# Patient Record
Sex: Male | Born: 1955
Health system: Southern US, Community
[De-identification: ages and names within clinical notes are randomized; demographics above are authoritative.]

## PROBLEM LIST (undated history)

## (undated) DIAGNOSIS — Z77090 Contact with and (suspected) exposure to asbestos: Secondary | ICD-10-CM

## (undated) DIAGNOSIS — C801 Malignant (primary) neoplasm, unspecified: Secondary | ICD-10-CM

## (undated) DIAGNOSIS — F32A Depression, unspecified: Secondary | ICD-10-CM

## (undated) DIAGNOSIS — I2510 Atherosclerotic heart disease of native coronary artery without angina pectoris: Secondary | ICD-10-CM

## (undated) DIAGNOSIS — M1990 Unspecified osteoarthritis, unspecified site: Secondary | ICD-10-CM

## (undated) DIAGNOSIS — Z87442 Personal history of urinary calculi: Secondary | ICD-10-CM

## (undated) DIAGNOSIS — K219 Gastro-esophageal reflux disease without esophagitis: Secondary | ICD-10-CM

## (undated) DIAGNOSIS — R0600 Dyspnea, unspecified: Secondary | ICD-10-CM

---

## 1988-08-16 HISTORY — PX: NECK SURGERY: SHX720

## 2000-08-16 HISTORY — PX: FRACTURE SURGERY: SHX138

## 2008-12-06 ENCOUNTER — Ambulatory Visit (HOSPITAL_COMMUNITY): Admission: RE | Admit: 2008-12-06 | Discharge: 2008-12-06 | Payer: Self-pay | Admitting: Urology

## 2010-06-28 IMAGING — RF DG RETROGRADE PYELOGRAM
1 series · 1 of 1 positions shown · non-contrast
Comparison: None available.

CLINICAL DATA: Left ureteral calculus.  Double-J stent placement.

RETROGRADE PYELOGRAM

[Series 1: run · 1 of 1 slices shown]
[im 1/1]
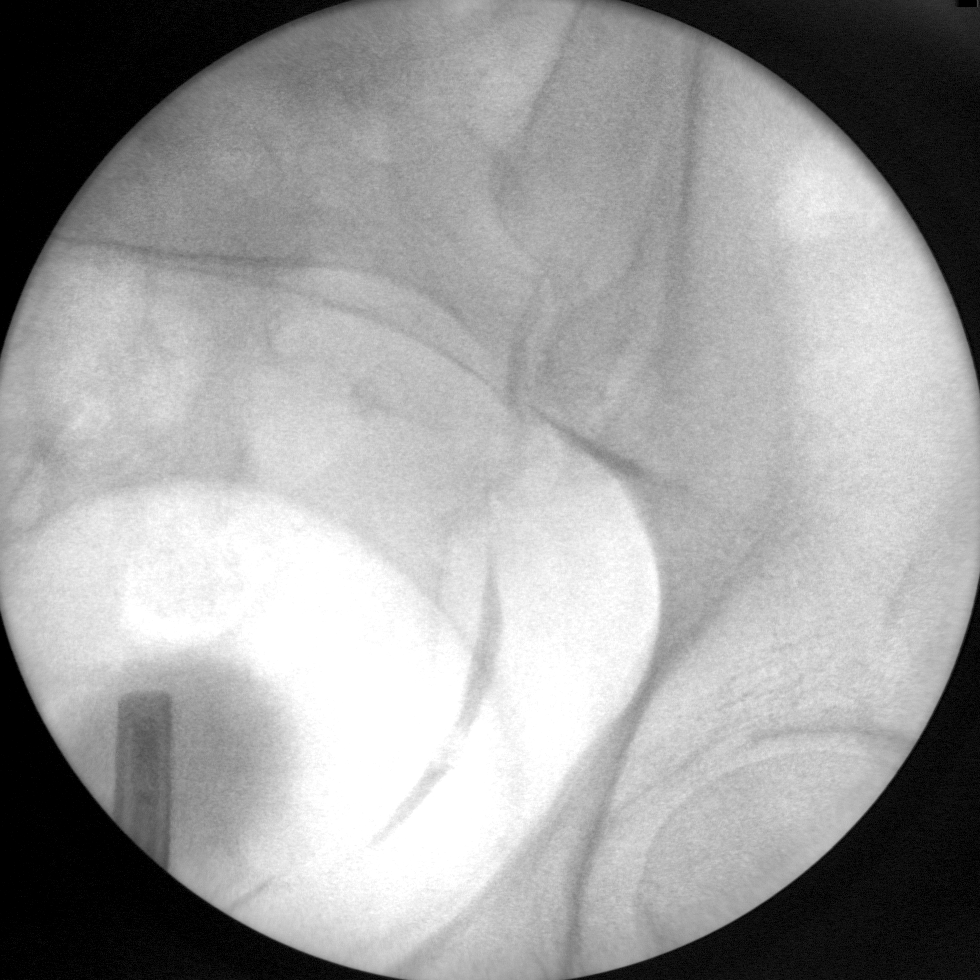

[1 of 1 positions shown; findings below may reference images not displayed]

FINDINGS: Fluoroscopic spot view from retrograde pyelogram
demonstrates a filling defect in the distal left ureter consistent
with a small stone.  Final image shows a double J left ureteral
stent in good position.  No stone is identified.
IMPRESSION: Left ureteral stone removal and stent placement as above.

## 2010-11-25 LAB — CBC
Platelets: 204 10*3/uL (ref 150–400)
RBC: 4.57 MIL/uL (ref 4.22–5.81)
WBC: 8.5 10*3/uL (ref 4.0–10.5)

## 2010-11-25 LAB — BASIC METABOLIC PANEL
BUN: 12 mg/dL (ref 6–23)
Calcium: 9.1 mg/dL (ref 8.4–10.5)
Chloride: 100 mEq/L (ref 96–112)
Creatinine, Ser: 0.75 mg/dL (ref 0.4–1.5)
GFR calc Af Amer: >60 mL/min (ref >60)
GFR calc non Af Amer: >60 mL/min (ref >60)

## 2010-12-29 NOTE — Op Note (Signed)
NAME:  Christian Davis, Christian Davis                ACCOUNT NO.:  [id number]   MEDICAL RECORD NO.:  [id number]          PATIENT TYPE:  AMB   LOCATION:  DAY                           FACILITY:  [facility]   PHYSICIAN:  Ky Barban, M.D.DATE OF BIRTH:  1956-01-30   DATE OF PROCEDURE:  12/06/2008  DATE OF DISCHARGE:                               OPERATIVE REPORT   PREOPERATIVE DIAGNOSES:  Left ureteral calculus, questionable bladder  calculus.   POSTOPERATIVE DIAGNOSES:  Left ureteral calculus, questionable bladder  calculus.   PROCEDURES:  Cystoscopy, left retrograde pyelogram, ureteroscopic stone  basket extraction, holmium laser lithotripsy, insertion of double-J  stent with string attached, and also removal of bladder calculus.   PROCEDURE IN DETAIL:  The patient under spinal anesthesia in lithotomy  position.  After usual prep and drape, #25 cystoscope was introduced  into the bladder.  It was inspected.  There was a small bladder calculus  floating around in the bladder.  Left ureteral orifice was catheterized  with a wedge catheter.  Hypaque was injected under fluoroscopic control.  Dye goes up into the upper ureter.  There was a filling defect in the  ureterovesical junction.  Now, guidewire was passed up into the renal  pelvis and over the guidewire #15 balloon dilator was inserted.  Intramural ureter was dilated to #15.  Cystoscope was removed and  ureteroscope was introduced alongside the guidewire, went to the level  of the stone.  Stone was then visualized.  I passed a basket above the  stone and now using holmium laser fiber, the stone was broken under  direct vision and then the pieces were engaged in the basket and  removed.  No complications and at the end, I removed the bladder stone  with a flexible forceps.  Cystoscope was removed and stent size 5-French  24 cm was introduced.  I left the string attached.  It was positioned  between the renal pelvis and the bladder.  Guidewire  was removed and  nice loop in the renal pelvis and bladder was obtained.  All the  instruments were removed.  The patient left the operating room in  satisfactory condition.      Ky Barban, M.D.  Electronically Signed     MIJ/MEDQ  D:  12/06/2008  T:  12/06/2008  Job:  [id number]

## 2010-12-29 NOTE — H&P (Signed)
NAME:  Christian Davis, Christian Davis                ACCOUNT NO.:  [id number]   MEDICAL RECORD NO.:  [id number]          PATIENT TYPE:  AMB   LOCATION:  DAY                           FACILITY:  [facility]   PHYSICIAN:  Ky Barban, M.D.DATE OF BIRTH:  Dec 27, 1955   DATE OF ADMISSION:  DATE OF DISCHARGE:  LH                              HISTORY & PHYSICAL   CHIEF COMPLAIN:  Recurrent left renal colic.   HISTORY:  This 55 year old gentleman came to see me because he is having  recurrent episodes of left renal colic.  He had first episode of left  renal colic 10 days ago.  He is still having pain.  He had to go to the  emergency room.  On that day, a CT scan was done, showed a 6-mm stone in  the distal left ureter and the calcification is 6-mm in size, it is  about 1.2 cm from the left ureterovesical junction.  Also, a  questionable 2.5-mm posterior bladder calcification.  The patient is  still having pain, although not very significant, but he is planning to  go out of town in a couple of weeks, he wants something done about it so  that is the only reason I stated that although the stone is on the large  side it will be difficult to pass but I have seen that size stone,  sometimes they come out if the patient waits long enough, but he does  not want to wait because he has to go out of town.  So, I have scheduled  him to undergo cystoscopy, left retrograde pyelogram, ureteroscopic  stone basket extraction.  I discussed the procedure, its limitations,  complications, especially ureteral perforation leading to open surgery  which will require longer hospitalization, longer time to recuperate but  it is not a very common problem, but it is a possibility.  He may end up  also having a double-J stent which needs to stay there for a couple of  days at least and he understands, wants me to go ahead and proceed, so  he is coming as outpatient in the morning.  Will do a stone basket under  anesthesia with holmium  laser lithotripsy.   PAST MEDICAL HISTORY:  1. No history of diabetes or hypertension.  2. Cervical spine fusion in 1999 and broken left leg 5-6 years ago.      Still has 1 broken cervical vertebrae and is having physiotherapy.      He complains of numbness in his left arm because of that.   PERSONAL HISTORY:  He does not smoke or drink.   REVIEW OF SYSTEMS:  Unremarkable.   EXAMINATION:  Blood pressure 100/67, temperature 98.7.  ABDOMEN:  Soft, flat.  Liver, spleen, kidneys not palpable.  No CVA  tenderness.  EXTERNAL GENITALIA:  Circumcised, meatus adequate.  Testicles are  normal.  RECTAL:  Normal sphincter tone.  No rectal mass.  Prostate 1+, smooth  and firm.   IMPRESSION:  Left ureteral calculus 6-mm in size, possible small 2.5-mm  stone in the bladder.   PLAN:  1. Cystoscopy.  2. Left retrograde pyelogram.  3. Ureteroscopic stone basket extraction.  If the stone is there in      the bladder I will have to remove that also.  I have discussed the      procedure, limitations, complications with the patient, he      understands.      Ky Barban, M.D.  Electronically Signed     MIJ/MEDQ  D:  12/05/2008  T:  12/05/2008  Job:  [id number]

## 2015-12-24 DIAGNOSIS — Z0000 Encounter for general adult medical examination without abnormal findings: Secondary | ICD-10-CM | POA: Diagnosis not present

## 2016-01-05 DIAGNOSIS — J300 Vasomotor rhinitis: Secondary | ICD-10-CM | POA: Diagnosis not present

## 2016-02-06 DIAGNOSIS — R0981 Nasal congestion: Secondary | ICD-10-CM | POA: Diagnosis not present

## 2016-02-06 DIAGNOSIS — F410 Panic disorder [episodic paroxysmal anxiety] without agoraphobia: Secondary | ICD-10-CM | POA: Diagnosis not present

## 2016-02-06 DIAGNOSIS — F419 Anxiety disorder, unspecified: Secondary | ICD-10-CM | POA: Diagnosis not present

## 2016-02-06 DIAGNOSIS — M791 Myalgia: Secondary | ICD-10-CM | POA: Diagnosis not present

## 2016-02-11 ENCOUNTER — Ambulatory Visit (INDEPENDENT_AMBULATORY_CARE_PROVIDER_SITE_OTHER): Payer: BLUE CROSS/BLUE SHIELD | Admitting: Family Medicine

## 2016-02-11 VITALS — BP 126/84 | Wt 247.4 lb

## 2016-02-11 DIAGNOSIS — G250 Essential tremor: Secondary | ICD-10-CM | POA: Diagnosis not present

## 2016-02-11 DIAGNOSIS — F329 Major depressive disorder, single episode, unspecified: Secondary | ICD-10-CM | POA: Diagnosis not present

## 2016-02-11 DIAGNOSIS — R0981 Nasal congestion: Secondary | ICD-10-CM | POA: Diagnosis not present

## 2016-02-11 DIAGNOSIS — F32A Depression, unspecified: Secondary | ICD-10-CM

## 2016-02-11 MED ORDER — AZITHROMYCIN 250 MG PO TABS: ORAL_TABLET | ORAL | Status: DC

## 2016-02-11 NOTE — Progress Notes (Signed)
   Subjective:    Patient ID: Christian Davis, male    DOB: 07-21-1956, 60 y.o.   MRN: [id number]  HPI Patient arrives for a ER follow up for panic attack. Patient also having problems with allergies and drainage.-using OTC nasal spray  Wakes up meddle of night, bp up, disoriented, has trouble with orient ation  Worked [employer] for thirty yrs and then injured back and had to retire   On celexa long term for this then tried to wean   Pt awoke in middle of night, couldn't sit couldn't stand, drove around   Confused, trouble with orientation, trouble with complete sentences  No tests done, offerred full work up , given test to [employer] hx of dementia in the family, fairly strong, some get it, some do not  troubl ith memeory for the lsast six to eight months  Told other do c about it  Long hx of allergies and ent issues, sees specialist for it, chronic nasal vong and xtuffiness. Has dx of asbestosis, gets pft s and studies to ck this, hx of allergy shots, took numerous allergy shots  In the past. rec considering dr Jefferson Fuel an ent   Now on otc nasocort and allegra prn, on occasion  Patient has been on generic Celexa for some time. Has more difficulty with anxiety and nerves during the day. Also notes challenges with increased memory issues. Was advised by his last family doctor not to worry about it is getting older  Also has history of tremor. Felt to be essential tremor by his prior neurosurgeon. Next  History of neck surgery in the past.    Review of Systems No headache no chest pain no back pain no abdominal pain no change bowel habits    Objective:   Physical Exam  Alert vital stable HET moderate nasal congestion TMs good pharynx normal neck supple. Lungs clear heart rare rhythm slight tremor noted.      Assessment & Plan:  Impression 1 chronic nasal congestion history of ENT surgical intervention was steroid injection of the turbinates. #2 sleep disturbance with  unusual somnambulism type episodes sporadically once per year or so. #3 chronic anxiety, depression #4 worsening short-term memory #5 probable essential tremor plan ENT referral per patient request. Follow-up in a couple weeks. We'll do an MMSE along wit sets pulmonary issue not be happy to go on that yet R forth interactions and I h neurological exam at that point and then try to figure some this out WSL  C

## 2016-02-20 ENCOUNTER — Ambulatory Visit (INDEPENDENT_AMBULATORY_CARE_PROVIDER_SITE_OTHER): Payer: BLUE CROSS/BLUE SHIELD | Admitting: Family Medicine

## 2016-02-20 ENCOUNTER — Encounter: Payer: Self-pay | Admitting: Family Medicine

## 2016-02-20 VITALS — BP 114/74 | Ht 72.0 in | Wt 246.0 lb

## 2016-02-20 DIAGNOSIS — R0981 Nasal congestion: Secondary | ICD-10-CM | POA: Diagnosis not present

## 2016-02-20 DIAGNOSIS — R413 Other amnesia: Secondary | ICD-10-CM | POA: Insufficient documentation

## 2016-02-20 DIAGNOSIS — G250 Essential tremor: Secondary | ICD-10-CM | POA: Diagnosis not present

## 2016-02-20 DIAGNOSIS — F514 Sleep terrors [night terrors]: Secondary | ICD-10-CM | POA: Diagnosis not present

## 2016-02-20 MED ORDER — CITALOPRAM HYDROBROMIDE 20 MG PO TABS: 20.0000 mg | ORAL_TABLET | Freq: Every day | ORAL | Status: DC

## 2016-02-20 NOTE — Progress Notes (Signed)
   Subjective:    Patient ID: Christian Davis, male    DOB: 20-Apr-1956, 60 y.o.   MRN: [id number]  Depression        This is a new problem.  The current episode started more than 1 month ago.    Patient in today for a follow up for depression, and chronic anxiety.   Mos sode has all had [place] including mother  All siblings  In eighties with siblings.. Next  Patient also concerned about tremor. Progressive in nature last 20 years. Others in the family get it. In fact father was thought to possibly have Parkinson's for many years. In the and did not have Parkinson's.  Patient notes intermittent spells. Once per year so. Night tear-like in nature. Does not fully awaken. Hard to control. Told in the past through emergency room that he had "panic attacks"  States no other concerns this visit.  MMSE completed. Scored 29 out of 30  Review of Systems  Psychiatric/Behavioral: Positive for depression.  No headache no chest pain no abdominal pain     Objective:   Physical Exam  Alert vital stable HET normal lungs clear heart regular in rhythm neuro exam consistent with essential tremor more no cerebellar findings. Slight quivering of the jaw. Distinct evidence and hands. Oriented 3 MMSE 29 out of 30 as noted      Assessment & Plan:  Impression 1 short-term memory loss was 0 evidence of early dementia and discussed at length #2 essential tremor with potential for medicine the future discussed at length #3 depression clinically stable #4 sleep disturbance discussed plan maintain same meds diet exercise discussed recheck in 6 months WSL

## 2016-04-27 DIAGNOSIS — D225 Melanocytic nevi of trunk: Secondary | ICD-10-CM | POA: Diagnosis not present

## 2016-04-27 DIAGNOSIS — L820 Inflamed seborrheic keratosis: Secondary | ICD-10-CM | POA: Diagnosis not present

## 2016-04-27 DIAGNOSIS — X32XXXD Exposure to sunlight, subsequent encounter: Secondary | ICD-10-CM | POA: Diagnosis not present

## 2016-04-27 DIAGNOSIS — Z1283 Encounter for screening for malignant neoplasm of skin: Secondary | ICD-10-CM | POA: Diagnosis not present

## 2016-04-27 DIAGNOSIS — L570 Actinic keratosis: Secondary | ICD-10-CM | POA: Diagnosis not present

## 2016-05-25 DIAGNOSIS — Z23 Encounter for immunization: Secondary | ICD-10-CM | POA: Diagnosis not present

## 2016-08-05 ENCOUNTER — Other Ambulatory Visit: Payer: Self-pay | Admitting: Family Medicine

## 2016-08-23 ENCOUNTER — Ambulatory Visit (INDEPENDENT_AMBULATORY_CARE_PROVIDER_SITE_OTHER): Payer: BLUE CROSS/BLUE SHIELD | Admitting: Family Medicine

## 2016-08-23 ENCOUNTER — Encounter: Payer: Self-pay | Admitting: Family Medicine

## 2016-08-23 VITALS — BP 130/78 | Ht 72.0 in | Wt 254.4 lb

## 2016-08-23 DIAGNOSIS — Z125 Encounter for screening for malignant neoplasm of prostate: Secondary | ICD-10-CM

## 2016-08-23 DIAGNOSIS — F321 Major depressive disorder, single episode, moderate: Secondary | ICD-10-CM | POA: Diagnosis not present

## 2016-08-23 DIAGNOSIS — Z79899 Other long term (current) drug therapy: Secondary | ICD-10-CM | POA: Diagnosis not present

## 2016-08-23 DIAGNOSIS — Z13220 Encounter for screening for lipoid disorders: Secondary | ICD-10-CM

## 2016-08-23 DIAGNOSIS — R413 Other amnesia: Secondary | ICD-10-CM

## 2016-08-23 MED ORDER — CITALOPRAM HYDROBROMIDE 20 MG PO TABS: 20.0000 mg | ORAL_TABLET | Freq: Every day | ORAL | 5 refills | Status: DC

## 2016-08-23 NOTE — Progress Notes (Signed)
   Subjective:    Patient ID: Christian Davis, male    DOB: [birth year], 61 y.o.   MRN: [id number]  HPI  Patient arrives for follow up on depression.def helping as far as tamping doewn anxiety and  Worked in up to four yrs ago , sustained back injury and unable to work at that time  Was sent to ortho, who wanted to operate   Had three fusions in the neck, via dr elsner Patient reports no problems or concerns.   Review of Systems No headache, no major weight loss or weight gain, no chest pain no back pain abdominal pain no change in bowel habits complete ROS otherwise negative     Objective:   Physical Exam  Alert vitals stable, NAD. Blood pressure good on repeat. HEENT normal. Lungs clear. Heart regular rate and rhythm.       Assessment & Plan:  Impression depression/anxiety clinically stable at this time. Discussed plan maintain same meds. Exercise strongly encourage. Recheck in several months for blood work plus wellness exam. Old records request sent to Dr. Scotty Court

## 2016-09-29 ENCOUNTER — Other Ambulatory Visit: Payer: Self-pay | Admitting: Family Medicine

## 2016-11-01 DIAGNOSIS — C44219 Basal cell carcinoma of skin of left ear and external auricular canal: Secondary | ICD-10-CM | POA: Diagnosis not present

## 2016-11-01 DIAGNOSIS — L570 Actinic keratosis: Secondary | ICD-10-CM | POA: Diagnosis not present

## 2016-11-01 DIAGNOSIS — Z1283 Encounter for screening for malignant neoplasm of skin: Secondary | ICD-10-CM | POA: Diagnosis not present

## 2016-11-01 DIAGNOSIS — X32XXXD Exposure to sunlight, subsequent encounter: Secondary | ICD-10-CM | POA: Diagnosis not present

## 2016-11-01 DIAGNOSIS — D225 Melanocytic nevi of trunk: Secondary | ICD-10-CM | POA: Diagnosis not present

## 2016-11-09 DIAGNOSIS — Z79899 Other long term (current) drug therapy: Secondary | ICD-10-CM | POA: Diagnosis not present

## 2016-11-09 DIAGNOSIS — Z13220 Encounter for screening for lipoid disorders: Secondary | ICD-10-CM | POA: Diagnosis not present

## 2016-11-09 DIAGNOSIS — Z125 Encounter for screening for malignant neoplasm of prostate: Secondary | ICD-10-CM | POA: Diagnosis not present

## 2016-11-10 LAB — BASIC METABOLIC PANEL
BUN / CREAT RATIO: 11 (ref 9–20)
BUN: 9 mg/dL (ref 6–24)
CALCIUM: 9.4 mg/dL (ref 8.7–10.2)
CO2: 27 mmol/L (ref 18–29)
CREATININE: 0.79 mg/dL (ref 0.76–1.27)
Chloride: 97 mmol/L (ref 96–106)
GFR calc non Af Amer: 98 mL/min/{1.73_m2} (ref >59)
GFR, EST AFRICAN AMERICAN: 114 mL/min/{1.73_m2} (ref >59)
Glucose: 87 mg/dL (ref 65–99)
Potassium: 5.4 mmol/L — ABNORMAL HIGH (ref 3.5–5.2)
Sodium: 136 mmol/L (ref 134–144)

## 2016-11-10 LAB — HEPATIC FUNCTION PANEL
ALBUMIN: 4.3 g/dL (ref 3.5–5.5)
ALT: 16 IU/L (ref 0–44)
AST: 19 IU/L (ref 0–40)
Alkaline Phosphatase: 70 IU/L (ref 39–117)
BILIRUBIN TOTAL: 0.7 mg/dL (ref 0.0–1.2)
BILIRUBIN, DIRECT: 0.17 mg/dL (ref 0.00–0.40)
TOTAL PROTEIN: 6.7 g/dL (ref 6.0–8.5)

## 2016-11-10 LAB — LIPID PANEL
CHOL/HDL RATIO: 3.9 ratio (ref 0.0–5.0)
Cholesterol, Total: 225 mg/dL — ABNORMAL HIGH (ref 100–199)
HDL: 58 mg/dL (ref >39)
LDL CALC: 148 mg/dL — AB (ref 0–99)
Triglycerides: 97 mg/dL (ref 0–149)
VLDL Cholesterol Cal: 19 mg/dL (ref 5–40)

## 2016-11-10 LAB — PSA: Prostate Specific Ag, Serum: 0.4 ng/mL (ref 0.0–4.0)

## 2016-11-22 ENCOUNTER — Encounter: Payer: Self-pay | Admitting: Family Medicine

## 2016-11-22 ENCOUNTER — Ambulatory Visit (INDEPENDENT_AMBULATORY_CARE_PROVIDER_SITE_OTHER): Payer: BLUE CROSS/BLUE SHIELD | Admitting: Family Medicine

## 2016-11-22 VITALS — BP 116/74 | Ht 71.0 in | Wt 249.0 lb

## 2016-11-22 DIAGNOSIS — Z0000 Encounter for general adult medical examination without abnormal findings: Secondary | ICD-10-CM | POA: Diagnosis not present

## 2016-11-22 MED ORDER — CITALOPRAM HYDROBROMIDE 20 MG PO TABS: 20.0000 mg | ORAL_TABLET | Freq: Every day | ORAL | 5 refills | Status: DC

## 2016-11-22 NOTE — Progress Notes (Signed)
   Subjective:    Patient ID: Christian Davis, male    DOB: 1955/10/15, 61 y.o.   MRN: [id number]  HPI The patient comes in today for a wellness visit.    A review of their health history was completed.  A review of medications was also completed.  Any needed refills; none  Eating habits: not health conscious  Falls/  MVA accidents in past few months: none  Regular exercise: none  Specialist pt sees on regular basis:   Preventative health issues were discussed.   Additional concerns: none    Uses nasocort uses nascort  Every day,24 hr antiheistamine   Pt went to see an allergy doc in [place], took shots for awhile, and last time ck'ed not allergic to anything  Last colonoscopy done around age 49., needs colon.  Flu shots yrly done  Due this yr for shingles   Pt states eating a far amnt of fast food etc.  Done at [place]  Constitutional: Negative for activity change, appetite change and fever.  HENT: Negative for congestion and rhinorrhea.   Eyes: Negative for discharge.  Respiratory: Negative for cough and wheezing.   Cardiovascular: Negative for chest pain.  Gastrointestinal: Negative for abdominal pain, blood in stool and vomiting.  Genitourinary: Negative for difficulty urinating and frequency.  Musculoskeletal: Negative for neck pain.  Skin: Negative for rash.  Allergic/Immunologic: Negative for environmental allergies and food allergies.  Neurological: Negative for weakness and headaches.  Psychiatric/Behavioral: Negative for agitation.  All other systems reviewed and are negative.      Objective:   Physical Exam  Constitutional: He appears well-developed and well-nourished.  HENT:  Head: Normocephalic and atraumatic.  Right Ear: External ear normal.  Left Ear: External ear normal.  Nose: Nose normal.  Mouth/Throat: Oropharynx is clear and moist.  Eyes: EOM are normal. Pupils are equal, round, and reactive to light.  Neck: Normal  range of motion. Neck supple. No thyromegaly present.  Cardiovascular: Normal rate, regular rhythm and normal heart sounds.   No murmur heard. Findings essential tremor noted  Pulmonary/Chest: Effort normal and breath sounds normal. No respiratory distress. He has no wheezes.  Abdominal: Soft. Bowel sounds are normal. He exhibits no distension and no mass. There is no tenderness.  Genitourinary: Penis normal.  Musculoskeletal: Normal range of motion. He exhibits no edema.  Lymphadenopathy:    He has no cervical adenopathy.  Neurological: He is alert. He exhibits normal muscle tone.  Skin: Skin is warm and dry. No erythema.  Psychiatric: He has a normal mood and affect. His behavior is normal. Judgment normal.  Vitals reviewed.         Assessment & Plan:  Impression wellness exam. Diet discussed. Exercise is minimal discussed in encourage. Up to date on colonoscopy. Up to date on vaccinations. Shingles vaccine prescribed. #2 short-term memory loss patient notes still a challenge for him plan blood work discussed. Diet exercise discussed. Request for old records once again from Dr. Rayna Sexton office. Recheck in 6 months for chronic problems

## 2016-11-29 DIAGNOSIS — C44219 Basal cell carcinoma of skin of left ear and external auricular canal: Secondary | ICD-10-CM | POA: Diagnosis not present

## 2017-04-25 ENCOUNTER — Encounter: Payer: Self-pay | Admitting: Family Medicine

## 2017-04-25 ENCOUNTER — Ambulatory Visit (INDEPENDENT_AMBULATORY_CARE_PROVIDER_SITE_OTHER): Payer: BLUE CROSS/BLUE SHIELD | Admitting: Family Medicine

## 2017-04-25 VITALS — BP 136/88 | Ht 71.0 in | Wt 256.0 lb

## 2017-04-25 DIAGNOSIS — F411 Generalized anxiety disorder: Secondary | ICD-10-CM

## 2017-04-25 DIAGNOSIS — G479 Sleep disorder, unspecified: Secondary | ICD-10-CM

## 2017-04-25 DIAGNOSIS — G4730 Sleep apnea, unspecified: Secondary | ICD-10-CM

## 2017-04-25 DIAGNOSIS — R5383 Other fatigue: Secondary | ICD-10-CM | POA: Diagnosis not present

## 2017-04-25 MED ORDER — CITALOPRAM HYDROBROMIDE 20 MG PO TABS: 20.0000 mg | ORAL_TABLET | Freq: Every day | ORAL | 5 refills | Status: DC

## 2017-04-25 NOTE — Progress Notes (Signed)
   Subjective:    Patient ID: Christian Davis, male    DOB: 01/26/1956, 61 y.o.   MRN: [id number]  Depression         This is a recurrent problem.  Patient is currently on Celexa 20 mg one daily.Patient states he is not depressed. He states he has night terrors. He was give PhQ 9 to fill out.  Patient also notes a tendency towards chronic anxiety. Some family history of this to with irritability and his father. Claims no thoughts of depression. No thoughts of self-harm.  His no longer had any further night tear like spells while on Celexa.  Reports ongoing challenges with snoring. Seems to be getting worse. Accompanied by substantial daytime fatigue. Can go to sleep and a drop of the head anywhere. Almost had a sleep study in the past but declined it when he felt the ENT Dr. Redmond Pulling was not treating him appropriately      Review of Systems  Psychiatric/Behavioral: Positive for depression.       Objective:   Physical Exam  Alert and oriented, vitals reviewed and stable, NAD ENT-TM's and ext canals WNL bilat via otoscopic exam Soft palate, tonsils and post pharynx WNL via oropharyngeal exam Neck-symmetric, no masses; thyroid nonpalpable and nontender Pulmonary-no tachypnea or accessory muscle use; Clear without wheezes via auscultation Card--no abnrml murmurs, rhythm reg and rate WNL Carotid pulses symmetric, without bruits  Berlin score sleep questionnaire positive for 2 or greater and 2 out of 3 criteria. This suggests substantial potential for sleep apnea     Assessment & Plan:  Impression #1 nocturnal sleep disorder with sleep tears along with element of generalized anxiety. Celexa helps. Discussed will maintain  probable sleep apnea with substantial daytime drowsiness and fatigue. Significant snoring at night also. Progressive in nature. Plan we'll work on sleep study  Greater than 50% of this 25 minute face to face visit was spent in counseling and discussion and  coordination of care regarding the above diagnosis/diagnosies

## 2017-04-26 ENCOUNTER — Telehealth: Payer: Self-pay | Admitting: Family Medicine

## 2017-04-26 NOTE — Telephone Encounter (Signed)
Pt's [employer] states he does not meet medical criteria for an in-lab sleep study.  [employer] recommends Home Sleep Test based on clinical information given  Case requires further clinical information - can do a call for peer to peer within 24 hours [phone]  Please advise - order Home Sleep Test or do peer-to-peer

## 2017-04-26 NOTE — Telephone Encounter (Signed)
Please sign & date order for Home Sleep Test & forward to me to be sent to sleep lab  In green folder in Yellow box

## 2017-04-26 NOTE — Telephone Encounter (Signed)
Tell pt his insur I refusing to do sleep study and inisiting on home study first, this is not ideal but this is what his insur is insisting upon, then letsdo

## 2017-04-27 ENCOUNTER — Encounter: Payer: Self-pay | Admitting: Family Medicine

## 2017-05-16 ENCOUNTER — Ambulatory Visit: Payer: BLUE CROSS/BLUE SHIELD | Attending: Family Medicine | Admitting: Neurology

## 2017-05-16 DIAGNOSIS — G4733 Obstructive sleep apnea (adult) (pediatric): Secondary | ICD-10-CM | POA: Insufficient documentation

## 2017-05-16 DIAGNOSIS — R5383 Other fatigue: Secondary | ICD-10-CM | POA: Diagnosis not present

## 2017-05-16 DIAGNOSIS — R0683 Snoring: Secondary | ICD-10-CM | POA: Diagnosis not present

## 2017-05-16 DIAGNOSIS — G4710 Hypersomnia, unspecified: Secondary | ICD-10-CM | POA: Diagnosis not present

## 2017-05-21 NOTE — Procedures (Signed)
   Homeland A. Merlene Laughter, MD     [email or web address]             HOME SLEEP TEST  LOCATION: [facility]  Patient Name: Christian Davis, Christian Davis Date: 05/16/2017 Gender: Male D.O.B: 1956/02/03 Age (years): 61 Referring Provider: Sallee Lange Height (inches): 71 Interpreting Physician: Phillips Odor MD, ABSM Weight (lbs): 256 RPSGT: Rosebud Poles BMI: 36 MRN: [id number] Neck Size: CLINICAL INFORMATION Sleep Study Type: HST  Indication for sleep study: Daytime Fatigue, Snoring  Epworth Sleepiness Score: NA  SLEEP STUDY TECHNIQUE A multi-channel overnight portable sleep study was performed. The channels recorded were: nasal airflow, thoracic respiratory movement, and oxygen saturation with a pulse oximetry. Snoring was also monitored.  MEDICATIONS Patient self administered medications include: N/A.  Current Outpatient Prescriptions:  .  citalopram (CELEXA) 20 MG tablet, Take 1 tablet (20 mg total) by mouth daily., Disp: 30 tablet, Rfl: 5   SLEEP ARCHITECTURE Patient was studied for 460.5 minutes. The sleep efficiency was 99.5 % and the patient was supine for 12.3%. The arousal index was 0.0 per hour.  RESPIRATORY PARAMETERS The overall AHI was 6.4 per hour, with a central apnea index of 0.9 per hour.  The oxygen nadir was 89% during sleep.  CARDIAC DATA Mean heart rate during sleep was 60.8 bpm.  IMPRESSIONS - Mild obstructive sleep apnea occurred but the severity does du not require positive pressure treatment.     Delano Metz, MD Diplomate, American Board of Sleep Medicine.  ELECTRONICALLY SIGNED ON:  05/21/2017, 10:55 AM [facility] PH: [phone]   FX: [phone] [place]

## 2017-06-07 ENCOUNTER — Telehealth: Payer: Self-pay | Admitting: Family Medicine

## 2017-06-07 NOTE — Telephone Encounter (Signed)
Sleep study revealed only mild sleep apnea with six events per hour of breathing slowing or stoppping momentarily. This is not enough according to the experts to initate CPAP therapy. Can repeat this in a couple yrs if symptoms gradually worsen

## 2017-06-07 NOTE — Telephone Encounter (Signed)
Pt calling to get sleep study results  Results are in Notes tab in chart review  Please advise

## 2017-06-08 NOTE — Telephone Encounter (Signed)
Left message to return call 

## 2017-06-08 NOTE — Telephone Encounter (Signed)
Results discussed with patient. Patient advised Sleep study revealed only mild sleep apnea with six events per hour of breathing slowing or stoppping momentarily. This is not enough according to the experts to initate CPAP therapy. Can repeat this in a couple yrs if symptoms gradually worsen. Patient verbalized understanding.

## 2017-08-18 ENCOUNTER — Other Ambulatory Visit: Payer: Self-pay | Admitting: *Deleted

## 2017-08-18 MED ORDER — CITALOPRAM HYDROBROMIDE 20 MG PO TABS: 20.0000 mg | ORAL_TABLET | Freq: Every day | ORAL | 0 refills | Status: DC

## 2017-08-18 NOTE — Telephone Encounter (Signed)
Ok times 90 d rx

## 2017-09-26 LAB — PULMONARY FUNCTION TEST
DLCO: 36.6 ml/mmHg sec
FEV1/FVC: 78 %
FEV1: 3.92 L
FVC: 5.05 L
TLC: 7.41

## 2017-10-10 ENCOUNTER — Ambulatory Visit: Payer: BLUE CROSS/BLUE SHIELD | Admitting: Family Medicine

## 2017-10-10 ENCOUNTER — Encounter: Payer: Self-pay | Admitting: Family Medicine

## 2017-10-10 VITALS — BP 122/82 | Temp 97.8°F | Ht 71.0 in | Wt 249.0 lb

## 2017-10-10 DIAGNOSIS — J329 Chronic sinusitis, unspecified: Secondary | ICD-10-CM

## 2017-10-10 DIAGNOSIS — J310 Chronic rhinitis: Secondary | ICD-10-CM

## 2017-10-10 DIAGNOSIS — J4521 Mild intermittent asthma with (acute) exacerbation: Secondary | ICD-10-CM | POA: Diagnosis not present

## 2017-10-10 MED ORDER — CEFPROZIL 500 MG PO TABS: 500.0000 mg | ORAL_TABLET | Freq: Two times a day (BID) | ORAL | 0 refills | Status: DC

## 2017-10-10 MED ORDER — ALBUTEROL SULFATE HFA 108 (90 BASE) MCG/ACT IN AERS: 2.0000 | INHALATION_SPRAY | Freq: Four times a day (QID) | RESPIRATORY_TRACT | 2 refills | Status: DC | PRN

## 2017-10-10 NOTE — Progress Notes (Signed)
   Subjective:    Patient ID: Christian Davis, male    DOB: 30-Aug-1955, 62 y.o.   MRN: [id number]  Cough  This is a new problem. The current episode started in the past 7 days. Associated symptoms include headaches, nasal congestion, a sore throat and wheezing.   Got to feeling bad thur or frid  Head hurt sidg ranage  Coughing  Cough off a on     Left frontal     Hit hard progresse diwth in 48 hrs  Energy now below normal     Dim appetite       Review of Systems  HENT: Positive for sore throat.   Respiratory: Positive for cough and wheezing.   Neurological: Positive for headaches.       Objective:   Physical Exam Alert, mild malaise. Hydration good Vitals stable. frontal/ maxillary tenderness evident positive nasal congestion. pharynx normal neck supple  lungs clear/no crackles expiration positive/bronchitis wheezes. heart regular in rhythm        Assessment & Plan:  Impression rhinosinusitis and reactive airway likely post flu likely post viral, discussed with patient. plan antibiotics prescribed. Questions answered. Symptomatic care discussed. warning signs discussed. WSL

## 2017-10-24 ENCOUNTER — Ambulatory Visit: Payer: BLUE CROSS/BLUE SHIELD | Admitting: Family Medicine

## 2017-10-24 ENCOUNTER — Encounter: Payer: Self-pay | Admitting: Family Medicine

## 2017-10-24 VITALS — BP 128/70 | Temp 98.3°F | Ht 71.0 in | Wt 252.0 lb

## 2017-10-24 DIAGNOSIS — R5383 Other fatigue: Secondary | ICD-10-CM

## 2017-10-24 DIAGNOSIS — E785 Hyperlipidemia, unspecified: Secondary | ICD-10-CM

## 2017-10-24 DIAGNOSIS — Z125 Encounter for screening for malignant neoplasm of prostate: Secondary | ICD-10-CM | POA: Diagnosis not present

## 2017-10-24 MED ORDER — CITALOPRAM HYDROBROMIDE 20 MG PO TABS: 20.0000 mg | ORAL_TABLET | Freq: Every day | ORAL | 1 refills | Status: DC

## 2017-10-24 NOTE — Progress Notes (Signed)
   Subjective:    Patient ID: Christian Davis, male    DOB: 1956-02-07, 62 y.o.   MRN: [id number]  Depression         This is a chronic problem.  The current episode started more than 1 year ago.   Treatments tried: celexa.  Finished cefzil and still having cough , generally non productive  Walking daily    Notes some dim   Effects   Now getting out ot [employer]  retireed form [employer]  Lives with wife , getting along ok , still  gdoing well together  Patient notes general fatigue.  Not as motivated to do things as he once did in the past.  No suicidal thoughts.  Impression 1 generalized anxiety disorder   Nose drainiang and running and fatigue.   Review of Systems  Psychiatric/Behavioral: Positive for depression.       Objective:   Physical Exam   Alert and oriented, vitals reviewed and stable, NAD ENT-TM's and ext canals WNL bilat via otoscopic exam Soft palate, tonsils and post pharynx WNL via oropharyngeal exam Neck-symmetric, no masses; thyroid nonpalpable and nontender Pulmonary-no tachypnea or accessory muscle use; Clear without wheezes via auscultation Card--no abnrml murmurs, rhythm reg and rate WNL Carotid pulses symmetric, without bruits      Assessment & Plan:  Impression #1 generalized anxiety disorder discussed.  Now with an element of depression.  Maintain soft  Chronic nasal congestion discussed.  Plan medications refilled diet exercise discussed and encouraged.  Patient encouraged to get out and participate more in the community.  Now is back to working part-time this may help some.  Appropriate blood work.  Recheck in 6 months.

## 2017-10-24 NOTE — Progress Notes (Signed)
   Subjective:    Patient ID: Christian Davis, male    DOB: July 13, 1956, 62 y.o.   MRN: [id number]  HPI    Review of Systems     Objective:   Physical Exam        Assessment & Plan:

## 2017-10-26 DIAGNOSIS — R5383 Other fatigue: Secondary | ICD-10-CM | POA: Diagnosis not present

## 2017-10-26 DIAGNOSIS — Z125 Encounter for screening for malignant neoplasm of prostate: Secondary | ICD-10-CM | POA: Diagnosis not present

## 2017-10-26 DIAGNOSIS — E785 Hyperlipidemia, unspecified: Secondary | ICD-10-CM | POA: Diagnosis not present

## 2017-10-27 LAB — CBC WITH DIFFERENTIAL/PLATELET
BASOS ABS: 0 10*3/uL (ref 0.0–0.2)
Basos: 0 %
EOS (ABSOLUTE): 0.1 10*3/uL (ref 0.0–0.4)
Eos: 1 %
Hematocrit: 44.3 % (ref 37.5–51.0)
Hemoglobin: 15.1 g/dL (ref 13.0–17.7)
IMMATURE GRANULOCYTES: 0 %
Immature Grans (Abs): 0 10*3/uL (ref 0.0–0.1)
LYMPHS ABS: 2.9 10*3/uL (ref 0.7–3.1)
Lymphs: 34 %
MCH: 30.8 pg (ref 26.6–33.0)
MCHC: 34.1 g/dL (ref 31.5–35.7)
MCV: 90 fL (ref 79–97)
MONOS ABS: 0.6 10*3/uL (ref 0.1–0.9)
Monocytes: 7 %
NEUTROS PCT: 58 %
Neutrophils Absolute: 5.1 10*3/uL (ref 1.4–7.0)
PLATELETS: 266 10*3/uL (ref 150–379)
RBC: 4.91 x10E6/uL (ref 4.14–5.80)
RDW: 13.8 % (ref 12.3–15.4)
WBC: 8.7 10*3/uL (ref 3.4–10.8)

## 2017-10-27 LAB — BASIC METABOLIC PANEL
BUN / CREAT RATIO: 17 (ref 10–24)
BUN: 13 mg/dL (ref 8–27)
CHLORIDE: 103 mmol/L (ref 96–106)
CO2: 25 mmol/L (ref 20–29)
CREATININE: 0.75 mg/dL — AB (ref 0.76–1.27)
Calcium: 8.8 mg/dL (ref 8.6–10.2)
GFR calc Af Amer: 115 mL/min/{1.73_m2} (ref >59)
GFR calc non Af Amer: 100 mL/min/{1.73_m2} (ref >59)
GLUCOSE: 98 mg/dL (ref 65–99)
Potassium: 5.1 mmol/L (ref 3.5–5.2)
SODIUM: 141 mmol/L (ref 134–144)

## 2017-10-27 LAB — LIPID PANEL
CHOL/HDL RATIO: 3.8 ratio (ref 0.0–5.0)
Cholesterol, Total: 192 mg/dL (ref 100–199)
HDL: 50 mg/dL (ref >39)
LDL Calculated: 127 mg/dL — ABNORMAL HIGH (ref 0–99)
TRIGLYCERIDES: 74 mg/dL (ref 0–149)
VLDL CHOLESTEROL CAL: 15 mg/dL (ref 5–40)

## 2017-10-27 LAB — HEPATIC FUNCTION PANEL
ALK PHOS: 52 IU/L (ref 39–117)
ALT: 21 IU/L (ref 0–44)
AST: 20 IU/L (ref 0–40)
Albumin: 4 g/dL (ref 3.6–4.8)
Bilirubin Total: 0.6 mg/dL (ref 0.0–1.2)
Bilirubin, Direct: 0.16 mg/dL (ref 0.00–0.40)
TOTAL PROTEIN: 6.4 g/dL (ref 6.0–8.5)

## 2017-10-27 LAB — PSA: PROSTATE SPECIFIC AG, SERUM: 0.4 ng/mL (ref 0.0–4.0)

## 2017-11-02 ENCOUNTER — Encounter: Payer: Self-pay | Admitting: Family Medicine

## 2017-12-19 ENCOUNTER — Encounter: Payer: Self-pay | Admitting: Family Medicine

## 2017-12-19 ENCOUNTER — Ambulatory Visit: Payer: BLUE CROSS/BLUE SHIELD | Admitting: Family Medicine

## 2017-12-19 VITALS — BP 110/80 | Temp 98.4°F | Ht 71.0 in | Wt 246.0 lb

## 2017-12-19 DIAGNOSIS — J329 Chronic sinusitis, unspecified: Secondary | ICD-10-CM | POA: Diagnosis not present

## 2017-12-19 DIAGNOSIS — J4521 Mild intermittent asthma with (acute) exacerbation: Secondary | ICD-10-CM | POA: Diagnosis not present

## 2017-12-19 MED ORDER — AMOXICILLIN-POT CLAVULANATE 875-125 MG PO TABS: ORAL_TABLET | ORAL | 0 refills | Status: DC

## 2017-12-19 MED ORDER — ALBUTEROL SULFATE HFA 108 (90 BASE) MCG/ACT IN AERS: 2.0000 | INHALATION_SPRAY | Freq: Four times a day (QID) | RESPIRATORY_TRACT | 2 refills | Status: DC | PRN

## 2017-12-19 NOTE — Progress Notes (Signed)
   Subjective:    Patient ID: Christian Davis, male    DOB: 03-19-1956, 62 y.o.   MRN: [id number]  Sinusitis  This is a new problem. Episode onset: 4 days. Associated symptoms include congestion, coughing, headaches and a sore throat. Treatments tried: otc meds.   Hit on thur with it  By Friday wa noticing a lot of dranage   Ears popping  Kept on with ususal activities  Over the weekend got better  Last night developed bad headace, pos pressure , frontal , cough not prduct   No fever   nots wheezing again    Review of Systems  HENT: Positive for congestion and sore throat.   Respiratory: Positive for cough.   Neurological: Positive for headaches.       Objective:   Physical Exam Alert, mild malaise. Hydration good Vitals stable. frontal/ maxillary tenderness evident positive nasal congestion. pharynx normal neck supple  lungs clear/no crackles or wheezes. heart regular in rhythm        Assessment & Plan:  Impression rhinosinusitis/bronchitis with reactive airway likely post viral, discussed with patient. plan antibiotics prescribed. Questions answered. Symptomatic care discussed. warning signs discussed.  This year was the first time patient had any challenges with reactive airways.  He notes he does have a history of asbestosis followed by specialist.  WSL

## 2018-02-20 DIAGNOSIS — C44319 Basal cell carcinoma of skin of other parts of face: Secondary | ICD-10-CM | POA: Diagnosis not present

## 2018-02-20 DIAGNOSIS — L570 Actinic keratosis: Secondary | ICD-10-CM | POA: Diagnosis not present

## 2018-02-20 DIAGNOSIS — X32XXXD Exposure to sunlight, subsequent encounter: Secondary | ICD-10-CM | POA: Diagnosis not present

## 2018-03-20 DIAGNOSIS — L570 Actinic keratosis: Secondary | ICD-10-CM | POA: Diagnosis not present

## 2018-03-20 DIAGNOSIS — X32XXXD Exposure to sunlight, subsequent encounter: Secondary | ICD-10-CM | POA: Diagnosis not present

## 2018-03-20 DIAGNOSIS — Z08 Encounter for follow-up examination after completed treatment for malignant neoplasm: Secondary | ICD-10-CM | POA: Diagnosis not present

## 2018-03-20 DIAGNOSIS — Z85828 Personal history of other malignant neoplasm of skin: Secondary | ICD-10-CM | POA: Diagnosis not present

## 2018-04-25 ENCOUNTER — Encounter: Payer: BLUE CROSS/BLUE SHIELD | Admitting: Family Medicine

## 2018-05-07 ENCOUNTER — Other Ambulatory Visit: Payer: Self-pay | Admitting: Family Medicine

## 2018-05-22 ENCOUNTER — Ambulatory Visit (INDEPENDENT_AMBULATORY_CARE_PROVIDER_SITE_OTHER): Payer: BLUE CROSS/BLUE SHIELD | Admitting: Family Medicine

## 2018-05-22 ENCOUNTER — Encounter: Payer: Self-pay | Admitting: Family Medicine

## 2018-05-22 VITALS — BP 130/82 | Ht 71.0 in | Wt 245.0 lb

## 2018-05-22 DIAGNOSIS — Z0000 Encounter for general adult medical examination without abnormal findings: Secondary | ICD-10-CM | POA: Diagnosis not present

## 2018-05-22 DIAGNOSIS — F411 Generalized anxiety disorder: Secondary | ICD-10-CM | POA: Diagnosis not present

## 2018-05-22 DIAGNOSIS — G250 Essential tremor: Secondary | ICD-10-CM | POA: Diagnosis not present

## 2018-05-22 DIAGNOSIS — Z23 Encounter for immunization: Secondary | ICD-10-CM | POA: Diagnosis not present

## 2018-05-22 MED ORDER — ZOSTER VAC RECOMB ADJUVANTED 50 MCG/0.5ML IM SUSR: 0.5000 mL | Freq: Once | INTRAMUSCULAR | 1 refills | Status: AC

## 2018-05-22 MED ORDER — CITALOPRAM HYDROBROMIDE 20 MG PO TABS: 20.0000 mg | ORAL_TABLET | Freq: Every day | ORAL | 1 refills | Status: DC

## 2018-05-22 NOTE — Progress Notes (Signed)
Subjective:    Patient ID: Christian Davis, male    DOB: June 09, 1956, 62 y.o.   MRN: [id number]  HPI The patient comes in today for a wellness visit.    A review of their health history was completed.  A review of medications was also completed.  Any needed refills; Yes  Eating habits: Good  Falls/  MVA accidents in past few months: No  Regular exercise: walks some  Specialist pt sees on regular basis: No  Preventative health issues were discussed.   Additional concerns: None  Results for orders placed or performed in visit on 10/24/17  Lipid panel  Result Value Ref Range   Cholesterol, Total 192 100 - 199 mg/dL   Triglycerides 74 0 - 149 mg/dL   HDL 50 >39 mg/dL   VLDL Cholesterol Cal 15 5 - 40 mg/dL   LDL Calculated 127 (H) 0 - 99 mg/dL   Chol/HDL Ratio 3.8 0.0 - 5.0 ratio  Hepatic function panel  Result Value Ref Range   Total Protein 6.4 6.0 - 8.5 g/dL   Albumin 4.0 3.6 - 4.8 g/dL   Bilirubin Total 0.6 0.0 - 1.2 mg/dL   Bilirubin, Direct 0.16 0.00 - 0.40 mg/dL   Alkaline Phosphatase 52 39 - 117 IU/L   AST 20 0 - 40 IU/L   ALT 21 0 - 44 IU/L  Basic metabolic panel  Result Value Ref Range   Glucose 98 65 - 99 mg/dL   BUN 13 8 - 27 mg/dL   Creatinine, Ser 0.75 (L) 0.76 - 1.27 mg/dL   GFR calc non Af Amer 100 >59 mL/min/1.73   GFR calc Af Amer 115 >59 mL/min/1.73   BUN/Creatinine Ratio 17 10 - 24   Sodium 141 134 - 144 mmol/L   Potassium 5.1 3.5 - 5.2 mmol/L   Chloride 103 96 - 106 mmol/L   CO2 25 20 - 29 mmol/L   Calcium 8.8 8.6 - 10.2 mg/dL  CBC with Differential/Platelet  Result Value Ref Range   WBC 8.7 3.4 - 10.8 x10E3/uL   RBC 4.91 4.14 - 5.80 x10E6/uL   Hemoglobin 15.1 13.0 - 17.7 g/dL   Hematocrit 44.3 37.5 - 51.0 %   MCV 90 79 - 97 fL   MCH 30.8 26.6 - 33.0 pg   MCHC 34.1 31.5 - 35.7 g/dL   RDW 13.8 12.3 - 15.4 %   Platelets 266 150 - 379 x10E3/uL   Neutrophils 58 Not Estab. %   Lymphs 34 Not Estab. %   Monocytes 7 Not Estab. %   Eos 1  Not Estab. %   Basos 0 Not Estab. %   Neutrophils Absolute 5.1 1.4 - 7.0 x10E3/uL   Lymphocytes Absolute 2.9 0.7 - 3.1 x10E3/uL   Monocytes Absolute 0.6 0.1 - 0.9 x10E3/uL   EOS (ABSOLUTE) 0.1 0.0 - 0.4 x10E3/uL   Basophils Absolute 0.0 0.0 - 0.2 x10E3/uL   Immature Granulocytes 0 Not Estab. %   Immature Grans (Abs) 0.0 0.0 - 0.1 x10E3/uL  PSA  Result Value Ref Range   Prostate Specific Ag, Serum 0.4 0.0 - 4.0 ng/mL   No use of inhaler lately  Using claritin prn, has a part time job,   celexa overall helping the anxiety, no obvious side effects.  Compliant with medications.  Continues to have essential tremor.  Wondering if CBD oil may be helpful.  Has been told by the case.  States overall it helps ,  Flu shot just given  Review of Systems  Constitutional: Negative for activity change, appetite change and fever.  HENT: Negative for congestion and rhinorrhea.   Eyes: Negative for discharge.  Respiratory: Negative for cough and wheezing.   Cardiovascular: Negative for chest pain.  Gastrointestinal: Negative for abdominal pain, blood in stool and vomiting.  Genitourinary: Negative for difficulty urinating and frequency.  Musculoskeletal: Negative for neck pain.  Skin: Negative for rash.  Allergic/Immunologic: Negative for environmental allergies and food allergies.  Neurological: Negative for weakness and headaches.  Psychiatric/Behavioral: Negative for agitation.  All other systems reviewed and are negative.      Objective:   Physical Exam  Constitutional: He appears well-developed and well-nourished.  HENT:  Head: Normocephalic and atraumatic.  Right Ear: External ear normal.  Left Ear: External ear normal.  Nose: Nose normal.  Mouth/Throat: Oropharynx is clear and moist.  Eyes: Pupils are equal, round, and reactive to light. EOM are normal.  Neck: Normal range of motion. Neck supple. No thyromegaly present.  Cardiovascular: Normal rate, regular rhythm  and normal heart sounds.  No murmur heard. Pulmonary/Chest: Effort normal and breath sounds normal. No respiratory distress. He has no wheezes.  Abdominal: Soft. Bowel sounds are normal. He exhibits no distension and no mass. There is no tenderness.  Genitourinary: Penis normal.  Genitourinary Comments: Prostate within normal limits  Musculoskeletal: Normal range of motion. He exhibits no edema.  Lymphadenopathy:    He has no cervical adenopathy.  Neurological: He is alert. He exhibits normal muscle tone.  Fine essential tremor still present  Skin: Skin is warm and dry. No erythema.  Psychiatric: He has a normal mood and affect. His behavior is normal. Judgment normal.  Vitals reviewed.         Assessment & Plan:  Impression wellness exam discussed exercise discussed.  Vaccines discussed.  Flu shot today.  Tdap today.  Shingrix prescription given.  Colon next due ten yrs after age 41  2.  Generalized anxiety disorder.  Compliant with medication.  Still helping patient wishes to refill.  Screening for this shows excellent control  3.  Essential tremor.  Mild nature.  Ongoing.  CBGs discussed with patient  Follow-up in 6 months

## 2018-08-04 ENCOUNTER — Telehealth: Payer: Self-pay | Admitting: Family Medicine

## 2018-08-04 NOTE — Telephone Encounter (Signed)
Pt informed and verbalized understanding

## 2018-08-04 NOTE — Telephone Encounter (Signed)
Pt contacted office stating that he received the Shingles shot on Tuesday at [facility]. Pt states he was doing fine on Wednesday with just a little soreness. States he woke up on Thursday and had a 2 inch patch below injection area. Pt states no fever, diarrhea, no headache or other symptoms. Pt is wanting to know if this is normal. Please advise. Thank you.

## 2018-08-04 NOTE — Telephone Encounter (Signed)
Yes normal should fade over next week

## 2018-09-18 DIAGNOSIS — X32XXXD Exposure to sunlight, subsequent encounter: Secondary | ICD-10-CM | POA: Diagnosis not present

## 2018-09-18 DIAGNOSIS — Z85828 Personal history of other malignant neoplasm of skin: Secondary | ICD-10-CM | POA: Diagnosis not present

## 2018-09-18 DIAGNOSIS — Z08 Encounter for follow-up examination after completed treatment for malignant neoplasm: Secondary | ICD-10-CM | POA: Diagnosis not present

## 2018-09-18 DIAGNOSIS — L570 Actinic keratosis: Secondary | ICD-10-CM | POA: Diagnosis not present

## 2018-11-08 ENCOUNTER — Telehealth: Payer: Self-pay

## 2018-11-08 NOTE — Telephone Encounter (Signed)
Put him back in April 8 as a telephone visit and well disc then

## 2018-11-08 NOTE — Telephone Encounter (Signed)
Patient is aware of all and was transferred up front to set up the appointment date and time.

## 2018-11-08 NOTE — Telephone Encounter (Signed)
Patient was called today to reschedule his appt from 11/21/2017. He states he was going to discuss these issues with you at that time,but since it has been cancelled he wanted your advise.  He states he has had bilateral elbow pain (he states he has a history of arthritis) for the last two months. When he has these pains he experiences shooting pain across his nipples, he noticed 2-3 weeks ago.  He says the elbow pain and the shooting pain come and go and is not a constant thing. He is not currently having these issues and has no other symptoms, no shortness of breath,no chest pressure. Please advise.Should he do a visit here in the office? Phone visit ? Would like to know what you thought of this. Please advise.

## 2018-11-22 ENCOUNTER — Ambulatory Visit (INDEPENDENT_AMBULATORY_CARE_PROVIDER_SITE_OTHER): Payer: BLUE CROSS/BLUE SHIELD | Admitting: Family Medicine

## 2018-11-22 ENCOUNTER — Other Ambulatory Visit: Payer: Self-pay

## 2018-11-22 ENCOUNTER — Encounter: Payer: Self-pay | Admitting: Family Medicine

## 2018-11-22 ENCOUNTER — Ambulatory Visit: Payer: BLUE CROSS/BLUE SHIELD | Admitting: Family Medicine

## 2018-11-22 DIAGNOSIS — M25521 Pain in right elbow: Secondary | ICD-10-CM

## 2018-11-22 DIAGNOSIS — M25522 Pain in left elbow: Secondary | ICD-10-CM | POA: Diagnosis not present

## 2018-11-22 DIAGNOSIS — F411 Generalized anxiety disorder: Secondary | ICD-10-CM

## 2018-11-22 DIAGNOSIS — G4730 Sleep apnea, unspecified: Secondary | ICD-10-CM

## 2018-11-22 MED ORDER — CITALOPRAM HYDROBROMIDE 20 MG PO TABS: 20.0000 mg | ORAL_TABLET | Freq: Every day | ORAL | 1 refills | Status: DC

## 2018-11-22 NOTE — Progress Notes (Signed)
   Subjective:    Patient ID: Christian Davis, male    DOB: [birth year], 63 y.o.   MRN: [id number] Audio plus visual virtual visit Anxiety  Presents for follow-up visit.    taking citalopram 20mg  one daily. Had stopped taking med for about 6 months but when this pandemic started he had to start back on med. Pt states he feels fine on med. No problems with it.   Bilateral elbow pain off and on for a couple of months. Takes tylenol.   Virtual Visit via Telephone Note  I connected with Christian Davis on 11/22/18 at  9:30 AM EDT by telephone and verified that I am speaking with the correct person using two identifiers.   I discussed the limitations, risks, security and privacy concerns of performing an evaluation and management service by telephone and the availability of in person appointments. I also discussed with the patient that there may be a patient responsible charge related to this service. The patient expressed understanding and agreed to proceed.   Elbow pain discussed.  Bilateral.  Tooth achy in nature.  Patient has adjusted his diet thinking it is something in his diet.  He is worried it is in the joints.  Pain worse on the lateral part of the elbow.  Has been doing a lot more physical activity lately  History of Present Illness:    Observations/Objective:   Assessment and Plan:   Follow Up Instructions:    I discussed the assessment and treatment plan with the patient. The patient was provided an opportunity to ask questions and all were answered. The patient agreed with the plan and demonstrated an understanding of the instructions.   The patient was advised to call back or seek an in-person evaluation if the symptoms worsen or if the condition fails to improve as anticipated I provided 25 minutes of non-face-to-face time during this encounter.     Review of Systems No headache, no major weight loss or weight gain, no chest pain no back pain abdominal pain no change  in bowel habits complete ROS otherwise negative     Objective:   Physical Exam    Virtual visit    Assessment & Plan:  Impression 1 bilateral lateral epicondylitis.  Discussed.  Consider forearm strap.  Local measures discussed.  Oral medications discussed.  No need for x-rays.  Doubt inside joint per se.  Rationale discussed  2.  Generalized anxiety disorder history of night terrors states medication definitely helping would like to maintain.  3.  Sleep study reviewed results negative no need for intervention try to maintain weight  Follow-up in 6 months for wellness plus chronic  No concerns discussed diet exercise discussed medications refilled

## 2019-02-20 ENCOUNTER — Other Ambulatory Visit: Payer: Self-pay | Admitting: Family Medicine

## 2019-05-18 ENCOUNTER — Other Ambulatory Visit: Payer: Self-pay | Admitting: Family Medicine

## 2019-09-19 ENCOUNTER — Ambulatory Visit (INDEPENDENT_AMBULATORY_CARE_PROVIDER_SITE_OTHER): Payer: Self-pay | Admitting: Family Medicine

## 2019-09-19 ENCOUNTER — Other Ambulatory Visit: Payer: Self-pay

## 2019-09-19 DIAGNOSIS — J329 Chronic sinusitis, unspecified: Secondary | ICD-10-CM

## 2019-09-19 DIAGNOSIS — J310 Chronic rhinitis: Secondary | ICD-10-CM

## 2019-09-19 MED ORDER — AMOXICILLIN 500 MG PO CAPS: 500.0000 mg | ORAL_CAPSULE | Freq: Three times a day (TID) | ORAL | 0 refills | Status: DC

## 2019-09-19 NOTE — Progress Notes (Signed)
   Subjective:    Patient ID: Christian Davis, male    DOB: [birth year], 64 y.o.   MRN: [id number]  Cough This is a new problem. The current episode started 1 to 4 weeks ago. Associated symptoms comments: drainage.   Drainage causes him to cough or gag Has history of allergies    Review of Systems  Respiratory: Positive for cough.    Virtual Visit via Video Note  I connected with Christian Davis on 09/19/19 at  8:30 AM EST by a video enabled telemedicine application and verified that I am speaking with the correct person using two identifiers.  Location: Patient: home Provider: office   I discussed the limitations of evaluation and management by telemedicine and the availability of in person appointments. The patient expressed understanding and agreed to proceed.  History of Present Illness:    Observations/Objective:   Assessment and Plan:   Follow Up Instructions:    I discussed the assessment and treatment plan with the patient. The patient was provided an opportunity to ask questions and all were answered. The patient agreed with the plan and demonstrated an understanding of the instructions.   The patient was advised to call back or seek an in-person evaluation if the symptoms worsen or if the condition fails to improve as anticipated.  I provided 20 minutes of non-face-to-face time during this encounter.  Drainage and coughing   Gagging  Takes a claritin daily  No h a  Has not moved into the chest   No sickness around him  doest      Objective:   Physical Exam  Virtual      Assessment & Plan:  Impression subacute rhinitis with drainage and throat irritation.  Weeks duration.  Patient feels started as a allergy.  Antibiotics prescribed for sinus component.  Coronavirus testing encouraged because of exposure to older relatives more than anything.  2 weeks out were outside the window of normal capture of viral presents.

## 2019-09-20 ENCOUNTER — Encounter: Payer: Self-pay | Admitting: Family Medicine

## 2019-09-20 ENCOUNTER — Ambulatory Visit: Payer: Self-pay | Attending: Internal Medicine

## 2019-09-20 DIAGNOSIS — Z20822 Contact with and (suspected) exposure to covid-19: Secondary | ICD-10-CM | POA: Insufficient documentation

## 2019-09-21 LAB — NOVEL CORONAVIRUS, NAA: SARS-CoV-2, NAA: NOT DETECTED

## 2019-09-22 ENCOUNTER — Telehealth: Payer: Self-pay

## 2019-09-22 NOTE — Telephone Encounter (Signed)

## 2020-01-08 ENCOUNTER — Telehealth: Payer: Self-pay | Admitting: Family Medicine

## 2020-01-08 DIAGNOSIS — Z131 Encounter for screening for diabetes mellitus: Secondary | ICD-10-CM

## 2020-01-08 DIAGNOSIS — Z79899 Other long term (current) drug therapy: Secondary | ICD-10-CM

## 2020-01-08 DIAGNOSIS — Z125 Encounter for screening for malignant neoplasm of prostate: Secondary | ICD-10-CM

## 2020-01-08 DIAGNOSIS — Z13220 Encounter for screening for lipoid disorders: Secondary | ICD-10-CM

## 2020-01-08 NOTE — Telephone Encounter (Signed)
Blood work ordered in Epic. Patient notified. 

## 2020-01-08 NOTE — Telephone Encounter (Signed)
Yes pls order. Thx. Dr T ° °

## 2020-01-08 NOTE — Telephone Encounter (Signed)
Patient has [place] with Dr Lovena Le on  06/25 does patient need lab work before appt

## 2020-01-29 LAB — CBC WITH DIFFERENTIAL/PLATELET
Basophils Absolute: 0.1 10*3/uL (ref 0.0–0.2)
Basos: 1 %
EOS (ABSOLUTE): 0.1 10*3/uL (ref 0.0–0.4)
Eos: 1 %
Hematocrit: 49.2 % (ref 37.5–51.0)
Hemoglobin: 16.3 g/dL (ref 13.0–17.7)
Immature Grans (Abs): 0 10*3/uL (ref 0.0–0.1)
Immature Granulocytes: 0 %
Lymphocytes Absolute: 3 10*3/uL (ref 0.7–3.1)
Lymphs: 39 %
MCH: 30.5 pg (ref 26.6–33.0)
MCHC: 33.1 g/dL (ref 31.5–35.7)
MCV: 92 fL (ref 79–97)
Monocytes Absolute: 0.6 10*3/uL (ref 0.1–0.9)
Monocytes: 8 %
Neutrophils Absolute: 4 10*3/uL (ref 1.4–7.0)
Neutrophils: 51 %
Platelets: 212 10*3/uL (ref 150–450)
RBC: 5.35 x10E6/uL (ref 4.14–5.80)
RDW: 12.9 % (ref 11.6–15.4)
WBC: 7.8 10*3/uL (ref 3.4–10.8)

## 2020-01-29 LAB — BASIC METABOLIC PANEL
BUN/Creatinine Ratio: 14 (ref 10–24)
BUN: 11 mg/dL (ref 8–27)
CO2: 23 mmol/L (ref 20–29)
Calcium: 9 mg/dL (ref 8.6–10.2)
Chloride: 97 mmol/L (ref 96–106)
Creatinine, Ser: 0.8 mg/dL (ref 0.76–1.27)
GFR calc Af Amer: 111 mL/min/{1.73_m2} (ref >59)
GFR calc non Af Amer: 96 mL/min/{1.73_m2} (ref >59)
Glucose: 87 mg/dL (ref 65–99)
Potassium: 5 mmol/L (ref 3.5–5.2)
Sodium: 134 mmol/L (ref 134–144)

## 2020-01-29 LAB — HEPATIC FUNCTION PANEL
ALT: 17 IU/L (ref 0–44)
AST: 20 IU/L (ref 0–40)
Albumin: 4.3 g/dL (ref 3.8–4.8)
Alkaline Phosphatase: 73 IU/L (ref 48–121)
Bilirubin Total: 0.6 mg/dL (ref 0.0–1.2)
Bilirubin, Direct: 0.14 mg/dL (ref 0.00–0.40)
Total Protein: 6.9 g/dL (ref 6.0–8.5)

## 2020-01-29 LAB — LIPID PANEL
Chol/HDL Ratio: 3.9 ratio (ref 0.0–5.0)
Cholesterol, Total: 206 mg/dL — ABNORMAL HIGH (ref 100–199)
HDL: 53 mg/dL (ref >39)
LDL Chol Calc (NIH): 135 mg/dL — ABNORMAL HIGH (ref 0–99)
Triglycerides: 102 mg/dL (ref 0–149)
VLDL Cholesterol Cal: 18 mg/dL (ref 5–40)

## 2020-01-29 LAB — PSA: Prostate Specific Ag, Serum: 0.4 ng/mL (ref 0.0–4.0)

## 2020-01-31 ENCOUNTER — Other Ambulatory Visit: Payer: Self-pay | Admitting: Family Medicine

## 2020-01-31 NOTE — Telephone Encounter (Signed)
Has appt 6/25 and labs already completed this month. Last visit for anxiety was 11/22/18

## 2020-02-08 ENCOUNTER — Ambulatory Visit (INDEPENDENT_AMBULATORY_CARE_PROVIDER_SITE_OTHER): Payer: PRIVATE HEALTH INSURANCE | Admitting: Family Medicine

## 2020-02-08 ENCOUNTER — Other Ambulatory Visit: Payer: Self-pay

## 2020-02-08 ENCOUNTER — Encounter: Payer: Self-pay | Admitting: Family Medicine

## 2020-02-08 VITALS — BP 112/78 | HR 89 | Temp 97.5°F | Ht 71.0 in | Wt 251.0 lb

## 2020-02-08 DIAGNOSIS — E785 Hyperlipidemia, unspecified: Secondary | ICD-10-CM | POA: Diagnosis not present

## 2020-02-08 DIAGNOSIS — J61 Pneumoconiosis due to asbestos and other mineral fibers: Secondary | ICD-10-CM | POA: Diagnosis not present

## 2020-02-08 DIAGNOSIS — F514 Sleep terrors [night terrors]: Secondary | ICD-10-CM | POA: Diagnosis not present

## 2020-02-08 DIAGNOSIS — Z0000 Encounter for general adult medical examination without abnormal findings: Secondary | ICD-10-CM | POA: Diagnosis not present

## 2020-02-08 MED ORDER — CITALOPRAM HYDROBROMIDE 20 MG PO TABS: 20.0000 mg | ORAL_TABLET | Freq: Every day | ORAL | 2 refills | Status: DC

## 2020-02-08 NOTE — Progress Notes (Signed)
Patient ID: Christian Davis, male    DOB: 31-Jan-1956, 64 y.o.   MRN: [id number]   Chief Complaint  Patient presents with  . Annual Exam   Subjective:    HPI   Feeling well since last visit. Has occ elbows achiness in joints.  Not taking anything for the pain.  Sinuses/allergies- taking zyrtec daily. occ changes between that and allegra.  Pt was having night terrors, and taking celexa 20mg .  Since 2017.  Has been doing well on this medication.  Has helped decrease the night terrors.   Seeing Pulm, Dr. Pearlie Davis, for yearly CT chest scan for evaluation of asbestosis.  No current issues with coughing, hemoptysis, or fever.  The patient comes in today for a wellness visit.  A review of their health history was completed.  A review of medications was also completed.  Any needed refills; yes  Eating habits: pretty good  Falls/  MVA accidents in past few months: none  Regular exercise: not since Covid hit  Specialist pt sees on regular basis: pulmonologist  Preventative health issues were discussed.   Additional concerns: discuss recent labs   Medical History Christian Davis has no past medical history on file.   Outpatient Encounter Medications as of 02/08/2020  Medication Sig  . citalopram (CELEXA) 20 MG tablet Take 1 tablet (20 mg total) by mouth daily.  . [DISCONTINUED] citalopram (CELEXA) 20 MG tablet TAKE 1 TABLET BY MOUTH EVERY DAY  . [DISCONTINUED] PROAIR HFA 108 (90 Base) MCG/ACT inhaler TAKE 2 PUFFS BY MOUTH EVERY 6 HOURS AS NEEDED FOR WHEEZE OR SHORTNESS OF BREATH   No facility-administered encounter medications on file as of 02/08/2020.     Review of Systems  Constitutional: Negative for chills and fever.  HENT: Negative for congestion, rhinorrhea and sore throat.   Respiratory: Negative for cough, shortness of breath and wheezing.   Cardiovascular: Negative for chest pain and leg swelling.  Gastrointestinal: Negative for abdominal pain, diarrhea, nausea and  vomiting.  Genitourinary: Negative for dysuria and frequency.  Skin: Negative for rash.  Neurological: Negative for dizziness, weakness and headaches.     Vitals BP 112/78   Pulse 89   Temp (!) 97.5 F (36.4 C) (Oral)   Ht 5\' 11"  (1.803 m)   Wt 251 lb (113.9 kg)   SpO2 99%   BMI 35.01 kg/m   Objective:   Physical Exam Vitals and nursing note reviewed.  Constitutional:      General: He is not in acute distress.    Appearance: Normal appearance. He is not ill-appearing.  HENT:     Head: Normocephalic.     Right Ear: Tympanic membrane, ear canal and external ear normal.     Left Ear: Tympanic membrane, ear canal and external ear normal.     Nose: Nose normal. No congestion or rhinorrhea.     Mouth/Throat:     Mouth: Mucous membranes are moist.     Pharynx: No oropharyngeal exudate or posterior oropharyngeal erythema.  Eyes:     Extraocular Movements: Extraocular movements intact.     Conjunctiva/sclera: Conjunctivae normal.     Pupils: Pupils are equal, round, and reactive to light.  Cardiovascular:     Rate and Rhythm: Normal rate and regular rhythm.     Pulses: Normal pulses.     Heart sounds: Normal heart sounds. No murmur heard.   Pulmonary:     Effort: Pulmonary effort is normal.     Breath sounds: Normal breath sounds. No wheezing,  rhonchi or rales.  Abdominal:     General: Abdomen is flat. Bowel sounds are normal. There is no distension.     Palpations: Abdomen is soft. There is no mass.     Tenderness: There is no abdominal tenderness. There is no guarding or rebound.     Hernia: No hernia is present.  Musculoskeletal:        General: Normal range of motion.     Cervical back: Normal range of motion.     Right lower leg: No edema.     Left lower leg: No edema.  Skin:    General: Skin is warm and dry.     Findings: No rash.  Neurological:     General: No focal deficit present.     Mental Status: He is alert and oriented to person, place, and time.      Cranial Nerves: No cranial nerve deficit.  Psychiatric:        Mood and Affect: Mood normal.        Behavior: Behavior normal.        Thought Content: Thought content normal.        Judgment: Judgment normal.      Assessment and Plan   1. Encounter for well adult exam without abnormal findings  2. Asbestosis ([place])  3. Night terrors, adult - citalopram (CELEXA) 20 MG tablet; Take 1 tablet (20 mg total) by mouth daily.  Dispense: 90 tablet; Refill: 2  4. Hyperlipidemia, unspecified hyperlipidemia type   CT image results -Through care everywhere, has ct scan chest yearly for asbestosis.  Seeing Dr. Pearlie Davis with [facility]. CT chest - 3/21- no CT evidence of pleural or pulm malignancy, no changes.  Labs reviewed, slight increase in LDL and total cholesterol compared to last labs in 2018.  HLD- cont to monitor and dec cholesterol in diet and increase in exercising.    F/u 1 yr or prn.

## 2020-08-11 ENCOUNTER — Telehealth (INDEPENDENT_AMBULATORY_CARE_PROVIDER_SITE_OTHER): Payer: PRIVATE HEALTH INSURANCE | Admitting: Family Medicine

## 2020-08-11 DIAGNOSIS — J069 Acute upper respiratory infection, unspecified: Secondary | ICD-10-CM | POA: Diagnosis not present

## 2020-08-11 NOTE — Progress Notes (Signed)
   Patient ID: Christian Davis, male    DOB: 10/17/1955, 64 y.o.   MRN: [id number]   Virtual Visit via phone Note  I connected with Bufford Buttner on 08/11/20 at 11:00 AM EST by a phone enabled telemedicine application and verified that I am speaking with the correct person using two identifiers.  Location: Patient: home Provider: office   I discussed the limitations of evaluation and management by telemedicine and the availability of in person appointments. The patient expressed understanding and agreed to proceed.  Chief Complaint  Patient presents with  . Sinusitis   Subjective:    Sinusitis This is a new problem. Episode onset: patient always feels allergy symptoms but has gotten worse in the last 4 days.  There has been no fever. Associated symptoms include coughing and a sore throat. Pertinent negatives include no chills, congestion, ear pain, shortness of breath or sinus pressure. (Nasal drainage  ) Treatments tried: allegra.   Pt having some h/o allergies.  Drainage from nose and coughing up heavy clear.  Not been around anyone sick recently. Had covid test last week- negative. Booster moderna vaccine.  No fever.  Just lots of nasal drainage.  No sinus pain or pressure. Coughing, clear sputum. Sore throat. No eye or ear symptoms. meds- claritin, gel caps.   Medical History Vignesh has no past medical history on file.   Outpatient Encounter Medications as of 08/11/2020  Medication Sig  . citalopram (CELEXA) 20 MG tablet Take 1 tablet (20 mg total) by mouth daily.  Marland Kitchen OVER THE COUNTER MEDICATION Allegra   No facility-administered encounter medications on file as of 08/11/2020.     Review of Systems  Constitutional: Negative for chills and fever.  HENT: Positive for rhinorrhea and sore throat. Negative for congestion, ear discharge, ear pain, sinus pressure and sinus pain.   Eyes: Negative for pain, discharge and itching.  Respiratory: Positive for cough. Negative  for shortness of breath.      Vitals There were no vitals taken for this visit.  Objective:   Physical Exam  No PE due to phone visit.  Assessment and Plan   1. Viral URI with cough    Advised likely has viral syndrome or "cold virus." recommending symptomatic tx.  If having pain in face, pressure, and green drainage, fever, may start the amoxicillin. Pt had some amoxicillin at home pt started taking. Has about 7 days worth.  Has flonase at home. advising to get sudafed and to take those and to call in 2-3 days if not improving. Tylenol or ibuprofen for pain, increase in fluids.  Pt in agreement.    F/u prn.  Follow Up Instructions:    I discussed the assessment and treatment plan with the patient. The patient was provided an opportunity to ask questions and all were answered. The patient agreed with the plan and demonstrated an understanding of the instructions.   The patient was advised to call back or seek an in-person evaluation if the symptoms worsen or if the condition fails to improve as anticipated.  I provided 11 minutes of non-face-to-face time during this encounter.

## 2021-01-27 ENCOUNTER — Other Ambulatory Visit: Payer: Self-pay | Admitting: Family Medicine

## 2021-01-27 DIAGNOSIS — F514 Sleep terrors [night terrors]: Secondary | ICD-10-CM

## 2021-03-02 NOTE — Telephone Encounter (Signed)
Moved records to new doctor. No lonmger our patient

## 2022-02-15 DIAGNOSIS — Z299 Encounter for prophylactic measures, unspecified: Secondary | ICD-10-CM | POA: Diagnosis not present

## 2022-02-15 DIAGNOSIS — M1990 Unspecified osteoarthritis, unspecified site: Secondary | ICD-10-CM | POA: Diagnosis not present

## 2022-02-15 DIAGNOSIS — Z9109 Other allergy status, other than to drugs and biological substances: Secondary | ICD-10-CM | POA: Diagnosis not present

## 2022-02-15 DIAGNOSIS — Z789 Other specified health status: Secondary | ICD-10-CM | POA: Diagnosis not present

## 2022-03-01 DIAGNOSIS — H353131 Nonexudative age-related macular degeneration, bilateral, early dry stage: Secondary | ICD-10-CM | POA: Diagnosis not present

## 2022-03-01 DIAGNOSIS — H524 Presbyopia: Secondary | ICD-10-CM | POA: Diagnosis not present

## 2022-03-09 DIAGNOSIS — M1990 Unspecified osteoarthritis, unspecified site: Secondary | ICD-10-CM | POA: Diagnosis not present

## 2022-03-09 DIAGNOSIS — J309 Allergic rhinitis, unspecified: Secondary | ICD-10-CM | POA: Diagnosis not present

## 2022-04-21 DIAGNOSIS — J3089 Other allergic rhinitis: Secondary | ICD-10-CM | POA: Diagnosis not present

## 2022-04-26 DIAGNOSIS — J3089 Other allergic rhinitis: Secondary | ICD-10-CM | POA: Diagnosis not present

## 2022-04-26 DIAGNOSIS — K219 Gastro-esophageal reflux disease without esophagitis: Secondary | ICD-10-CM | POA: Diagnosis not present

## 2022-05-31 DIAGNOSIS — Z0000 Encounter for general adult medical examination without abnormal findings: Secondary | ICD-10-CM | POA: Diagnosis not present

## 2022-05-31 DIAGNOSIS — Z7189 Other specified counseling: Secondary | ICD-10-CM | POA: Diagnosis not present

## 2022-05-31 DIAGNOSIS — E7800 Pure hypercholesterolemia, unspecified: Secondary | ICD-10-CM | POA: Diagnosis not present

## 2022-05-31 DIAGNOSIS — Z79899 Other long term (current) drug therapy: Secondary | ICD-10-CM | POA: Diagnosis not present

## 2022-05-31 DIAGNOSIS — Z299 Encounter for prophylactic measures, unspecified: Secondary | ICD-10-CM | POA: Diagnosis not present

## 2022-05-31 DIAGNOSIS — R5383 Other fatigue: Secondary | ICD-10-CM | POA: Diagnosis not present

## 2022-06-11 ENCOUNTER — Encounter: Payer: Self-pay | Admitting: *Deleted

## 2022-06-25 ENCOUNTER — Encounter: Payer: Self-pay | Admitting: *Deleted

## 2022-06-25 NOTE — Patient Instructions (Signed)
  Procedure: Colonscopy  Estimated body mass index is 33.91 kg/m as calculated from the following:   Height as of this encounter: 6' (1.829 m).   Weight as of this encounter: 250 lb (113.4 kg).   Have you had a colonoscopy before?  10-12 years ago  Do you have family history of colon cancer  no  Do you have a family history of polyps? no  Previous colonoscopy with polyps removed? no  Do you have a history colorectal cancer?   no  Are you diabetic?  no  Do you have a prosthetic or mechanical heart valve? no  Do you have a pacemaker/defibrillator?   no  Have you had endocarditis/atrial fibrillation?  no  Do you use supplemental oxygen/CPAP?  no  Have you had joint replacement within the last 12 months?  no  Do you tend to be constipated or have to use laxatives?  no   Do you have history of alcohol use? If yes, how much and how often? Yes 4 most days  Do you have history or are you using drugs? If yes, what do are you  using?  no  Have you ever had a stroke/heart attack?  no  Have you ever had a heart or other vascular stent placed,?no  Do you take weight loss medication? no  Do you take any blood-thinning medications such as: (Plavix, aspirin, Coumadin, Aggrenox, Brilinta, Xarelto, Eliquis, Pradaxa, Savaysa or Effient) no  If yes we need the name, milligram, dosage and who is prescribing doctor:               Current Outpatient Medications  Medication Sig Dispense Refill   acetaminophen (TYLENOL) 650 MG CR tablet Take 1,300 mg by mouth daily.     Apoaequorin (PREVAGEN PO) Take by mouth daily.     azelastine (ASTELIN) 0.1 % nasal spray Place into both nostrils as needed for rhinitis. Use in each nostril as directed     Cholecalciferol (D3 PO) Take by mouth daily.     cyanocobalamin (VITAMIN B12) 1000 MCG tablet Take 1,000 mcg by mouth daily.     levocetirizine (XYZAL) 5 MG tablet Take 5 mg by mouth every evening.     meloxicam (MOBIC) 7.5 MG tablet Take 7.5 mg by  mouth daily.     omeprazole (PRILOSEC) 40 MG capsule Take 40 mg by mouth daily.     No current facility-administered medications for this visit.    Allergies  Allergen Reactions   Codeine

## 2022-06-25 NOTE — Progress Notes (Signed)
ASA 3 due to alcohol use. Okay to schedule.

## 2022-07-01 NOTE — Progress Notes (Signed)
LMTRC

## 2022-07-07 ENCOUNTER — Encounter: Payer: Self-pay | Admitting: *Deleted

## 2022-07-23 ENCOUNTER — Encounter: Payer: Self-pay | Admitting: *Deleted

## 2022-07-23 MED ORDER — PEG 3350-KCL-NA BICARB-NACL 420 G PO SOLR: 4000.0000 mL | Freq: Once | ORAL | 0 refills | Status: AC

## 2022-07-23 NOTE — Progress Notes (Signed)
Pt called in. Scheduled for 1/8 at 9:45am with Dr. Abbey Chatters. Aware will mail prep instructions/pre-op appt. Rx for prep sent to pharmacy.

## 2022-07-27 DIAGNOSIS — K219 Gastro-esophageal reflux disease without esophagitis: Secondary | ICD-10-CM | POA: Diagnosis not present

## 2022-07-27 DIAGNOSIS — J3089 Other allergic rhinitis: Secondary | ICD-10-CM | POA: Diagnosis not present

## 2022-08-18 ENCOUNTER — Encounter (HOSPITAL_COMMUNITY)
Admission: RE | Admit: 2022-08-18 | Discharge: 2022-08-18 | Disposition: A | Discharge status: Home / Self Care | Payer: Medicare Other | Source: Ambulatory Visit | Attending: Internal Medicine | Admitting: Internal Medicine

## 2022-08-18 ENCOUNTER — Encounter (HOSPITAL_COMMUNITY): Payer: Self-pay

## 2022-08-18 HISTORY — DX: Unspecified osteoarthritis, unspecified site: M19.90

## 2022-08-18 HISTORY — DX: Personal history of urinary calculi: Z87.442

## 2022-08-18 HISTORY — DX: Depression, unspecified: F32.A

## 2022-08-18 HISTORY — DX: Malignant (primary) neoplasm, unspecified: C80.1

## 2022-08-18 HISTORY — DX: Gastro-esophageal reflux disease without esophagitis: K21.9

## 2022-08-18 HISTORY — DX: Dyspnea, unspecified: R06.00

## 2022-08-19 ENCOUNTER — Telehealth: Payer: Self-pay | Admitting: *Deleted

## 2022-08-19 NOTE — Telephone Encounter (Signed)
[employer] PA: APPROVED  Authorization #: [id number]  DOS: 08/23/22-11/21/22

## 2022-08-23 ENCOUNTER — Encounter (HOSPITAL_COMMUNITY)
Admission: RE | Disposition: A | Discharge status: Home / Self Care | Payer: Self-pay | Source: Home / Self Care | Attending: Internal Medicine

## 2022-08-23 ENCOUNTER — Encounter (HOSPITAL_COMMUNITY): Payer: Self-pay

## 2022-08-23 ENCOUNTER — Ambulatory Visit (HOSPITAL_COMMUNITY)
Admission: RE | Admit: 2022-08-23 | Discharge: 2022-08-23 | Disposition: A | Discharge status: Home / Self Care | Payer: Medicare Other | Attending: Internal Medicine | Admitting: Internal Medicine

## 2022-08-23 ENCOUNTER — Ambulatory Visit (HOSPITAL_BASED_OUTPATIENT_CLINIC_OR_DEPARTMENT_OTHER): Payer: Medicare Other | Admitting: Certified Registered Nurse Anesthetist

## 2022-08-23 ENCOUNTER — Ambulatory Visit (HOSPITAL_COMMUNITY): Payer: Medicare Other | Admitting: Certified Registered Nurse Anesthetist

## 2022-08-23 DIAGNOSIS — K219 Gastro-esophageal reflux disease without esophagitis: Secondary | ICD-10-CM | POA: Insufficient documentation

## 2022-08-23 DIAGNOSIS — Z139 Encounter for screening, unspecified: Secondary | ICD-10-CM | POA: Diagnosis not present

## 2022-08-23 DIAGNOSIS — Z1211 Encounter for screening for malignant neoplasm of colon: Secondary | ICD-10-CM | POA: Insufficient documentation

## 2022-08-23 DIAGNOSIS — D124 Benign neoplasm of descending colon: Secondary | ICD-10-CM | POA: Insufficient documentation

## 2022-08-23 DIAGNOSIS — F419 Anxiety disorder, unspecified: Secondary | ICD-10-CM | POA: Insufficient documentation

## 2022-08-23 DIAGNOSIS — F32A Depression, unspecified: Secondary | ICD-10-CM | POA: Diagnosis not present

## 2022-08-23 DIAGNOSIS — K635 Polyp of colon: Secondary | ICD-10-CM | POA: Diagnosis not present

## 2022-08-23 HISTORY — PX: COLONOSCOPY WITH PROPOFOL: SHX5780

## 2022-08-23 HISTORY — PX: POLYPECTOMY: SHX5525

## 2022-08-23 SURGERY — COLONOSCOPY WITH PROPOFOL
Anesthesia: General

## 2022-08-23 MED ORDER — PROPOFOL 10 MG/ML IV BOLUS
INTRAVENOUS | Status: DC | PRN
  Administered 2022-08-23: 20 mg via INTRAVENOUS
  Administered 2022-08-23: 40 mg via INTRAVENOUS
  Administered 2022-08-23: 50 mg via INTRAVENOUS
  Administered 2022-08-23: 100 mg via INTRAVENOUS
  Administered 2022-08-23: 50 mg via INTRAVENOUS

## 2022-08-23 MED ORDER — LIDOCAINE HCL (CARDIAC) PF 100 MG/5ML IV SOSY
PREFILLED_SYRINGE | INTRAVENOUS | Status: DC | PRN
  Administered 2022-08-23: 60 mg via INTRAVENOUS

## 2022-08-23 MED ORDER — LACTATED RINGERS IV SOLN: INTRAVENOUS | Status: DC | PRN

## 2022-08-23 NOTE — Transfer of Care (Signed)
Immediate Anesthesia Transfer of Care Note  Patient: Christian Davis  Procedure(s) Performed: COLONOSCOPY WITH PROPOFOL POLYPECTOMY  Patient Location: Short Stay  Anesthesia Type:General  Level of Consciousness: drowsy  Airway & Oxygen Therapy: Patient Spontanous Breathing  Post-op Assessment: Report given to RN and Post -op Vital signs reviewed and stable  Post vital signs: Reviewed and stable  Last Vitals:  Vitals Value Taken Time  BP 110/66 08/23/22 0851  Temp 36.6 C 08/23/22 0851  Pulse 77 08/23/22 0851  Resp 16   SpO2 96 % 08/23/22 0851    Last Pain:  Vitals:   08/23/22 0851  TempSrc: Oral  PainSc: 0-No pain         Complications: No notable events documented.

## 2022-08-23 NOTE — Discharge Instructions (Signed)
  Colonoscopy Discharge Instructions  Read the instructions outlined below and refer to this sheet in the next few weeks. These discharge instructions provide you with general information on caring for yourself after you leave the hospital. Your doctor may also give you specific instructions. While your treatment has been planned according to the most current medical practices available, unavoidable complications occasionally occur.   ACTIVITY You may resume your regular activity, but move at a slower pace for the next 24 hours.  Take frequent rest periods for the next 24 hours.  Walking will help get rid of the air and reduce the bloated feeling in your belly (abdomen).  No driving for 24 hours (because of the medicine (anesthesia) used during the test).   Do not sign any important legal documents or operate any machinery for 24 hours (because of the anesthesia used during the test).  NUTRITION Drink plenty of fluids.  You may resume your normal diet as instructed by your doctor.  Begin with a light meal and progress to your normal diet. Heavy or fried foods are harder to digest and may make you feel sick to your stomach (nauseated).  Avoid alcoholic beverages for 24 hours or as instructed.  MEDICATIONS You may resume your normal medications unless your doctor tells you otherwise.  WHAT YOU CAN EXPECT TODAY Some feelings of bloating in the abdomen.  Passage of more gas than usual.  Spotting of blood in your stool or on the toilet paper.  IF YOU HAD POLYPS REMOVED DURING THE COLONOSCOPY: No aspirin products for 7 days or as instructed.  No alcohol for 7 days or as instructed.  Eat a soft diet for the next 24 hours.  FINDING OUT THE RESULTS OF YOUR TEST Not all test results are available during your visit. If your test results are not back during the visit, make an appointment with your caregiver to find out the results. Do not assume everything is normal if you have not heard from your  caregiver or the medical facility. It is important for you to follow up on all of your test results.  SEEK IMMEDIATE MEDICAL ATTENTION IF: You have more than a spotting of blood in your stool.  Your belly is swollen (abdominal distention).  You are nauseated or vomiting.  You have a temperature over 101.  You have abdominal pain or discomfort that is severe or gets worse throughout the day.   Your colonoscopy revealed 3 polyp(s) which I removed successfully. Await pathology results, my office will contact you. I recommend repeating colonoscopy in 7 years for surveillance purposes. Otherwise follow up with Gi as needed.   I hope you have a great rest of your week!  Elon Alas. Abbey Chatters, D.O. Gastroenterology and Hepatology [facility]

## 2022-08-23 NOTE — Op Note (Signed)
[facility] Patient Name: Christian Davis Procedure Date: 08/23/2022 8:23 AM MRN: [id number] Date of Birth: [birth year] Attending MD: Elon Alas. Abbey Chatters , [place], [id number] CSN: [id number] Age: 67 Admit Type: Outpatient Procedure:                Colonoscopy Indications:              Screening for colorectal malignant neoplasm Providers:                Elon Alas. Abbey Chatters, DO, Caprice Kluver, Everardo [place] Referring MD:              Medicines:                See the Anesthesia note for documentation of the                            administered medications Complications:            No immediate complications. Estimated Blood Loss:     Estimated blood loss was minimal. Procedure:                Pre-Anesthesia Assessment:                           - The anesthesia plan was to use monitored                            anesthesia care (MAC).                           After obtaining informed consent, the colonoscope                            was passed under direct vision. Throughout the                            procedure, the patient's blood pressure, pulse, and                            oxygen saturations were monitored continuously. The                            [id number]-HQ190L ([id number]) scope was introduced through                            the anus and advanced to the the cecum, identified                            by appendiceal orifice and ileocecal valve. The                            colonoscopy was performed without difficulty. The                            patient tolerated the procedure well. The quality  of the bowel preparation was evaluated using the                            BBPS [facility] Bowel Preparation Scale) with scores                            of: Right Colon = 3, Transverse Colon = 3 and Left                            Colon = 3 (entire mucosa seen well with no residual                            staining, small fragments of stool or opaque                             liquid). The total BBPS score equals 9. Scope In: 8:34:07 AM Scope Out: 8:47:16 AM Scope Withdrawal Time: 0 hours 11 minutes 9 seconds  Total Procedure Duration: 0 hours 13 minutes 9 seconds  Findings:      The perianal and digital rectal examinations were normal.      Three sessile polyps were found in the descending colon. The polyps were       4 to 6 mm in size. These polyps were removed with a cold snare.       Resection and retrieval were complete.      The exam was otherwise without abnormality. Impression:               - Three 4 to 6 mm polyps in the descending colon,                            removed with a cold snare. Resected and retrieved.                           - The examination was otherwise normal. Moderate Sedation:      Per Anesthesia Care Recommendation:           - Patient has a contact number available for                            emergencies. The signs and symptoms of potential                            delayed complications were discussed with the                            patient. Return to normal activities tomorrow.                            Written discharge instructions were provided to the                            patient.                           - Resume previous  diet.                           - Continue present medications.                           - Await pathology results.                           - Repeat colonoscopy in 7-10 years for surveillance.                           - Return to GI clinic PRN. Procedure Code(s):        --- Professional ---                           [phone], Colonoscopy, flexible; with removal of                            tumor(s), polyp(s), or other lesion(s) by snare                            technique Diagnosis Code(s):        --- Professional ---                           Z12.11, Encounter for screening for malignant                            neoplasm of colon                            D12.4, Benign neoplasm of descending colon CPT copyright 2022 American Medical Association. All rights reserved. The codes documented in this report are preliminary and upon coder review may  be revised to meet current compliance requirements. Elon Alas. Abbey Chatters, DO Kirk Abbey Chatters, DO 08/23/2022 8:49:17 AM This report has been signed electronically. Number of Addenda: 0

## 2022-08-23 NOTE — Anesthesia Preprocedure Evaluation (Signed)
Anesthesia Evaluation  Patient identified by MRN, date of birth, ID band Patient awake    Reviewed: Allergy & Precautions, H&P , NPO status , Patient's Chart, lab work & pertinent test results, reviewed documented beta blocker date and time   Airway Mallampati: II  TM Distance: >3 FB Neck ROM: full    Dental no notable dental hx.    Pulmonary neg pulmonary ROS, shortness of breath   Pulmonary exam normal breath sounds clear to auscultation       Cardiovascular Exercise Tolerance: Good negative cardio ROS  Rhythm:regular Rate:Normal     Neuro/Psych  PSYCHIATRIC DISORDERS Anxiety Depression    negative neurological ROS  negative psych ROS   GI/Hepatic negative GI ROS, Neg liver ROS,GERD  ,,  Endo/Other  negative endocrine ROS    Renal/GU negative Renal ROS  negative genitourinary   Musculoskeletal   Abdominal   Peds  Hematology negative hematology ROS (+)   Anesthesia Other Findings   Reproductive/Obstetrics negative OB ROS                             Anesthesia Physical Anesthesia Plan  ASA: 2  Anesthesia Plan: General   Post-op Pain Management:    Induction:   PONV Risk Score and Plan: Propofol infusion  Airway Management Planned:   Additional Equipment:   Intra-op Plan:   Post-operative Plan:   Informed Consent: I have reviewed the patients History and Physical, chart, labs and discussed the procedure including the risks, benefits and alternatives for the proposed anesthesia with the patient or authorized representative who has indicated his/her understanding and acceptance.     Dental Advisory Given  Plan Discussed with: CRNA  Anesthesia Plan Comments:        Anesthesia Quick Evaluation

## 2022-08-23 NOTE — Anesthesia Postprocedure Evaluation (Signed)
Anesthesia Post Note  Patient: Christian Davis  Procedure(s) Performed: COLONOSCOPY WITH PROPOFOL POLYPECTOMY  Patient location during evaluation: Phase II Anesthesia Type: General Level of consciousness: awake Pain management: pain level controlled Vital Signs Assessment: post-procedure vital signs reviewed and stable Respiratory status: spontaneous breathing and respiratory function stable Cardiovascular status: blood pressure returned to baseline and stable Postop Assessment: no headache and no apparent nausea or vomiting Anesthetic complications: no Comments: Late entry   No notable events documented.   Last Vitals:  Vitals:   08/23/22 0800 08/23/22 0851  BP: (!) 149/73 110/66  Pulse: 73 77  Resp: 15   Temp: 36.7 C 36.6 C  SpO2: 99% 96%    Last Pain:  Vitals:   08/23/22 0851  TempSrc: Oral  PainSc: 0-No pain                 Louann Sjogren

## 2022-08-23 NOTE — H&P (Signed)
Primary Care Physician:  Monico Blitz, MD Primary Gastroenterologist:  Dr. Abbey Chatters  Pre-Procedure History & Physical: HPI:  Christian Davis is a 67 y.o. male is here for a colonoscopy for colon cancer screening purposes.  Patient denies any family history of colorectal cancer.  No melena or hematochezia.  No abdominal pain or unintentional weight loss.  No change in bowel habits.  Overall feels well from a GI standpoint.  Past Medical History:  Diagnosis Date   Arthritis    Cancer ([place])    Depression    Dyspnea    GERD (gastroesophageal reflux disease)    History of kidney stones     Past Surgical History:  Procedure Laterality Date   FRACTURE SURGERY Left 2002   [place]    Prior to Admission medications   Medication Sig Start Date End Date Taking? Authorizing Provider  acetaminophen (TYLENOL) 650 MG CR tablet Take 1,300 mg by mouth daily.   Yes [provider]  Alum Hydroxide-Mag Carbonate 160-105 MG CHEW Chew 2 tablets by mouth at bedtime.   Yes [provider]  Apoaequorin (PREVAGEN EXTRA STRENGTH) 20 MG CAPS Take 20 mg by mouth daily.   Yes [provider]  azelastine (ASTELIN) 0.1 % nasal spray Place 2 sprays into both nostrils at bedtime. Use in each nostril as directed   Yes [provider]  cholecalciferol (VITAMIN D3) 25 MCG (1000 UNIT) tablet Take 1,000 Units by mouth daily.   Yes [provider]  citalopram (CELEXA) 20 MG tablet Take 20 mg by mouth at bedtime. 08/03/22  Yes [provider]  cyanocobalamin (VITAMIN B12) 1000 MCG tablet Take 1,000 mcg by mouth daily.   Yes [provider]  levocetirizine (XYZAL) 5 MG tablet Take 5 mg by mouth daily.   Yes [provider]  meloxicam (MOBIC) 7.5 MG tablet Take 7.5 mg by mouth daily as needed for pain.   Yes [provider]  Menthol, Topical Analgesic, (BLUE-EMU MAXIMUM STRENGTH EX) Apply 1 Application topically daily as needed (pain).    Yes [provider]  omeprazole (PRILOSEC) 40 MG capsule Take 40 mg by mouth daily.   Yes [provider]    Allergies as of 07/23/2022 - Review Complete 08/11/2020  Allergen Reaction Noted   Codeine  02/11/2016    History reviewed. No pertinent family history.  Social History   Socioeconomic History   Marital status: Married    Spouse name: Not on file   Number of children: Not on file   Years of education: Not on file   Highest education level: Not on file  Occupational History   Not on file  Tobacco Use   Smoking status: Never   Smokeless tobacco: Never  Substance and Sexual Activity   Alcohol use: Yes    Comment: 18 pk. per week   Drug use: Not on file   Sexual activity: Not on file  Other Topics Concern   Not on file  Social History Narrative   Not on file   Social Determinants of Health   Financial Resource Strain: Not on file  Food Insecurity: Not on file  Transportation Needs: Not on file  Physical Activity: Not on file  Stress: Not on file  Social Connections: Not on file  Intimate Partner Violence: Not on file    Review of Systems: See HPI, otherwise negative ROS  Physical Exam: Vital signs in last 24 hours: Temp:  [98.1 F (36.7 C)] 98.1 F (36.7  C) (01/08 0800) Pulse Rate:  [73] 73 (01/08 0800) Resp:  [15] 15 (01/08 0800) BP: (149)/(73) 149/73 (01/08 0800) SpO2:  [99 %] 99 % (01/08 0800) Weight:  [113.4 kg] 113.4 kg (01/08 0800)   General:   Alert,  Well-developed, well-nourished, pleasant and cooperative in NAD Head:  Normocephalic and atraumatic. Eyes:  Sclera clear, no icterus.   Conjunctiva pink. Ears:  Normal auditory acuity. Nose:  No deformity, discharge,  or lesions. Msk:  Symmetrical without gross deformities. Normal posture. Extremities:  Without clubbing or edema. Neurologic:  Alert and  oriented x4;  grossly normal neurologically. Skin:  Intact without significant lesions or rashes. Psych:  Alert and  cooperative. Normal mood and affect.  Impression/Plan: Christian Davis is here for a colonoscopy to be performed for colon cancer screening purposes.  The risks of the procedure including infection, bleed, or perforation as well as benefits, limitations, alternatives and imponderables have been reviewed with the patient. Questions have been answered. All parties agreeable.

## 2022-08-24 LAB — SURGICAL PATHOLOGY

## 2022-08-27 ENCOUNTER — Encounter (HOSPITAL_COMMUNITY): Payer: Self-pay | Admitting: Internal Medicine

## 2022-09-14 DIAGNOSIS — D225 Melanocytic nevi of trunk: Secondary | ICD-10-CM | POA: Diagnosis not present

## 2022-09-14 DIAGNOSIS — Z1283 Encounter for screening for malignant neoplasm of skin: Secondary | ICD-10-CM | POA: Diagnosis not present

## 2022-09-14 DIAGNOSIS — X32XXXD Exposure to sunlight, subsequent encounter: Secondary | ICD-10-CM | POA: Diagnosis not present

## 2022-09-14 DIAGNOSIS — L570 Actinic keratosis: Secondary | ICD-10-CM | POA: Diagnosis not present

## 2022-10-12 DIAGNOSIS — G992 Myelopathy in diseases classified elsewhere: Secondary | ICD-10-CM | POA: Diagnosis not present

## 2022-10-12 DIAGNOSIS — I25119 Atherosclerotic heart disease of native coronary artery with unspecified angina pectoris: Secondary | ICD-10-CM | POA: Diagnosis not present

## 2022-10-12 DIAGNOSIS — M4802 Spinal stenosis, cervical region: Secondary | ICD-10-CM | POA: Diagnosis not present

## 2022-10-12 DIAGNOSIS — M25529 Pain in unspecified elbow: Secondary | ICD-10-CM | POA: Diagnosis not present

## 2022-10-12 DIAGNOSIS — Z299 Encounter for prophylactic measures, unspecified: Secondary | ICD-10-CM | POA: Diagnosis not present

## 2022-11-10 DIAGNOSIS — I25119 Atherosclerotic heart disease of native coronary artery with unspecified angina pectoris: Secondary | ICD-10-CM | POA: Diagnosis not present

## 2022-11-10 DIAGNOSIS — M25529 Pain in unspecified elbow: Secondary | ICD-10-CM | POA: Diagnosis not present

## 2022-11-10 DIAGNOSIS — Z299 Encounter for prophylactic measures, unspecified: Secondary | ICD-10-CM | POA: Diagnosis not present

## 2022-11-10 DIAGNOSIS — R5383 Other fatigue: Secondary | ICD-10-CM | POA: Diagnosis not present

## 2022-11-11 ENCOUNTER — Ambulatory Visit: Payer: Medicare Other | Attending: Internal Medicine | Admitting: Internal Medicine

## 2022-11-11 ENCOUNTER — Encounter: Payer: Self-pay | Admitting: Internal Medicine

## 2022-11-11 VITALS — BP 138/76 | HR 80 | Ht 71.0 in | Wt 266.2 lb

## 2022-11-11 DIAGNOSIS — R0609 Other forms of dyspnea: Secondary | ICD-10-CM | POA: Diagnosis not present

## 2022-11-11 DIAGNOSIS — F1010 Alcohol abuse, uncomplicated: Secondary | ICD-10-CM

## 2022-11-11 DIAGNOSIS — I209 Angina pectoris, unspecified: Secondary | ICD-10-CM

## 2022-11-11 DIAGNOSIS — R0789 Other chest pain: Secondary | ICD-10-CM

## 2022-11-11 NOTE — Patient Instructions (Signed)
Medication Instructions:  Your physician recommends that you continue on your current medications as directed. Please refer to the Current Medication list given to you today.   Labwork: none  Testing/Procedures: Your physician has requested that you have an echocardiogram. Echocardiography is a painless test that uses sound waves to create images of your heart. It provides your doctor with information about the size and shape of your heart and how well your heart's chambers and valves are working. This procedure takes approximately one hour. There are no restrictions for this procedure. Please do NOT wear cologne, perfume, aftershave, or lotions (deodorant is allowed). Please arrive 15 minutes prior to your appointment time.    Your physician has requested that you have en exercise stress myoview. For further information please visit [email or web address]. Please follow instruction sheet, as given.   Follow-Up:  Your physician recommends that you schedule a follow-up appointment in: Pending Results  Any Other Special Instructions Will Be Listed Below (If Applicable).  If you need a refill on your cardiac medications before your next appointment, please call your pharmacy.

## 2022-11-11 NOTE — Progress Notes (Signed)
Cardiology Office Note  Date: 11/11/2022   ID: Christian, Davis Dec 28, 1955, MRN [id number]  PCP:  Monico Blitz, MD  Cardiologist:  None Electrophysiologist:  None   Reason for Office Visit: Evaluation of chest pressure at the request of Dr. Manuella Ghazi   History of Present Illness: Christian Davis is a 67 y.o. male known to have self-reported pulmonary asbestosis was referred to cardiology clinic for evaluation of chest pressure.  Patient self-reported that he was diagnosed with pulmonary asbestosis and has been having SOB at baseline. He does have DOE with ADLs and does not do much. He also has been having substernal chest pressure x couple of years, occurs with exertion once per week and resolve spontaneously. Last for 5 to 10 minutes. Denies any dizziness, lightness, syncope, palpitations, leg swelling. Denied smoking cigarettes ever but used to drink beer 2 bottles every day.  Past Medical History:  Diagnosis Date   Arthritis    Cancer ([place])    Depression    Dyspnea    GERD (gastroesophageal reflux disease)    History of kidney stones     Past Surgical History:  Procedure Laterality Date   COLONOSCOPY WITH PROPOFOL N/A 08/23/2022   Procedure: COLONOSCOPY WITH PROPOFOL;  Surgeon: Eloise Harman, DO;  [facility]: [facility];  Service: Endoscopy;  Laterality: N/A;  9:45am, asa 3   FRACTURE SURGERY Left 2002   NECK SURGERY  1990   POLYPECTOMY  08/23/2022   Procedure: POLYPECTOMY;  Surgeon: Eloise Harman, DO;  Location: [facility];  Service: Endoscopy;;    Current Outpatient Medications  Medication Sig Dispense Refill   acetaminophen (TYLENOL) 650 MG CR tablet Take 1,300 mg by mouth daily.     Alum Hydroxide-Mag Carbonate 160-105 MG CHEW Chew 2 tablets by mouth at bedtime.     Apoaequorin (PREVAGEN EXTRA STRENGTH) 20 MG CAPS Take 20 mg by mouth daily.     azelastine (ASTELIN) 0.1 % nasal spray Place 2 sprays into both nostrils at bedtime. Use in each nostril as  directed     cholecalciferol (VITAMIN D3) 25 MCG (1000 UNIT) tablet Take 1,000 Units by mouth daily.     citalopram (CELEXA) 20 MG tablet Take 20 mg by mouth at bedtime.     cyanocobalamin (VITAMIN B12) 1000 MCG tablet Take 1,000 mcg by mouth daily.     gabapentin (NEURONTIN) 100 MG capsule Take 100 mg by mouth at bedtime.     levocetirizine (XYZAL) 5 MG tablet Take 5 mg by mouth daily.     meloxicam (MOBIC) 7.5 MG tablet Take 7.5 mg by mouth daily as needed for pain.     Menthol, Topical Analgesic, (BLUE-EMU MAXIMUM STRENGTH EX) Apply 1 Application topically daily as needed (pain).     omeprazole (PRILOSEC) 40 MG capsule Take 40 mg by mouth daily.     traMADol (ULTRAM) 50 MG tablet Take 50 mg by mouth as needed.     No current facility-administered medications for this visit.   Allergies:  Codeine   Social History: The patient  reports that he has never smoked. He has never used smokeless tobacco. He reports current alcohol use.   Family History: The patient's family history includes Alzheimer's disease in his mother; Hypertension in his father.   ROS:  Please see the history of present illness. Otherwise, complete review of systems is positive for none.  All other systems are reviewed and negative.   Physical Exam: VS:  BP 138/76  Pulse 80   Ht 5\' 11"  (1.803 m)   Wt 266 lb 3.2 oz (120.7 kg)   SpO2 94%   BMI 37.13 kg/m , BMI Body mass index is 37.13 kg/m.  Wt Readings from Last 3 Encounters:  11/11/22 266 lb 3.2 oz (120.7 kg)  08/23/22 250 lb (113.4 kg)  06/25/22 250 lb (113.4 kg)    General: Patient appears comfortable at rest. HEENT: Conjunctiva and lids normal, oropharynx clear with moist mucosa. Neck: Supple, no elevated JVP or carotid bruits, no thyromegaly. Lungs: Clear to auscultation, nonlabored breathing at rest. Cardiac: Regular rate and rhythm, no S3 or significant systolic murmur, no pericardial rub. Abdomen: Soft, nontender, no hepatomegaly, bowel sounds  present, no guarding or rebound. Extremities: No pitting edema, distal pulses 2+. Skin: Warm and dry. Musculoskeletal: No kyphosis. Neuropsychiatric: Alert and oriented x3, affect grossly appropriate.  ECG:  NSR  Recent Labwork: No results found for requested labs within last 365 days.     Component Value Date/Time   CHOL 206 (H) 01/28/2020 0921   TRIG 102 01/28/2020 0921   HDL 53 01/28/2020 0921   CHOLHDL 3.9 01/28/2020 0921   LDLCALC 135 (H) 01/28/2020 0921    Other Studies Reviewed Today:   Assessment and Plan: Patient is a 68 year old M known to have self-reported pulmonary asbestosis was referred to cardiology clinic for evaluation of chest pressure.  # Chest pressure # DOE -Patient has substernal chest pressure x couple of years, occurs with exertion once per week and resolve spontaneously. Last for 5 to 10 minutes. He also has DOE although pulmonary asbestosis could be contributing, CAD cannot be ruled out. There was CT chest evidence of moderate coronary calcifications from 12/2021. He will benefit from [place] and 2D echocardiogram.  # Alcohol abuse -Patient drinks beer 2 bottles every day and has been drinking for the last 18 years.  Educated to cut down on alcohol use. Voiced understanding.  I have spent a total of 45 minutes with patient reviewing chart, EKGs, labs and examining patient as well as establishing an assessment and plan that was discussed with the patient.  > 50% of time was spent in direct patient care.      Medication Adjustments/Labs and Tests Ordered: Current medicines are reviewed at length with the patient today.  Concerns regarding medicines are outlined above.   Tests Ordered: Orders Placed This Encounter  Procedures   NM Myocar Multi W/Spect W/Wall Motion / EF   EKG 12-Lead   ECHOCARDIOGRAM COMPLETE    Medication Changes: No orders of the defined types were placed in this encounter.   Disposition:  Follow up  pending  results  Signed Jerimey Burridge Fidel Levy, MD, 11/11/2022 11:43 AM    [place] [facility] [place], [place], [place] [address]

## 2022-11-15 ENCOUNTER — Ambulatory Visit (HOSPITAL_COMMUNITY)
Admission: RE | Admit: 2022-11-15 | Discharge: 2022-11-15 | Disposition: A | Discharge status: Home / Self Care | Payer: Medicare Other | Source: Ambulatory Visit | Attending: Internal Medicine | Admitting: Internal Medicine

## 2022-11-15 ENCOUNTER — Emergency Department (HOSPITAL_COMMUNITY): Payer: Medicare Other

## 2022-11-15 ENCOUNTER — Encounter (HOSPITAL_COMMUNITY): Payer: Self-pay | Admitting: *Deleted

## 2022-11-15 ENCOUNTER — Inpatient Hospital Stay (HOSPITAL_COMMUNITY)
Admission: EM | Admit: 2022-11-15 | Discharge: 2022-11-22 | DRG: 234 | Disposition: A | Discharge status: Home / Self Care | Payer: Medicare Other | Source: Ambulatory Visit | Attending: Thoracic Surgery (Cardiothoracic Vascular Surgery) | Admitting: Thoracic Surgery (Cardiothoracic Vascular Surgery)

## 2022-11-15 ENCOUNTER — Encounter (HOSPITAL_COMMUNITY)
Admission: RE | Admit: 2022-11-15 | Discharge: 2022-11-15 | Disposition: A | Discharge status: Home / Self Care | Payer: Medicare Other | Source: Ambulatory Visit | Attending: Internal Medicine | Admitting: Internal Medicine

## 2022-11-15 ENCOUNTER — Other Ambulatory Visit: Payer: Self-pay

## 2022-11-15 ENCOUNTER — Telehealth: Payer: Self-pay | Admitting: Internal Medicine

## 2022-11-15 DIAGNOSIS — J61 Pneumoconiosis due to asbestos and other mineral fibers: Secondary | ICD-10-CM | POA: Diagnosis not present

## 2022-11-15 DIAGNOSIS — Z1152 Encounter for screening for COVID-19: Secondary | ICD-10-CM | POA: Diagnosis not present

## 2022-11-15 DIAGNOSIS — D6959 Other secondary thrombocytopenia: Secondary | ICD-10-CM | POA: Diagnosis not present

## 2022-11-15 DIAGNOSIS — K219 Gastro-esophageal reflux disease without esophagitis: Secondary | ICD-10-CM | POA: Diagnosis present

## 2022-11-15 DIAGNOSIS — E669 Obesity, unspecified: Secondary | ICD-10-CM | POA: Diagnosis present

## 2022-11-15 DIAGNOSIS — J939 Pneumothorax, unspecified: Secondary | ICD-10-CM | POA: Diagnosis not present

## 2022-11-15 DIAGNOSIS — D72829 Elevated white blood cell count, unspecified: Secondary | ICD-10-CM | POA: Diagnosis not present

## 2022-11-15 DIAGNOSIS — Z23 Encounter for immunization: Secondary | ICD-10-CM | POA: Diagnosis not present

## 2022-11-15 DIAGNOSIS — Z77090 Contact with and (suspected) exposure to asbestos: Secondary | ICD-10-CM | POA: Diagnosis not present

## 2022-11-15 DIAGNOSIS — R0789 Other chest pain: Secondary | ICD-10-CM | POA: Insufficient documentation

## 2022-11-15 DIAGNOSIS — Z87442 Personal history of urinary calculi: Secondary | ICD-10-CM

## 2022-11-15 DIAGNOSIS — I493 Ventricular premature depolarization: Secondary | ICD-10-CM | POA: Diagnosis not present

## 2022-11-15 DIAGNOSIS — F419 Anxiety disorder, unspecified: Secondary | ICD-10-CM | POA: Diagnosis present

## 2022-11-15 DIAGNOSIS — I200 Unstable angina: Secondary | ICD-10-CM | POA: Diagnosis not present

## 2022-11-15 DIAGNOSIS — E785 Hyperlipidemia, unspecified: Secondary | ICD-10-CM | POA: Diagnosis present

## 2022-11-15 DIAGNOSIS — M1990 Unspecified osteoarthritis, unspecified site: Secondary | ICD-10-CM | POA: Diagnosis present

## 2022-11-15 DIAGNOSIS — Z820 Family history of epilepsy and other diseases of the nervous system: Secondary | ICD-10-CM

## 2022-11-15 DIAGNOSIS — Z951 Presence of aortocoronary bypass graft: Secondary | ICD-10-CM

## 2022-11-15 DIAGNOSIS — E782 Mixed hyperlipidemia: Secondary | ICD-10-CM

## 2022-11-15 DIAGNOSIS — F1010 Alcohol abuse, uncomplicated: Secondary | ICD-10-CM | POA: Diagnosis present

## 2022-11-15 DIAGNOSIS — I5032 Chronic diastolic (congestive) heart failure: Secondary | ICD-10-CM | POA: Diagnosis present

## 2022-11-15 DIAGNOSIS — M25522 Pain in left elbow: Secondary | ICD-10-CM | POA: Diagnosis present

## 2022-11-15 DIAGNOSIS — D62 Acute posthemorrhagic anemia: Secondary | ICD-10-CM | POA: Diagnosis not present

## 2022-11-15 DIAGNOSIS — I110 Hypertensive heart disease with heart failure: Secondary | ICD-10-CM | POA: Diagnosis not present

## 2022-11-15 DIAGNOSIS — E871 Hypo-osmolality and hyponatremia: Secondary | ICD-10-CM | POA: Diagnosis present

## 2022-11-15 DIAGNOSIS — E7849 Other hyperlipidemia: Secondary | ICD-10-CM | POA: Diagnosis not present

## 2022-11-15 DIAGNOSIS — I214 Non-ST elevation (NSTEMI) myocardial infarction: Principal | ICD-10-CM | POA: Diagnosis present

## 2022-11-15 DIAGNOSIS — R079 Chest pain, unspecified: Secondary | ICD-10-CM | POA: Diagnosis present

## 2022-11-15 DIAGNOSIS — J90 Pleural effusion, not elsewhere classified: Secondary | ICD-10-CM | POA: Diagnosis not present

## 2022-11-15 DIAGNOSIS — Z6837 Body mass index (BMI) 37.0-37.9, adult: Secondary | ICD-10-CM | POA: Diagnosis not present

## 2022-11-15 DIAGNOSIS — F32A Depression, unspecified: Secondary | ICD-10-CM | POA: Diagnosis not present

## 2022-11-15 DIAGNOSIS — I2510 Atherosclerotic heart disease of native coronary artery without angina pectoris: Secondary | ICD-10-CM | POA: Diagnosis not present

## 2022-11-15 DIAGNOSIS — R9439 Abnormal result of other cardiovascular function study: Secondary | ICD-10-CM | POA: Diagnosis not present

## 2022-11-15 DIAGNOSIS — I25110 Atherosclerotic heart disease of native coronary artery with unstable angina pectoris: Secondary | ICD-10-CM | POA: Diagnosis present

## 2022-11-15 DIAGNOSIS — Z85828 Personal history of other malignant neoplasm of skin: Secondary | ICD-10-CM

## 2022-11-15 DIAGNOSIS — Z79899 Other long term (current) drug therapy: Secondary | ICD-10-CM

## 2022-11-15 DIAGNOSIS — J9811 Atelectasis: Secondary | ICD-10-CM | POA: Diagnosis not present

## 2022-11-15 DIAGNOSIS — Z8249 Family history of ischemic heart disease and other diseases of the circulatory system: Secondary | ICD-10-CM

## 2022-11-15 DIAGNOSIS — F514 Sleep terrors [night terrors]: Secondary | ICD-10-CM | POA: Diagnosis not present

## 2022-11-15 DIAGNOSIS — T502X5A Adverse effect of carbonic-anhydrase inhibitors, benzothiadiazides and other diuretics, initial encounter: Secondary | ICD-10-CM | POA: Diagnosis not present

## 2022-11-15 DIAGNOSIS — I252 Old myocardial infarction: Secondary | ICD-10-CM | POA: Diagnosis not present

## 2022-11-15 DIAGNOSIS — I088 Other rheumatic multiple valve diseases: Secondary | ICD-10-CM | POA: Diagnosis not present

## 2022-11-15 DIAGNOSIS — I255 Ischemic cardiomyopathy: Secondary | ICD-10-CM | POA: Diagnosis not present

## 2022-11-15 DIAGNOSIS — I25119 Atherosclerotic heart disease of native coronary artery with unspecified angina pectoris: Secondary | ICD-10-CM | POA: Diagnosis not present

## 2022-11-15 DIAGNOSIS — Z01810 Encounter for preprocedural cardiovascular examination: Secondary | ICD-10-CM | POA: Diagnosis not present

## 2022-11-15 DIAGNOSIS — Z885 Allergy status to narcotic agent status: Secondary | ICD-10-CM

## 2022-11-15 DIAGNOSIS — R531 Weakness: Secondary | ICD-10-CM | POA: Diagnosis not present

## 2022-11-15 DIAGNOSIS — M25521 Pain in right elbow: Secondary | ICD-10-CM | POA: Diagnosis present

## 2022-11-15 DIAGNOSIS — I150 Renovascular hypertension: Secondary | ICD-10-CM | POA: Diagnosis not present

## 2022-11-15 DIAGNOSIS — F418 Other specified anxiety disorders: Secondary | ICD-10-CM | POA: Diagnosis not present

## 2022-11-15 HISTORY — DX: Atherosclerotic heart disease of native coronary artery without angina pectoris: I25.10

## 2022-11-15 HISTORY — DX: Contact with and (suspected) exposure to asbestos: Z77.090

## 2022-11-15 LAB — CBC
HCT: 48.2 % (ref 39.0–52.0)
Hemoglobin: 16.3 g/dL (ref 13.0–17.0)
MCH: 32.1 pg (ref 26.0–34.0)
MCHC: 33.8 g/dL (ref 30.0–36.0)
MCV: 94.9 fL (ref 80.0–100.0)
Platelets: 192 10*3/uL (ref 150–400)
RBC: 5.08 MIL/uL (ref 4.22–5.81)
RDW: 12.6 % (ref 11.5–15.5)
WBC: 6.8 10*3/uL (ref 4.0–10.5)
nRBC: 0 % (ref 0.0–0.2)

## 2022-11-15 LAB — NM MYOCAR MULTI W/SPECT W/WALL MOTION / EF
Base ST Depression (mm): 0 mm
LV dias vol: 111 mL (ref 62–150)
LV sys vol: 56 mL
Nuc Stress EF: 51 %
Peak HR: 90 {beats}/min
RATE: 0.5
Rest HR: 71 {beats}/min
Rest Nuclear Isotope Dose: 10.4 mCi
SDS: 13
SRS: 3
SSS: 16
ST Depression (mm): 1 mm
Stress Nuclear Isotope Dose: 31 mCi
TID: 1.13

## 2022-11-15 LAB — TROPONIN I (HIGH SENSITIVITY)
Troponin I (High Sensitivity): 9 ng/L (ref <18)
Troponin I (High Sensitivity): 36 ng/L — ABNORMAL HIGH (ref <18)

## 2022-11-15 LAB — BASIC METABOLIC PANEL
Anion gap: 10 (ref 5–15)
BUN: 14 mg/dL (ref 8–23)
CO2: 26 mmol/L (ref 22–32)
Calcium: 8.9 mg/dL (ref 8.9–10.3)
Chloride: 99 mmol/L (ref 98–111)
Creatinine, Ser: 0.89 mg/dL (ref 0.61–1.24)
GFR, Estimated: >60 mL/min (ref >60)
Glucose, Bld: 125 mg/dL — ABNORMAL HIGH (ref 70–99)
Potassium: 3.9 mmol/L (ref 3.5–5.1)
Sodium: 135 mmol/L (ref 135–145)

## 2022-11-15 LAB — HIV ANTIBODY (ROUTINE TESTING W REFLEX): HIV Screen 4th Generation wRfx: NONREACTIVE

## 2022-11-15 LAB — TSH: TSH: 3.896 u[IU]/mL (ref 0.350–4.500)

## 2022-11-15 MED ORDER — SODIUM CHLORIDE 0.9 % WEIGHT BASED INFUSION
3.0000 mL/kg/h | INTRAVENOUS | Status: DC
  Administered 2022-11-16: 3 mL/kg/h via INTRAVENOUS

## 2022-11-15 MED ORDER — NITROGLYCERIN 0.4 MG SL SUBL: 0.4000 mg | SUBLINGUAL_TABLET | SUBLINGUAL | Status: DC | PRN

## 2022-11-15 MED ORDER — TECHNETIUM TC 99M TETROFOSMIN IV KIT
10.0000 | PACK | Freq: Once | INTRAVENOUS | Status: AC | PRN
  Administered 2022-11-15: 10.39 via INTRAVENOUS

## 2022-11-15 MED ORDER — PANTOPRAZOLE SODIUM 40 MG PO TBEC
40.0000 mg | DELAYED_RELEASE_TABLET | Freq: Every day | ORAL | Status: DC
  Administered 2022-11-15 – 2022-11-17 (×3): 40 mg via ORAL
  Filled 2022-11-15 (×3): qty 1

## 2022-11-15 MED ORDER — ACETAMINOPHEN 325 MG PO TABS: 650.0000 mg | ORAL_TABLET | ORAL | Status: DC | PRN

## 2022-11-15 MED ORDER — ASPIRIN 81 MG PO CHEW
324.0000 mg | CHEWABLE_TABLET | ORAL | Status: AC
  Administered 2022-11-15: 324 mg via ORAL
  Filled 2022-11-15: qty 4

## 2022-11-15 MED ORDER — HEPARIN BOLUS VIA INFUSION
4000.0000 [IU] | Freq: Once | INTRAVENOUS | Status: AC
  Administered 2022-11-15: 4000 [IU] via INTRAVENOUS

## 2022-11-15 MED ORDER — PNEUMOCOCCAL 20-VAL CONJ VACC 0.5 ML IM SUSY
0.5000 mL | PREFILLED_SYRINGE | INTRAMUSCULAR | Status: AC
  Administered 2022-11-17: 0.5 mL via INTRAMUSCULAR
  Filled 2022-11-15: qty 0.5

## 2022-11-15 MED ORDER — SODIUM CHLORIDE 0.9% FLUSH
3.0000 mL | Freq: Two times a day (BID) | INTRAVENOUS | Status: DC
  Administered 2022-11-17: 3 mL via INTRAVENOUS

## 2022-11-15 MED ORDER — CITALOPRAM HYDROBROMIDE 20 MG PO TABS
20.0000 mg | ORAL_TABLET | Freq: Every day | ORAL | Status: DC
  Administered 2022-11-15 – 2022-11-17 (×3): 20 mg via ORAL
  Filled 2022-11-15 (×3): qty 1

## 2022-11-15 MED ORDER — ASPIRIN 300 MG RE SUPP: 300.0000 mg | RECTAL | Status: AC

## 2022-11-15 MED ORDER — SODIUM CHLORIDE 0.9% FLUSH: 3.0000 mL | INTRAVENOUS | Status: DC | PRN

## 2022-11-15 MED ORDER — ROSUVASTATIN CALCIUM 20 MG PO TABS
40.0000 mg | ORAL_TABLET | Freq: Every day | ORAL | Status: DC
  Administered 2022-11-15 – 2022-11-17 (×3): 40 mg via ORAL
  Filled 2022-11-15 (×3): qty 2

## 2022-11-15 MED ORDER — TECHNETIUM TC 99M TETROFOSMIN IV KIT
30.0000 | PACK | Freq: Once | INTRAVENOUS | Status: AC | PRN
  Administered 2022-11-15: 31 via INTRAVENOUS

## 2022-11-15 MED ORDER — ASPIRIN 81 MG PO CHEW
81.0000 mg | CHEWABLE_TABLET | ORAL | Status: AC
  Administered 2022-11-16: 81 mg via ORAL
  Filled 2022-11-15: qty 1

## 2022-11-15 MED ORDER — REGADENOSON 0.4 MG/5ML IV SOLN
INTRAVENOUS | Status: AC
  Administered 2022-11-15: 0.4 mg via INTRAVENOUS
  Filled 2022-11-15: qty 5

## 2022-11-15 MED ORDER — SODIUM CHLORIDE FLUSH 0.9 % IV SOLN
INTRAVENOUS | Status: AC
  Administered 2022-11-15: 10 mL via INTRAVENOUS
  Filled 2022-11-15: qty 10

## 2022-11-15 MED ORDER — ASPIRIN 81 MG PO CHEW
324.0000 mg | CHEWABLE_TABLET | Freq: Once | ORAL | Status: AC
  Administered 2022-11-15: 324 mg via ORAL
  Filled 2022-11-15: qty 4

## 2022-11-15 MED ORDER — SODIUM CHLORIDE 0.9 % IV SOLN: 250.0000 mL | INTRAVENOUS | Status: DC | PRN

## 2022-11-15 MED ORDER — METOPROLOL TARTRATE 25 MG PO TABS
25.0000 mg | ORAL_TABLET | Freq: Once | ORAL | Status: AC
  Administered 2022-11-15: 25 mg via ORAL
  Filled 2022-11-15: qty 1

## 2022-11-15 MED ORDER — ONDANSETRON HCL 4 MG/2ML IJ SOLN: 4.0000 mg | Freq: Four times a day (QID) | INTRAMUSCULAR | Status: DC | PRN

## 2022-11-15 MED ORDER — HEPARIN (PORCINE) 25000 UT/250ML-% IV SOLN
1250.0000 [IU]/h | INTRAVENOUS | Status: DC
  Administered 2022-11-15: 1250 [IU]/h via INTRAVENOUS
  Filled 2022-11-15: qty 250

## 2022-11-15 MED ORDER — METOPROLOL SUCCINATE ER 25 MG PO TB24
25.0000 mg | ORAL_TABLET | Freq: Every day | ORAL | Status: DC
  Administered 2022-11-15 – 2022-11-17 (×3): 25 mg via ORAL
  Filled 2022-11-15 (×3): qty 1

## 2022-11-15 MED ORDER — ASPIRIN 81 MG PO TBEC
81.0000 mg | DELAYED_RELEASE_TABLET | Freq: Every day | ORAL | Status: DC
  Administered 2022-11-17: 81 mg via ORAL
  Filled 2022-11-15 (×2): qty 1

## 2022-11-15 MED ORDER — SODIUM CHLORIDE 0.9 % WEIGHT BASED INFUSION
1.0000 mL/kg/h | INTRAVENOUS | Status: DC
  Administered 2022-11-16: 1 mL/kg/h via INTRAVENOUS

## 2022-11-15 NOTE — Progress Notes (Signed)
ANTICOAGULATION CONSULT NOTE - Initial Consult  Pharmacy Consult for heparin Indication: chest pain/ACS  Allergies  Allergen Reactions   Codeine Anxiety    Patient Measurements: Height: 5\' 11"  (180.3 cm) Weight: 120.7 kg (266 lb 3.2 oz) IBW/kg (Calculated) : 75.3 Heparin Dosing Weight: 102 kg  Vital Signs: Temp: 98.1 F (36.7 C) (04/01 1504) Temp Source: Oral (04/01 1504) BP: 144/81 (04/01 1500) Pulse Rate: 64 (04/01 1500)  Labs: Recent Labs    11/15/22 1108 11/15/22 1312  HGB 16.3  --   HCT 48.2  --   PLT 192  --   CREATININE 0.89  --   TROPONINIHS 9 36*    Estimated Creatinine Clearance: 109.4 mL/min (by C-G formula based on SCr of 0.89 mg/dL).   Medical History: Past Medical History:  Diagnosis Date   Arthritis    Cancer    Depression    Dyspnea    GERD (gastroesophageal reflux disease)    H/O asbestos exposure    History of kidney stones     Medications:  (Not in a hospital admission)   Assessment: Pharmacy consulted to dose heparin in patient with chest pain/ACS.  Patient is not on anticoagulation prior to admission.  CBC WNL Trop 36  Goal of Therapy:  Heparin level 0.3-0.7 units/ml Monitor platelets by anticoagulation protocol: Yes   Plan:  Give 4000 units bolus x 1 Start heparin infusion at 1250 units/hr Check anti-Xa level in 6 hours and daily while on heparin Continue to monitor H&H and platelets  Margot Ables, PharmD Clinical Pharmacist 11/15/2022 3:53 PM

## 2022-11-15 NOTE — H&P (Signed)
Cardiology Admission History and Physical:   Patient ID: Christian Davis; [id number]; 04-17-1956   Admission date: 11/15/2022  Primary Care Provider: Monico Blitz, MD Primary Cardiologist: Chalmers Guest, MD  Chief Complaint: Recurrent chest and arm discomfort  History of Present Illness:   [name] is a 67 y.o. male with a reported history of pulmonary asbestosis followed by [facility] in [place] with serial chest CT imaging.  He presents to the [place] today following outpatient exercise Myoview given prolonged chest discomfort after the study.  He describes a history of recurring bilateral elbow and chest aching with exertion for several months, worse the last several weeks and more recently happening on a daily basis.  He was evaluated by Dr. Dellia Cloud recently on March 28.  Last chest CT in May 2023 described stable moderate coronary artery calcification.  Today's exercise Myoview demonstrated borderline ST segment abnormalities in the setting of angina with perfusion defects indicating fairly large region of ischemia in the apical to basal inferolateral wall, smaller ischemic distribution in the apical inferoseptal wall.  LVEF 51%.  On my assessment in the ER he was symptom-free.  High-sensitivity troponin I levels increasing from 9 to 36.  Follow-up ECG shows sinus rhythm with occasional PVCs and nonspecific ST changes.  Chest x-ray reports no acute process.  ROS:  Pertinent review in the history of present illness.  No palpitations or syncope.  No claudication.  No orthopnea or PND.  No cough or hemoptysis.  Past Medical History:  Diagnosis Date   Arthritis    Cancer    Coronary artery calcification seen on CT scan    Depression    GERD (gastroesophageal reflux disease)    H/O asbestos exposure    History of kidney stones     Past Surgical History:  Procedure Laterality Date   COLONOSCOPY WITH PROPOFOL N/A 08/23/2022   Procedure: COLONOSCOPY WITH  PROPOFOL;  Surgeon: Eloise Harman, DO;  [facility];  Service: Endoscopy;  Laterality: N/A;  9:45am, asa 3   FRACTURE SURGERY Left 2002   NECK SURGERY  1990   POLYPECTOMY  08/23/2022   Procedure: POLYPECTOMY;  Surgeon: Eloise Harman, DO;  Location: [facility];  Service: Endoscopy;;     Social History:   Social History   Tobacco Use   Smoking status: Never   Smokeless tobacco: Never  Substance Use Topics   Alcohol use: Yes    Alcohol/week: 21.0 standard drinks of alcohol    Types: 21 Cans of beer per week    Comment: 3 beers per day    Family History: The patient's family history includes Alzheimer's disease in his mother; Hypertension in his father.    Medications Prior to Admission: Prior to Admission medications   Medication Sig Start Date End Date Taking? Authorizing Provider  acetaminophen (TYLENOL) 650 MG CR tablet Take 1,300 mg by mouth daily.   Yes [provider]  Apoaequorin (PREVAGEN EXTRA STRENGTH) 20 MG CAPS Take 20 mg by mouth daily.   Yes [provider]  azelastine (ASTELIN) 0.1 % nasal spray Place 2 sprays into both nostrils at bedtime. Use in each nostril as directed   Yes [provider]  cholecalciferol (VITAMIN D3) 25 MCG (1000 UNIT) tablet Take 1,000 Units by mouth daily.   Yes [provider]  citalopram (CELEXA) 20 MG tablet Take 20 mg by mouth daily. 08/03/22  Yes [provider]  cyanocobalamin (VITAMIN B12) 1000 MCG tablet Take  1,000 mcg by mouth daily.   Yes [provider]  gabapentin (NEURONTIN) 100 MG capsule Take 100 mg by mouth at bedtime. 10/12/22  Yes [provider]  levocetirizine (XYZAL) 5 MG tablet Take 5 mg by mouth daily.   Yes [provider]  meloxicam (MOBIC) 7.5 MG tablet Take 7.5 mg by mouth daily as needed for pain.   Yes [provider]  Menthol, Topical Analgesic, (BLUE-EMU MAXIMUM STRENGTH EX) Apply 1 Application topically daily as  needed (pain).   Yes [provider]  omeprazole (PRILOSEC) 40 MG capsule Take 40 mg by mouth daily.   Yes [provider]  traMADol (ULTRAM) 50 MG tablet Take 50 mg by mouth as needed. 11/10/22  Yes [provider]     Allergies:    Allergies  Allergen Reactions   Codeine Anxiety    Physical Exam/Data:   Vitals:   11/15/22 1400 11/15/22 1430 11/15/22 1500 11/15/22 1504  BP: (!) 147/94 (!) 147/73 (!) 144/81   Pulse: 71 61 64   Resp: 17 16 15    Temp:    98.1 F (36.7 C)  TempSrc:    Oral  SpO2: 96% 98% 99%   Weight:      Height:       No intake or output data in the 24 hours ending 11/15/22 1632 Filed Weights   11/15/22 1054  Weight: 120.7 kg   Body mass index is 37.13 kg/m.   Gen: Patient appears comfortable at rest. HEENT: Conjunctiva and lids normal Neck: Supple, no elevated JVP or carotid bruits. Lungs: Clear to auscultation, nonlabored breathing at rest. Cardiac: Regular rate and rhythm, no S3 or significant systolic murmur, no pericardial rub. Abdomen: Soft, nontender, bowel sounds present, no guarding or rebound. Extremities: No pitting edema, distal pulses 2+. Skin: Warm and dry. Musculoskeletal: No kyphosis. Neuropsychiatric: Alert and oriented x3, affect grossly appropriate.  Relevant CV Studies:  Echocardiogram pending.  Laboratory Data:  Chemistry Recent Labs  Lab 11/15/22 1108  NA 135  K 3.9  CL 99  CO2 26  GLUCOSE 125*  BUN 14  CREATININE 0.89  CALCIUM 8.9  GFRNONAA >60  ANIONGAP 10     Hematology Recent Labs  Lab 11/15/22 1108  WBC 6.8  RBC 5.08  HGB 16.3  HCT 48.2  MCV 94.9  MCH 32.1  MCHC 33.8  RDW 12.6  PLT 192   Cardiac Enzymes Recent Labs  Lab 11/15/22 1108 11/15/22 1312  TROPONINIHS 9 36*   Lipid Panel     Component Value Date/Time   CHOL 206 (H) 01/28/2020 0921   TRIG 102 01/28/2020 0921   HDL 53 01/28/2020 0921   CHOLHDL 3.9 01/28/2020 0921   LDLCALC 135 (H) 01/28/2020 0921    LABVLDL 18 01/28/2020 0921    Radiology/Studies:  DG Chest Port 1 View  Result Date: 11/15/2022 CLINICAL DATA:  Chest pain EXAM: PORTABLE CHEST 1 VIEW COMPARISON:  12/04/2008 FINDINGS: The heart size and mediastinal contours are within normal limits. Slightly low lung volumes. No focal airspace consolidation, pleural effusion, or pneumothorax. The visualized skeletal structures are unremarkable. IMPRESSION: No active disease. Electronically Signed   By: Davina Poke D.O.   On: 11/15/2022 11:31   NM Myocar Multi W/Spect W/Wall Motion / EF  Result Date: 11/15/2022   Stress ECG is borderline positive for ischemia due to findings of 1.0 mm of horizontal ST depression noted in lead II with stress that persisted into recovery.   Patient had substernal chest  tightness that occurred in stress, continued into recovery and after nuclear scanning. Chest pain resolved but patient sent to ER due to prolonged episode of chest tightness.   There is a medium sized reversible perfusion defect present in the apical to mid anterolateral and inferolateral location consistent with ischemia.There is another medium sized reversible defect present in the apical to basal inferior location consistent with ischemia.   Left ventricular function is abnormal. Nuclear stress EF: 51 %. Consider 2D Echocardiogram for accurate estimation of LVEF.   Findings are consistent with ischemia. The study is high risk.    Assessment and Plan:   1.  Unstable angina with baseline history of moderate coronary artery calcification by CT imaging in May of last year and exercise Myoview done earlier today showing fairly large region of inferolateral ischemia, small area at the inferoseptal apex.  LVEF 51% with follow-up echocardiogram pending.  2.  Reported history of asbestos exposure, followed by [facility] in the [place].  Chest CT imaging from May of last year showed no suspicious nodules or evidence of malignancy.  3.  LDL 135 in  2021 with repeat FLP pending.  He has not been on statin therapy as an outpatient.  4.  GERD on Prilosec.  5.  Regular alcohol intake, 2-3 beers daily.  Plan is to transfer to our cardiology service at [facility], Dr. Audie Box is the accepting physician.  We discussed the risks and benefits of a diagnostic cardiac catheterization with eye toward revascularization strategies and he is in agreement to proceed.  Starting heparin, otherwise treat with aspirin and high-dose statin for now.  Obtain echocardiogram while hospitalized.  Further recommendations to follow.  Signed, Rozann Lesches, MD  11/15/2022 4:32 PM

## 2022-11-15 NOTE — Telephone Encounter (Signed)
Patient currently admitted to [facility] as of 4/1, Heart cath scheduled for 4/2.

## 2022-11-15 NOTE — ED Notes (Signed)
I stat machine would not work  Na  134 K      7.1 C1   106 Ica   1.05 TC02  25 Glu    180 Bun   39 Crea   1.4 Hct      40 Hb      13.6 AnGap  12

## 2022-11-15 NOTE — ED Triage Notes (Signed)
Pt brought over from Nuclear Medicine after having severe episode of mid chest pain when about to leave from his Stress Test. Pt denies radiation of pain, SOB, lightheadedness, diaphoresis.

## 2022-11-15 NOTE — ED Notes (Signed)
Attempted to call [facility] staff to give report and staff advised to call back.

## 2022-11-15 NOTE — Telephone Encounter (Signed)
Pt wife returning call for stress test results

## 2022-11-15 NOTE — ED Provider Notes (Signed)
[facility] Provider Note   CSN: [id number] Arrival date & time: 11/15/22  1040     History  Chief Complaint  Patient presents with   Chest Pain    Christian Davis is a 67 y.o. male.  He is here for evaluation of chest pain while he had just finished his nuclear stress test at the hospital today.  He has been having ongoing elbow pain bilaterally for a few years and for the few months has had intermittent chest pressure and pain.  He saw cardiology and had an outpatient nuclear test done today.  He said after the test he was returning from the bathroom when he felt like somebody punched him in the chest.  Symptoms lasted just a few seconds and have completely resolved.  He denies any radiation of his pain any diaphoresis shortness of breath syncope.  The history is provided by the patient.  Chest Pain Pain location:  Substernal area Pain quality: aching   Pain radiates to:  Does not radiate Pain severity:  Moderate Onset quality:  Sudden Duration: seconds. Progression:  Resolved Relieved by:  None tried Worsened by:  Nothing Ineffective treatments:  None tried Associated symptoms: no abdominal pain, no cough, no diaphoresis, no fever, no nausea, no shortness of breath and no vomiting   Risk factors: male sex   Risk factors: no smoking        Home Medications Prior to Admission medications   Medication Sig Start Date End Date Taking? Authorizing Provider  acetaminophen (TYLENOL) 650 MG CR tablet Take 1,300 mg by mouth daily.    [provider]  Alum Hydroxide-Mag Carbonate 160-105 MG CHEW Chew 2 tablets by mouth at bedtime.    [provider]  Apoaequorin (PREVAGEN EXTRA STRENGTH) 20 MG CAPS Take 20 mg by mouth daily.    [provider]  azelastine (ASTELIN) 0.1 % nasal spray Place 2 sprays into both nostrils at bedtime. Use in each nostril as directed    [provider]  cholecalciferol (VITAMIN  D3) 25 MCG (1000 UNIT) tablet Take 1,000 Units by mouth daily.    [provider]  citalopram (CELEXA) 20 MG tablet Take 20 mg by mouth at bedtime. 08/03/22   [provider]  cyanocobalamin (VITAMIN B12) 1000 MCG tablet Take 1,000 mcg by mouth daily.    [provider]  gabapentin (NEURONTIN) 100 MG capsule Take 100 mg by mouth at bedtime. 10/12/22   [provider]  levocetirizine (XYZAL) 5 MG tablet Take 5 mg by mouth daily.    [provider]  meloxicam (MOBIC) 7.5 MG tablet Take 7.5 mg by mouth daily as needed for pain.    [provider]  Menthol, Topical Analgesic, (BLUE-EMU MAXIMUM STRENGTH EX) Apply 1 Application topically daily as needed (pain).    [provider]  omeprazole (PRILOSEC) 40 MG capsule Take 40 mg by mouth daily.    [provider]  traMADol (ULTRAM) 50 MG tablet Take 50 mg by mouth as needed. 11/10/22   [provider]      Allergies    Codeine    Review of Systems   Review of Systems  Constitutional:  Negative for diaphoresis and fever.  Respiratory:  Negative for cough and shortness of breath.   Cardiovascular:  Positive for chest pain.  Gastrointestinal:  Negative for abdominal pain, nausea and vomiting.    Physical Exam Updated Vital Signs BP (!) 158/81 (BP Location: Left  Arm)   Pulse 96   Temp 97.6 F (36.4 C) (Oral)   Resp 18   Ht 5\' 11"  (1.803 m)   Wt 120.7 kg   SpO2 99%   BMI 37.13 kg/m  Physical Exam Vitals and nursing note reviewed.  Constitutional:      General: He is not in acute distress.    Appearance: He is well-developed.  HENT:     Head: Normocephalic and atraumatic.  Eyes:     Conjunctiva/sclera: Conjunctivae normal.  Cardiovascular:     Rate and Rhythm: Normal rate and regular rhythm.     Heart sounds: Normal heart sounds. No murmur heard. Pulmonary:     Effort: Pulmonary effort is normal. No respiratory distress.     Breath sounds: Normal breath  sounds.  Abdominal:     Palpations: Abdomen is soft.     Tenderness: There is no abdominal tenderness. There is no guarding or rebound.  Musculoskeletal:        General: No swelling.     Cervical back: Neck supple.     Right lower leg: No tenderness. Edema present.     Left lower leg: No tenderness. Edema present.  Skin:    General: Skin is warm and dry.     Capillary Refill: Capillary refill takes less than 2 seconds.  Neurological:     General: No focal deficit present.     Mental Status: He is alert.     ED Results / Procedures / Treatments   Labs (all labs ordered are listed, but only abnormal results are displayed) Labs Reviewed  BASIC METABOLIC PANEL - Abnormal; Notable for the following components:      Result Value   Glucose, Bld 125 (*)    All other components within normal limits  TROPONIN I (HIGH SENSITIVITY) - Abnormal; Notable for the following components:   Troponin I (High Sensitivity) 36 (*)    All other components within normal limits  CBC  TSH  HEPARIN LEVEL (UNFRACTIONATED)  HIV ANTIBODY (ROUTINE TESTING W REFLEX)  HEMOGLOBIN A1C  HEPARIN LEVEL (UNFRACTIONATED)  CBC  LIPOPROTEIN A (LPA)  BASIC METABOLIC PANEL  LIPID PANEL  TROPONIN I (HIGH SENSITIVITY)    EKG EKG Interpretation  Date/Time:  Monday November 15 2022 10:50:52 EDT Ventricular Rate:  95 PR Interval:  181 QRS Duration: 103 QT Interval:  371 QTC Calculation: 467 R Axis:   86 Text Interpretation: Sinus rhythm Ventricular premature complex Borderline right axis deviation Nonspecific repol abnormality, diffuse leads nonspecific changes from prior 4/10 Confirmed by Aletta Edouard [phone]) on 11/15/2022 11:24:50 AM  Radiology DG Chest Port 1 View  Result Date: 11/15/2022 CLINICAL DATA:  Chest pain EXAM: PORTABLE CHEST 1 VIEW COMPARISON:  12/04/2008 FINDINGS: The heart size and mediastinal contours are within normal limits. Slightly low lung volumes. No focal airspace consolidation, pleural  effusion, or pneumothorax. The visualized skeletal structures are unremarkable. IMPRESSION: No active disease. Electronically Signed   By: Davina Poke D.O.   On: 11/15/2022 11:31   NM Myocar Multi W/Spect W/Wall Motion / EF  Result Date: 11/15/2022   Stress ECG is borderline positive for ischemia due to findings of 1.0 mm of horizontal ST depression noted in lead II with stress that persisted into recovery.   Patient had substernal chest tightness that occurred in stress, continued into recovery and after nuclear scanning. Chest pain resolved but patient sent to ER due to prolonged episode of chest tightness.   There is a medium sized reversible perfusion  defect present in the apical to mid anterolateral and inferolateral location consistent with ischemia.There is another medium sized reversible defect present in the apical to basal inferior location consistent with ischemia.   Left ventricular function is abnormal. Nuclear stress EF: 51 %. Consider 2D Echocardiogram for accurate estimation of LVEF.   Findings are consistent with ischemia. The study is high risk.    Procedures .Critical Care  Performed by: Hayden Rasmussen, MD Authorized by: Hayden Rasmussen, MD   Critical care provider statement:    Critical care time (minutes):  45   Critical care time was exclusive of:  Separately billable procedures and treating other patients   Critical care was necessary to treat or prevent imminent or life-threatening deterioration of the following conditions:  Cardiac failure   Critical care was time spent personally by me on the following activities:  Development of treatment plan with patient or surrogate, discussions with consultants, evaluation of patient's response to treatment, examination of patient, obtaining history from patient or surrogate, ordering and performing treatments and interventions, ordering and review of laboratory studies, ordering and review of radiographic studies, pulse  oximetry, re-evaluation of patient's condition and review of old charts   I assumed direction of critical care for this patient from another provider in my specialty: no       Medications Ordered in ED Medications  heparin ADULT infusion 100 units/mL (25000 units/217mL) (1,250 Units/hr Intravenous New Bag/Given 11/15/22 1628)  citalopram (CELEXA) tablet 20 mg (has no administration in time range)  pantoprazole (PROTONIX) EC tablet 40 mg (40 mg Oral Given 11/15/22 1659)  aspirin EC tablet 81 mg (has no administration in time range)  nitroGLYCERIN (NITROSTAT) SL tablet 0.4 mg (has no administration in time range)  acetaminophen (TYLENOL) tablet 650 mg (has no administration in time range)  ondansetron (ZOFRAN) injection 4 mg (has no administration in time range)  sodium chloride flush (NS) 0.9 % injection 3 mL (has no administration in time range)  sodium chloride flush (NS) 0.9 % injection 3 mL (has no administration in time range)  0.9 %  sodium chloride infusion (has no administration in time range)  rosuvastatin (CRESTOR) tablet 40 mg (40 mg Oral Given 11/15/22 1752)  metoprolol succinate (TOPROL-XL) 24 hr tablet 25 mg (25 mg Oral Given 11/15/22 1659)  sodium chloride flush (NS) 0.9 % injection 3 mL (has no administration in time range)  sodium chloride flush (NS) 0.9 % injection 3 mL (has no administration in time range)  0.9 %  sodium chloride infusion (has no administration in time range)  aspirin chewable tablet 81 mg (has no administration in time range)  0.9% sodium chloride infusion (has no administration in time range)    Followed by  0.9% sodium chloride infusion (has no administration in time range)  aspirin chewable tablet 324 mg (324 mg Oral Given 11/15/22 1208)  metoprolol tartrate (LOPRESSOR) tablet 25 mg (25 mg Oral Given 11/15/22 1333)  heparin bolus via infusion 4,000 Units (4,000 Units Intravenous Bolus from Bag 11/15/22 1629)  aspirin chewable tablet 324 mg (324 mg Oral Given  11/15/22 1658)    Or  aspirin suppository 300 mg ( Rectal See Alternative 11/15/22 1658)    ED Course/ Medical Decision Making/ A&P Clinical Course as of 11/15/22 1810  Mon Nov 15, 2022  1124 Chest x-ray interpreted by me as cardiomegaly no gross infiltrate.  Awaiting radiology reading. [MB]  1234 Discussed with cardiology Dr. Dellia Cloud.  She said if his second troponin is not  significantly rising that he can follow-up outpatient and the cardiology team is going to set him up with a catheterization.  She feels he probably needs to be started on some beta-blocker [MB]  1529 Patient's second troponin elevated.  I spoke with Dr. Dellia Cloud, she tells me she is not on-call this afternoon and asked me to talk with Dr. Domenic Polite.  Patient Dr. Domenic Polite and did not receive a call back.  Going through [facility] to reach cardiology on-call. [MB]  [id number] Patient seen by Dr. Domenic Polite.  Plan is to admit to [facility] for anticipated cath.  Accepted by Dr. Davina Poke. [MB]    Clinical Course User Index [MB] Hayden Rasmussen, MD                             Medical Decision Making Amount and/or Complexity of Data Reviewed Labs: ordered. Radiology: ordered.  Risk OTC drugs. Prescription drug management. Decision regarding hospitalization.   This patient complains of chest pain; this involves an extensive number of treatment Options and is a complaint that carries with it a high risk of complications and morbidity. The differential includes ACS, NSTEMI, unstable angina, reflux, musculoskeletal, PE  I ordered, reviewed and interpreted labs, which included CBC normal chemistries normal other than elevated glucose, troponins rising I ordered medication oral aspirin IV heparin oral metoprolol and reviewed PMP when indicated. I ordered imaging studies which included chest x-ray and I independently    visualized and interpreted imaging which showed no acute findings Additional history obtained from patient's  wife Previous records obtained and reviewed in epic including review of stress test done today I consulted cardiology Dr. Dellia Cloud and Dr. Domenic Polite and discussed lab and imaging findings and discussed disposition.  Cardiac monitoring reviewed, normal sinus rhythm Social determinants considered, no significant barriers Critical Interventions: Workup and management of patient's NSTEMI initiating IV heparin  After the interventions stated above, I reevaluated the patient and found patient to be pain-free at this time Admission and further testing considered, he will need admission to the hospital and the cardiology service for likely cardiac catheterization.  Patient in agreement for plan         Final Clinical Impression(s) / ED Diagnoses Final diagnoses:  NSTEMI (non-ST elevated myocardial infarction)    Rx / DC Orders ED Discharge Orders     None         Hayden Rasmussen, MD 11/15/22 [phone]

## 2022-11-16 ENCOUNTER — Inpatient Hospital Stay (HOSPITAL_COMMUNITY): Payer: Medicare Other

## 2022-11-16 ENCOUNTER — Other Ambulatory Visit (HOSPITAL_COMMUNITY): Payer: Medicare Other

## 2022-11-16 ENCOUNTER — Encounter (HOSPITAL_COMMUNITY)
Admission: EM | Disposition: A | Discharge status: Home / Self Care | Payer: Self-pay | Source: Ambulatory Visit | Attending: Thoracic Surgery (Cardiothoracic Vascular Surgery)

## 2022-11-16 ENCOUNTER — Encounter (HOSPITAL_COMMUNITY): Payer: Self-pay | Admitting: Internal Medicine

## 2022-11-16 DIAGNOSIS — I200 Unstable angina: Secondary | ICD-10-CM | POA: Diagnosis not present

## 2022-11-16 DIAGNOSIS — E7849 Other hyperlipidemia: Secondary | ICD-10-CM | POA: Diagnosis not present

## 2022-11-16 DIAGNOSIS — I214 Non-ST elevation (NSTEMI) myocardial infarction: Secondary | ICD-10-CM | POA: Diagnosis not present

## 2022-11-16 DIAGNOSIS — R9439 Abnormal result of other cardiovascular function study: Secondary | ICD-10-CM | POA: Diagnosis not present

## 2022-11-16 DIAGNOSIS — R079 Chest pain, unspecified: Secondary | ICD-10-CM

## 2022-11-16 DIAGNOSIS — I25110 Atherosclerotic heart disease of native coronary artery with unstable angina pectoris: Secondary | ICD-10-CM

## 2022-11-16 DIAGNOSIS — E785 Hyperlipidemia, unspecified: Secondary | ICD-10-CM | POA: Insufficient documentation

## 2022-11-16 HISTORY — PX: CORONARY PRESSURE/FFR STUDY: CATH118243

## 2022-11-16 HISTORY — PX: LEFT HEART CATH AND CORONARY ANGIOGRAPHY: CATH118249

## 2022-11-16 LAB — CBC
HCT: 44.9 % (ref 39.0–52.0)
HCT: 43.2 % (ref 39.0–52.0)
Hemoglobin: 14.9 g/dL (ref 13.0–17.0)
Hemoglobin: 14.9 g/dL (ref 13.0–17.0)
MCH: 31.4 pg (ref 26.0–34.0)
MCH: 32.4 pg (ref 26.0–34.0)
MCHC: 33.2 g/dL (ref 30.0–36.0)
MCHC: 34.5 g/dL (ref 30.0–36.0)
MCV: 94.5 fL (ref 80.0–100.0)
MCV: 93.9 fL (ref 80.0–100.0)
Platelets: 174 10*3/uL (ref 150–400)
Platelets: 168 10*3/uL (ref 150–400)
RBC: 4.75 MIL/uL (ref 4.22–5.81)
RBC: 4.6 MIL/uL (ref 4.22–5.81)
RDW: 12.5 % (ref 11.5–15.5)
RDW: 12.5 % (ref 11.5–15.5)
WBC: 9.7 10*3/uL (ref 4.0–10.5)
WBC: 8.5 10*3/uL (ref 4.0–10.5)
nRBC: 0 % (ref 0.0–0.2)
nRBC: 0 % (ref 0.0–0.2)

## 2022-11-16 LAB — ECHOCARDIOGRAM COMPLETE
AR max vel: 3.07 cm2
AV Area VTI: 3.07 cm2
AV Area mean vel: 2.96 cm2
AV Mean grad: 3 mmHg
AV Peak grad: 5.9 mmHg
Ao pk vel: 1.21 m/s
Area-P 1/2: 3.72 cm2
Height: 71 in
Single Plane A4C EF: 63.8 %
Weight: 4003.2 oz

## 2022-11-16 LAB — BASIC METABOLIC PANEL
Anion gap: 9 (ref 5–15)
BUN: 12 mg/dL (ref 8–23)
CO2: 25 mmol/L (ref 22–32)
Calcium: 9 mg/dL (ref 8.9–10.3)
Chloride: 101 mmol/L (ref 98–111)
Creatinine, Ser: 0.87 mg/dL (ref 0.61–1.24)
GFR, Estimated: >60 mL/min (ref >60)
Glucose, Bld: 104 mg/dL — ABNORMAL HIGH (ref 70–99)
Potassium: 4.2 mmol/L (ref 3.5–5.1)
Sodium: 135 mmol/L (ref 135–145)

## 2022-11-16 LAB — HEPARIN LEVEL (UNFRACTIONATED)
Heparin Unfractionated: 0.57 IU/mL (ref 0.30–0.70)
Heparin Unfractionated: 0.51 IU/mL (ref 0.30–0.70)
Heparin Unfractionated: 0.35 IU/mL (ref 0.30–0.70)

## 2022-11-16 LAB — LIPID PANEL
Cholesterol: 227 mg/dL — ABNORMAL HIGH (ref 0–200)
HDL: 62 mg/dL (ref >40)
LDL Cholesterol: 151 mg/dL — ABNORMAL HIGH (ref 0–99)
Total CHOL/HDL Ratio: 3.7 RATIO
Triglycerides: 71 mg/dL (ref <150)
VLDL: 14 mg/dL (ref 0–40)

## 2022-11-16 LAB — HEMOGLOBIN A1C
Hgb A1c MFr Bld: 5.9 % — ABNORMAL HIGH (ref 4.8–5.6)
Mean Plasma Glucose: 123 mg/dL

## 2022-11-16 LAB — POCT ACTIVATED CLOTTING TIME: Activated Clotting Time: 314 seconds

## 2022-11-16 SURGERY — LEFT HEART CATH AND CORONARY ANGIOGRAPHY
Anesthesia: LOCAL

## 2022-11-16 MED ORDER — LIDOCAINE HCL (PF) 1 % IJ SOLN
INTRAMUSCULAR | Status: AC
  Filled 2022-11-16: qty 30

## 2022-11-16 MED ORDER — IOHEXOL 350 MG/ML SOLN
INTRAVENOUS | Status: DC | PRN
  Administered 2022-11-16: 75 mL

## 2022-11-16 MED ORDER — HEPARIN SODIUM (PORCINE) 1000 UNIT/ML IJ SOLN
INTRAMUSCULAR | Status: DC | PRN
  Administered 2022-11-16: 6000 [IU] via INTRAVENOUS
  Administered 2022-11-16: 5000 [IU] via INTRAVENOUS

## 2022-11-16 MED ORDER — HYDRALAZINE HCL 20 MG/ML IJ SOLN: 10.0000 mg | INTRAMUSCULAR | Status: AC | PRN

## 2022-11-16 MED ORDER — FUROSEMIDE 10 MG/ML IJ SOLN
20.0000 mg | Freq: Once | INTRAMUSCULAR | Status: AC
  Administered 2022-11-16: 20 mg via INTRAVENOUS
  Filled 2022-11-16: qty 2

## 2022-11-16 MED ORDER — SODIUM CHLORIDE 0.9 % IV SOLN: 250.0000 mL | INTRAVENOUS | Status: DC | PRN

## 2022-11-16 MED ORDER — NITROGLYCERIN 1 MG/10 ML FOR IR/CATH LAB
INTRA_ARTERIAL | Status: AC
  Filled 2022-11-16: qty 10

## 2022-11-16 MED ORDER — LABETALOL HCL 5 MG/ML IV SOLN: 10.0000 mg | INTRAVENOUS | Status: AC | PRN

## 2022-11-16 MED ORDER — VERAPAMIL HCL 2.5 MG/ML IV SOLN
INTRAVENOUS | Status: AC
  Filled 2022-11-16: qty 2

## 2022-11-16 MED ORDER — LIDOCAINE HCL (PF) 1 % IJ SOLN
INTRAMUSCULAR | Status: DC | PRN
  Administered 2022-11-16: 5 mL via INTRADERMAL

## 2022-11-16 MED ORDER — NITROGLYCERIN 1 MG/10 ML FOR IR/CATH LAB
INTRA_ARTERIAL | Status: DC | PRN
  Administered 2022-11-16: 200 ug via INTRACORONARY

## 2022-11-16 MED ORDER — HEPARIN SODIUM (PORCINE) 1000 UNIT/ML IJ SOLN
INTRAMUSCULAR | Status: AC
  Filled 2022-11-16: qty 10

## 2022-11-16 MED ORDER — SODIUM CHLORIDE 0.9 % IV SOLN
INTRAVENOUS | Status: DC | PRN
  Administered 2022-11-16: 10 mL/h via INTRAVENOUS

## 2022-11-16 MED ORDER — VERAPAMIL HCL 2.5 MG/ML IV SOLN
INTRAVENOUS | Status: DC | PRN
  Administered 2022-11-16: 10 mL via INTRA_ARTERIAL

## 2022-11-16 MED ORDER — PERFLUTREN LIPID MICROSPHERE
1.0000 mL | INTRAVENOUS | Status: AC | PRN
  Administered 2022-11-16: 6 mL via INTRAVENOUS

## 2022-11-16 MED ORDER — HEPARIN (PORCINE) 25000 UT/250ML-% IV SOLN
1250.0000 [IU]/h | INTRAVENOUS | Status: DC
  Administered 2022-11-16 – 2022-11-17 (×2): 1250 [IU]/h via INTRAVENOUS
  Filled 2022-11-16 (×2): qty 250

## 2022-11-16 MED ORDER — HEPARIN (PORCINE) IN NACL 1000-0.9 UT/500ML-% IV SOLN
INTRAVENOUS | Status: DC | PRN
  Administered 2022-11-16 (×2): 500 mL

## 2022-11-16 MED ORDER — SODIUM CHLORIDE 0.9% FLUSH
3.0000 mL | Freq: Two times a day (BID) | INTRAVENOUS | Status: DC
  Administered 2022-11-16 – 2022-11-17 (×3): 3 mL via INTRAVENOUS

## 2022-11-16 MED ORDER — SODIUM CHLORIDE 0.9% FLUSH: 3.0000 mL | INTRAVENOUS | Status: DC | PRN

## 2022-11-16 SURGICAL SUPPLY — 11 items
CATH 5FR JL3.5 JR4 ANG PIG MP (CATHETERS) IMPLANT
CATH VISTA GUIDE 6FR XBLAD3.5 (CATHETERS) IMPLANT
DEVICE RAD COMP TR BAND LRG (VASCULAR PRODUCTS) IMPLANT
GLIDESHEATH SLEND SS 6F .021 (SHEATH) IMPLANT
GUIDEWIRE INQWIRE 1.5J.035X260 (WIRE) IMPLANT
GUIDEWIRE PRESSURE X 175 (WIRE) IMPLANT
INQWIRE 1.5J .035X260CM (WIRE) ×1
KIT HEART LEFT (KITS) ×1 IMPLANT
PACK CARDIAC CATHETERIZATION (CUSTOM PROCEDURE TRAY) ×1 IMPLANT
TRANSDUCER W/STOPCOCK (MISCELLANEOUS) ×1 IMPLANT
TUBING CIL FLEX 10 FLL-RA (TUBING) ×1 IMPLANT

## 2022-11-16 NOTE — Progress Notes (Signed)
ANTICOAGULATION CONSULT NOTE - Follow Up Consult  Pharmacy Consult for heparin Indication:  [facility]AP  Labs: Recent Labs    11/15/22 1108 11/15/22 1312  HGB 16.3  --   HCT 48.2  --   PLT 192  --   CREATININE 0.89  --   TROPONINIHS 9 36*    Assessment/Plan:  67yo male therapeutic on heparin with initial dosing for ACS. Will continue infusion at current rate of 1250 units/hr and confirm stable with additional level.   Wynona Neat, PharmD, BCPS  11/16/2022,4:05 AM

## 2022-11-16 NOTE — H&P (View-Only) (Signed)
[address]       [place] [address]             [phone]        Christian Davis [place] Medical Record [id number] Date of Birth: 11-Jun-1956  Referring: No ref. provider found Primary Care: Monico Blitz, MD Primary Cardiologist:Vishnu Olene Craven, MD  Chief Complaint:    Chief Complaint  Patient presents with   Chest Pain    History of Present Illness:      Mr. Leibensperger is a 67 year old male with a history of hyperlipidemia, GERD, alcohol abuse, GAD with night terrors and reported pulmonary asbestosis followed by [facility] in [place] with serial CT imaging. Over the past few months he reports intermittent chest pain and pressure with exertion that has been worsening over the past couple of weeks. He states the pain usually starts in his jaw and then his chest. His last chest CT in May showed stable moderate coronary artery calcification. He underwent an outpatient nuclear stress test on 11/15/22 that demonstrated borderline ST abnormalities. After the stress test he felt like someone had punched him in the chest, this lasted a few seconds and then symptoms completely resolved. He states this is the worst chest pain he has ever had. He admits to associated shortness of breath when he has chest pain but denies diaphoresis, dizziness, LOC or nausea and vomiting. He denies symptoms at rest. Upon arrival to the ED, EKG showed nonspecific ST changes and high sensitivity Troponin I elevated from 9 to 36. He was diagnosed with NSTEMI vs unstable angina. Cardiac catheterization on 11/16/22 showed severe 2 vessel CAD with sequential 50-70% ostial through mid LAD stenosis and chronic total occlusion of the dominant left circumflex with left to left and right to left collaterals. It also showed mildly reduced left ventricular systolic function with an LVEF 45-50% and global hypokinesis as well as mild to moderaly elevated left heart filling pressure LVEDP 20-56mmHg,  low dose diuresis was started. Echocardiogram is pending.   He retired 10 years ago, but worked in a [employer] which is where he was exposed to asbestos and states asbestosis is what killed his father. He was walking a couple miles per day but stopped about a year ago, wife states he now has had to stop and rest due to chest discomfort and shortness of breath [place]. He currently denies chest pain or shortness of breath and is resting comfortably in bed. Him and his wife live together in a 1 story house. He does admit to drinking 2-3 drinks daily.  Current Activity/ Functional Status: Patient is independent with mobility/ambulation, transfers, ADL's, IADL's.   Zubrod Score: At the time of surgery this patient's most appropriate activity status/level should be described as: []     0    Normal activity, no symptoms [x]     1    Restricted in physical strenuous activity but ambulatory, able to do out light work []     2    Ambulatory and capable of self care, unable to do work activities, up and about                 more than 50%  Of the time                            []     3    Only limited  self care, in bed greater than 50% of waking hours []     4    Completely disabled, no self care, confined to bed or chair []     5    Moribund  Past Medical History:  Diagnosis Date   Arthritis    Skin Cancer    Coronary artery calcification seen on CT scan    Depression    GERD (gastroesophageal reflux disease)    H/O asbestos exposure    History of kidney stones     Past Surgical History:  Procedure Laterality Date   COLONOSCOPY WITH PROPOFOL N/A 08/23/2022   Procedure: COLONOSCOPY WITH PROPOFOL;  Surgeon: Eloise Harman, DO;  [facility]: [facility];  Service: Endoscopy;  Laterality: N/A;  9:45am, asa 3   FRACTURE SURGERY Left 2002   NECK SURGERY  1990   POLYPECTOMY  08/23/2022   Procedure: POLYPECTOMY;  Surgeon: Eloise Harman, DO;  Location: [facility];   Service: Endoscopy;;    Social History   Tobacco Use  Smoking Status Never  Smokeless Tobacco Never    Social History   Substance and Sexual Activity  Alcohol Use Yes   Alcohol/week: 21.0 standard drinks of alcohol   Types: 21 Cans of beer per week   Comment: 3 beers per day     Allergies  Allergen Reactions   Codeine Anxiety    Current Facility-Administered Medications  Medication Dose Route Frequency Provider Last Rate Last Admin   0.9 %  sodium chloride infusion  250 mL Intravenous PRN End, Harrell Gave, MD       0.9 %  sodium chloride infusion  250 mL Intravenous PRN End, Harrell Gave, MD       acetaminophen (TYLENOL) tablet 650 mg  650 mg Oral Q4H PRN End, Harrell Gave, MD       aspirin EC tablet 81 mg  81 mg Oral Daily End, Christopher, MD       citalopram (CELEXA) tablet 20 mg  20 mg Oral QHS End, Christopher, MD   20 mg at 11/15/22 2053   hydrALAZINE (APRESOLINE) injection 10 mg  10 mg Intravenous Q20 Min PRN End, Harrell Gave, MD       labetalol (NORMODYNE) injection 10 mg  10 mg Intravenous Q10 min PRN End, Harrell Gave, MD       metoprolol succinate (TOPROL-XL) 24 hr tablet 25 mg  25 mg Oral Daily End, Christopher, MD   25 mg at 11/16/22 0918   nitroGLYCERIN (NITROSTAT) SL tablet 0.4 mg  0.4 mg Sublingual Q5 Min x 3 PRN End, Harrell Gave, MD       nitroGLYCERIN 100 mcg/mL intra-arterial injection            ondansetron (ZOFRAN) injection 4 mg  4 mg Intravenous Q6H PRN End, Harrell Gave, MD       pantoprazole (PROTONIX) EC tablet 40 mg  40 mg Oral Daily End, Christopher, MD   40 mg at 11/16/22 0918   pneumococcal 20-valent conjugate vaccine (PREVNAR 20) injection 0.5 mL  0.5 mL Intramuscular Tomorrow-1000 End, Christopher, MD       rosuvastatin (CRESTOR) tablet 40 mg  40 mg Oral Daily End, Christopher, MD   40 mg at 11/16/22 0918   sodium chloride flush (NS) 0.9 % injection 3 mL  3 mL Intravenous Q12H End, Christopher, MD       sodium chloride flush (NS) 0.9 % injection 3  mL  3 mL Intravenous PRN End, Christopher, MD       sodium chloride flush (NS)  0.9 % injection 3 mL  3 mL Intravenous Q12H End, Christopher, MD       sodium chloride flush (NS) 0.9 % injection 3 mL  3 mL Intravenous Q12H End, Christopher, MD       sodium chloride flush (NS) 0.9 % injection 3 mL  3 mL Intravenous PRN End, Christopher, MD        Medications Prior to Admission  Medication Sig Dispense Refill Last Dose   acetaminophen (TYLENOL) 650 MG CR tablet Take 1,300 mg by mouth daily.   11/14/2022   Apoaequorin (PREVAGEN EXTRA STRENGTH) 20 MG CAPS Take 20 mg by mouth daily.   11/14/2022   azelastine (ASTELIN) 0.1 % nasal spray Place 2 sprays into both nostrils at bedtime. Use in each nostril as directed   11/14/2022   cholecalciferol (VITAMIN D3) 25 MCG (1000 UNIT) tablet Take 1,000 Units by mouth daily.   11/14/2022   citalopram (CELEXA) 20 MG tablet Take 20 mg by mouth daily.   11/14/2022   cyanocobalamin (VITAMIN B12) 1000 MCG tablet Take 1,000 mcg by mouth daily.   11/14/2022   gabapentin (NEURONTIN) 100 MG capsule Take 100 mg by mouth at bedtime.   11/14/2022   levocetirizine (XYZAL) 5 MG tablet Take 5 mg by mouth daily.   11/14/2022   meloxicam (MOBIC) 7.5 MG tablet Take 7.5 mg by mouth daily as needed for pain.   Past Month   Menthol, Topical Analgesic, (BLUE-EMU MAXIMUM STRENGTH EX) Apply 1 Application topically daily as needed (pain).   unknown   omeprazole (PRILOSEC) 40 MG capsule Take 40 mg by mouth daily.   11/15/2022   traMADol (ULTRAM) 50 MG tablet Take 50 mg by mouth as needed.   Past Week    Family History  Problem Relation Age of Onset   Alzheimer's disease Mother    Hypertension Father      Review of Systems:   Review of Systems  Constitutional:  Positive for malaise/fatigue. Negative for diaphoresis, fever and weight loss.  HENT:  Positive for congestion. Negative for hearing loss and tinnitus.        Sinusitis due to allergies  Respiratory:  Positive for shortness of  breath. Negative for cough and wheezing.   Cardiovascular:  Positive for chest pain. Negative for palpitations, orthopnea and leg swelling.  Gastrointestinal:  Positive for heartburn. Negative for nausea.  Genitourinary:  Negative for dysuria.       Hx of kidney stones  Musculoskeletal:  Negative for myalgias.       Bilateral elbow pain  Neurological:  Positive for tremors. Negative for dizziness, focal weakness, loss of consciousness and headaches.       Tremor in bilateral hands Numbness and tingling in bilateral toes  Endo/Heme/Allergies:  Bruises/bleeds easily.  Psychiatric/Behavioral:  Negative for depression. The patient is not nervous/anxious.   Dental care every 6 months   Physical Exam: BP 137/74   Pulse 66   Temp 97.6 F (36.4 C) (Oral)   Resp 18   Ht 5\' 11"  (1.803 m)   Wt 113.5 kg   SpO2 95%   BMI 34.90 kg/m   General appearance: alert, cooperative, and no distress Head: Normocephalic, without obvious abnormality, atraumatic Neck: no adenopathy, no carotid bruit, no JVD, supple, symmetrical, trachea midline, and thyroid not enlarged, symmetric, no tenderness/mass/nodules Lymph nodes: Cervical, supraclavicular, and axillary nodes normal. Resp: clear to auscultation bilaterally Cardio: regular rate and rhythm, S1, S2 normal, no murmur, click, rub or gallop GI: soft, non-tender; bowel sounds  normal; no masses,  no organomegaly Extremities: Edema 1+, petechiae on bilateral LE, DP/PT pulses 2+ bilaterally, no varicose veins Neurologic: Alert and oriented X 3, normal strength and tone. Bilateral hand tremor. Sensory intact. No focal weakness.   Diagnostic Studies & Laboratory data: Left Main  Vessel is large.  Mid LM lesion is 30% stenosed. The lesion is eccentric.    Left Anterior Descending  Vessel is large.  Ost LAD lesion is 65% stenosed. Pressure gradient was performed on the lesion using a GUIDEWIRE PRESSURE X 175 and CATH VISTA GUIDE 6FR XBLAD3.5. RFR: 0.77.   Prox LAD lesion is 50% stenosed. Pressure gradient was performed on the lesion using a GUIDEWIRE PRESSURE X 175 and CATH VISTA GUIDE 6FR XBLAD3.5. RFR: 0.77.  Mid LAD lesion is 60% stenosed. Pressure gradient was performed on the lesion using a GUIDEWIRE PRESSURE X 175 and CATH VISTA GUIDE 6FR XBLAD3.5. RFR: 0.77.  Dist LAD lesion is 30% stenosed.    First Diagonal Branch  Vessel is small in size.    Second Diagonal Branch  Vessel is large in size.  2nd Diag lesion is 60% stenosed.    Left Circumflex  Vessel is large.  Ost Cx lesion is 70% stenosed.  Prox Cx lesion is 100% stenosed. The lesion is chronically occluded.    First Obtuse Marginal Branch  Vessel is small in size.    Second Obtuse Marginal Branch  Vessel is large in size.  Collaterals  2nd Mrg filled by collaterals from 2nd Diag.      Lateral Second Obtuse Marginal Branch  Vessel is moderate in size.    Left Posterior Descending Artery  Vessel is moderate in size.    First Left Posterolateral Branch  Vessel is small in size.    Second Left Posterolateral Branch  Vessel is small in size.    Left Posterior Atrioventricular Artery  Collaterals  LPAV filled by collaterals from Dist RCA.      Right Coronary Artery  Vessel is moderate in size. Vessel is angiographically normal.     Dominance: Left   Assessment and Plan: Severe 2 vessel CAD: Heparin drip to be restarted post catheterization. Exercise myoview yesterday showed fairly large region of inferolateral ischemia, small area at the inferoseptal apex. Follow up echocardiogram pending. Would likely benefit from CABG surgery, seems to be a reasonable candidate and patient is willing to undergo surgery. Dr. Roxan Hockey to review and determine candidacy and timing. Possibly Thursday.   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 Serial CT imaging of the chest have not shown worrisome nodules or evidence of malignancy.  HLD:  Continue Rosuvastatin  GAD with night terrors: Continue Celexa  GERD: Continue PPI  Alcohol abuse: 2-3 drinks per day. Decrease alcohol intake  Magdalene River, PA-C  Patient seen and examined, cath images and echo report reviewed. 67 yo man with an 8 month history of chest pain, originally exertional but now sometimes at rest.  Cath showed severe 2 vessel CAD involving LAD and a dominant CTO circumflex.   CABG indicated for survival benefit and relief of symptoms.  I discussed the general nature of the procedure, including the need for general anesthesia, the incisions to be used, the use of cardiopulmonary bypass and drainage tubes and temporary pacemaker wires postoperatively.  We discussed the expected hospital stay, overall recovery and short and long term outcomes.  I informed him of the indications, risks, benefits and alternatives.  He understands the risks include,  but are not limited to death, stroke, MI, DVT/PE, bleeding, possible need for transfusion, infections, cardiac arrhythmias as well as other organ system dysfunction including respiratory, renal, or GI complications.   He accepts the risks and agrees to proceed.   Plan CABG, possible left radial tomorrow AM  Revonda Standard. Roxan Hockey, MD [facility] [phone]

## 2022-11-16 NOTE — Progress Notes (Signed)
Echocardiogram 2D Echocardiogram has been performed.  Christian Davis 11/16/2022, 1:26 PM

## 2022-11-16 NOTE — Consult Note (Cosign Needed)
[address]       [place] [address]             [phone]        Jered P Farha [place] Medical Record [id number] Date of Birth: Oct 06, 1955  Referring: No ref. provider found Primary Care: Monico Blitz, MD Primary Cardiologist:Vishnu Olene Craven, MD  Chief Complaint:    Chief Complaint  Patient presents with   Chest Pain    History of Present Illness:      Mr. Haggan is a 67 year old male with a history of hyperlipidemia, GERD, alcohol abuse, GAD with night terrors and reported pulmonary asbestosis followed by [facility] in [place] with serial CT imaging. Over the past few months he reports intermittent chest pain and pressure with exertion that has been worsening over the past couple of weeks. He states the pain usually starts in his jaw and then his chest. His last chest CT in May showed stable moderate coronary artery calcification. He underwent an outpatient nuclear stress test on 11/15/22 that demonstrated borderline ST abnormalities. After the stress test he felt like someone had punched him in the chest, this lasted a few seconds and then symptoms completely resolved. He states this is the worst chest pain he has ever had. He admits to associated shortness of breath when he has chest pain but denies diaphoresis, dizziness, LOC or nausea and vomiting. He denies symptoms at rest. Upon arrival to the ED, EKG showed nonspecific ST changes and high sensitivity Troponin I elevated from 9 to 36. He was diagnosed with NSTEMI vs unstable angina. Cardiac catheterization on 11/16/22 showed severe 2 vessel CAD with sequential 50-70% ostial through mid LAD stenosis and chronic total occlusion of the dominant left circumflex with left to left and right to left collaterals. It also showed mildly reduced left ventricular systolic function with an LVEF 45-50% and global hypokinesis as well as mild to moderaly elevated left heart filling pressure LVEDP 20-26mmHg,  low dose diuresis was started. Echocardiogram is pending.   He retired 10 years ago, but worked in a [employer] which is where he was exposed to asbestos and states asbestosis is what killed his father. He was walking a couple miles per day but stopped about a year ago, wife states he now has had to stop and rest due to chest discomfort and shortness of breath [place] the airport. He currently denies chest pain or shortness of breath and is resting comfortably in bed. Him and his wife live together in a 1 story house. He does admit to drinking 2-3 drinks daily.  Current Activity/ Functional Status: Patient is independent with mobility/ambulation, transfers, ADL's, IADL's.   Zubrod Score: At the time of surgery this patient's most appropriate activity status/level should be described as: []     0    Normal activity, no symptoms [x]     1    Restricted in physical strenuous activity but ambulatory, able to do out light work []     2    Ambulatory and capable of self care, unable to do work activities, up and about                 more than 50%  Of the time                            []     3    Only limited  self care, in bed greater than 50% of waking hours []     4    Completely disabled, no self care, confined to bed or chair []     5    Moribund  Past Medical History:  Diagnosis Date   Arthritis    Skin Cancer    Coronary artery calcification seen on CT scan    Depression    GERD (gastroesophageal reflux disease)    H/O asbestos exposure    History of kidney stones     Past Surgical History:  Procedure Laterality Date   COLONOSCOPY WITH PROPOFOL N/A 08/23/2022   Procedure: COLONOSCOPY WITH PROPOFOL;  Surgeon: Eloise Harman, DO;  [facility];  Service: Endoscopy;  Laterality: N/A;  9:45am, asa 3   FRACTURE SURGERY Left 2002   NECK SURGERY  1990   POLYPECTOMY  08/23/2022   Procedure: POLYPECTOMY;  Surgeon: Eloise Harman, DO;  Location: [facility];   Service: Endoscopy;;    Social History   Tobacco Use  Smoking Status Never  Smokeless Tobacco Never    Social History   Substance and Sexual Activity  Alcohol Use Yes   Alcohol/week: 21.0 standard drinks of alcohol   Types: 21 Cans of beer per week   Comment: 3 beers per day     Allergies  Allergen Reactions   Codeine Anxiety    Current Facility-Administered Medications  Medication Dose Route Frequency Provider Last Rate Last Admin   0.9 %  sodium chloride infusion  250 mL Intravenous PRN End, Harrell Gave, MD       0.9 %  sodium chloride infusion  250 mL Intravenous PRN End, Harrell Gave, MD       acetaminophen (TYLENOL) tablet 650 mg  650 mg Oral Q4H PRN End, Harrell Gave, MD       aspirin EC tablet 81 mg  81 mg Oral Daily End, Christopher, MD       citalopram (CELEXA) tablet 20 mg  20 mg Oral QHS End, Christopher, MD   20 mg at 11/15/22 2053   hydrALAZINE (APRESOLINE) injection 10 mg  10 mg Intravenous Q20 Min PRN End, Harrell Gave, MD       labetalol (NORMODYNE) injection 10 mg  10 mg Intravenous Q10 min PRN End, Harrell Gave, MD       metoprolol succinate (TOPROL-XL) 24 hr tablet 25 mg  25 mg Oral Daily End, Christopher, MD   25 mg at 11/16/22 0918   nitroGLYCERIN (NITROSTAT) SL tablet 0.4 mg  0.4 mg Sublingual Q5 Min x 3 PRN End, Harrell Gave, MD       nitroGLYCERIN 100 mcg/mL intra-arterial injection            ondansetron (ZOFRAN) injection 4 mg  4 mg Intravenous Q6H PRN End, Harrell Gave, MD       pantoprazole (PROTONIX) EC tablet 40 mg  40 mg Oral Daily End, Christopher, MD   40 mg at 11/16/22 0918   pneumococcal 20-valent conjugate vaccine (PREVNAR 20) injection 0.5 mL  0.5 mL Intramuscular Tomorrow-1000 End, Christopher, MD       rosuvastatin (CRESTOR) tablet 40 mg  40 mg Oral Daily End, Christopher, MD   40 mg at 11/16/22 0918   sodium chloride flush (NS) 0.9 % injection 3 mL  3 mL Intravenous Q12H End, Christopher, MD       sodium chloride flush (NS) 0.9 % injection 3  mL  3 mL Intravenous PRN End, Christopher, MD       sodium chloride flush (NS)  0.9 % injection 3 mL  3 mL Intravenous Q12H End, Christopher, MD       sodium chloride flush (NS) 0.9 % injection 3 mL  3 mL Intravenous Q12H End, Christopher, MD       sodium chloride flush (NS) 0.9 % injection 3 mL  3 mL Intravenous PRN End, Christopher, MD        Medications Prior to Admission  Medication Sig Dispense Refill Last Dose   acetaminophen (TYLENOL) 650 MG CR tablet Take 1,300 mg by mouth daily.   11/14/2022   Apoaequorin (PREVAGEN EXTRA STRENGTH) 20 MG CAPS Take 20 mg by mouth daily.   11/14/2022   azelastine (ASTELIN) 0.1 % nasal spray Place 2 sprays into both nostrils at bedtime. Use in each nostril as directed   11/14/2022   cholecalciferol (VITAMIN D3) 25 MCG (1000 UNIT) tablet Take 1,000 Units by mouth daily.   11/14/2022   citalopram (CELEXA) 20 MG tablet Take 20 mg by mouth daily.   11/14/2022   cyanocobalamin (VITAMIN B12) 1000 MCG tablet Take 1,000 mcg by mouth daily.   11/14/2022   gabapentin (NEURONTIN) 100 MG capsule Take 100 mg by mouth at bedtime.   11/14/2022   levocetirizine (XYZAL) 5 MG tablet Take 5 mg by mouth daily.   11/14/2022   meloxicam (MOBIC) 7.5 MG tablet Take 7.5 mg by mouth daily as needed for pain.   Past Month   Menthol, Topical Analgesic, (BLUE-EMU MAXIMUM STRENGTH EX) Apply 1 Application topically daily as needed (pain).   unknown   omeprazole (PRILOSEC) 40 MG capsule Take 40 mg by mouth daily.   11/15/2022   traMADol (ULTRAM) 50 MG tablet Take 50 mg by mouth as needed.   Past Week    Family History  Problem Relation Age of Onset   Alzheimer's disease Mother    Hypertension Father      Review of Systems:   Review of Systems  Constitutional:  Positive for malaise/fatigue. Negative for diaphoresis, fever and weight loss.  HENT:  Positive for congestion. Negative for hearing loss and tinnitus.        Sinusitis due to allergies  Respiratory:  Positive for shortness of  breath. Negative for cough and wheezing.   Cardiovascular:  Positive for chest pain. Negative for palpitations, orthopnea and leg swelling.  Gastrointestinal:  Positive for heartburn. Negative for nausea.  Genitourinary:  Negative for dysuria.       Hx of kidney stones  Musculoskeletal:  Negative for myalgias.       Bilateral elbow pain  Neurological:  Positive for tremors. Negative for dizziness, focal weakness, loss of consciousness and headaches.       Tremor in bilateral hands Numbness and tingling in bilateral toes  Endo/Heme/Allergies:  Bruises/bleeds easily.  Psychiatric/Behavioral:  Negative for depression. The patient is not nervous/anxious.   Dental care every 6 months   Physical Exam: BP 137/74   Pulse 66   Temp 97.6 F (36.4 C) (Oral)   Resp 18   Ht 5\' 11"  (1.803 m)   Wt 113.5 kg   SpO2 95%   BMI 34.90 kg/m   General appearance: alert, cooperative, and no distress Head: Normocephalic, without obvious abnormality, atraumatic Neck: no adenopathy, no carotid bruit, no JVD, supple, symmetrical, trachea midline, and thyroid not enlarged, symmetric, no tenderness/mass/nodules Lymph nodes: Cervical, supraclavicular, and axillary nodes normal. Resp: clear to auscultation bilaterally Cardio: regular rate and rhythm, S1, S2 normal, no murmur, click, rub or gallop GI: soft, non-tender; bowel sounds  normal; no masses,  no organomegaly Extremities: Edema 1+, petechiae on bilateral LE, DP/PT pulses 2+ bilaterally, no varicose veins Neurologic: Alert and oriented X 3, normal strength and tone. Bilateral hand tremor. Sensory intact. No focal weakness.   Diagnostic Studies & Laboratory data: Left Main  Vessel is large.  Mid LM lesion is 30% stenosed. The lesion is eccentric.    Left Anterior Descending  Vessel is large.  Ost LAD lesion is 65% stenosed. Pressure gradient was performed on the lesion using a GUIDEWIRE PRESSURE X 175 and CATH VISTA GUIDE 6FR XBLAD3.5. RFR: 0.77.   Prox LAD lesion is 50% stenosed. Pressure gradient was performed on the lesion using a GUIDEWIRE PRESSURE X 175 and CATH VISTA GUIDE 6FR XBLAD3.5. RFR: 0.77.  Mid LAD lesion is 60% stenosed. Pressure gradient was performed on the lesion using a GUIDEWIRE PRESSURE X 175 and CATH VISTA GUIDE 6FR XBLAD3.5. RFR: 0.77.  Dist LAD lesion is 30% stenosed.    First Diagonal Branch  Vessel is small in size.    Second Diagonal Branch  Vessel is large in size.  2nd Diag lesion is 60% stenosed.    Left Circumflex  Vessel is large.  Ost Cx lesion is 70% stenosed.  Prox Cx lesion is 100% stenosed. The lesion is chronically occluded.    First Obtuse Marginal Branch  Vessel is small in size.    Second Obtuse Marginal Branch  Vessel is large in size.  Collaterals  2nd Mrg filled by collaterals from 2nd Diag.      Lateral Second Obtuse Marginal Branch  Vessel is moderate in size.    Left Posterior Descending Artery  Vessel is moderate in size.    First Left Posterolateral Branch  Vessel is small in size.    Second Left Posterolateral Branch  Vessel is small in size.    Left Posterior Atrioventricular Artery  Collaterals  LPAV filled by collaterals from Dist RCA.      Right Coronary Artery  Vessel is moderate in size. Vessel is angiographically normal.     Dominance: Left   Assessment and Plan: Severe 2 vessel CAD: Heparin drip to be restarted post catheterization. Exercise myoview yesterday showed fairly large region of inferolateral ischemia, small area at the inferoseptal apex. Follow up echocardiogram pending. Would likely benefit from CABG surgery, seems to be a reasonable candidate and patient is willing to undergo surgery. Dr. Roxan Hockey to review and determine candidacy and timing. Possibly Thursday.   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 Serial CT imaging of the chest have not shown worrisome nodules or evidence of malignancy.  HLD:  Continue Rosuvastatin  GAD with night terrors: Continue Celexa  GERD: Continue PPI  Alcohol abuse: 2-3 drinks per day. Decrease alcohol intake  Magdalene River, PA-C  Patient seen and examined, cath images and echo report reviewed. 67 yo man with an 8 month history of chest pain, originally exertional but now sometimes at rest.  Cath showed severe 2 vessel CAD involving LAD and a dominant CTO circumflex.   CABG indicated for survival benefit and relief of symptoms.  I discussed the general nature of the procedure, including the need for general anesthesia, the incisions to be used, the use of cardiopulmonary bypass and drainage tubes and temporary pacemaker wires postoperatively.  We discussed the expected hospital stay, overall recovery and short and long term outcomes.  I informed him of the indications, risks, benefits and alternatives.  He understands the risks include,  but are not limited to death, stroke, MI, DVT/PE, bleeding, possible need for transfusion, infections, cardiac arrhythmias as well as other organ system dysfunction including respiratory, renal, or GI complications.   He accepts the risks and agrees to proceed.   Plan CABG, possible left radial tomorrow AM  Revonda Standard. Roxan Hockey, MD [facility] [phone]

## 2022-11-16 NOTE — Interval H&P Note (Signed)
History and Physical Interval Note:  11/16/2022 7:42 AM  Bufford Buttner  has presented today for surgery, with the diagnosis of unstable angina and abnormal stress test.  The various methods of treatment have been discussed with the patient and family. After consideration of risks, benefits and other options for treatment, the patient has consented to  Procedure(s): LEFT HEART CATH AND CORONARY ANGIOGRAPHY (N/A) as a surgical intervention.  The patient's history has been reviewed, patient examined, no change in status, stable for surgery.  I have reviewed the patient's chart and labs.  Questions were answered to the patient's satisfaction.    Cath Lab Visit (complete for each Cath Lab visit)  Clinical Evaluation Leading to the Procedure:   ACS: Yes.    Non-ACS:  N/A  Christian Davis

## 2022-11-16 NOTE — Progress Notes (Signed)
Cardiology Progress Note  Patient ID: Christian Davis MRN: [id number] DOB: 12/08/1955 Date of Encounter: 11/16/2022  Primary Cardiologist: Chalmers Guest, MD  Subjective   Chief Complaint: None.   HPI: 2-v CAD not amendable to PCI. Will need CABG evaluation. No CP reported.   ROS:  All other ROS reviewed and negative. Pertinent positives noted in the HPI.     Inpatient Medications  Scheduled Meds:  aspirin EC  81 mg Oral Daily   citalopram  20 mg Oral QHS   metoprolol succinate  25 mg Oral Daily   nitroGLYCERIN       pantoprazole  40 mg Oral Daily   pneumococcal 20-valent conjugate vaccine  0.5 mL Intramuscular Tomorrow-1000   rosuvastatin  40 mg Oral Daily   sodium chloride flush  3 mL Intravenous Q12H   sodium chloride flush  3 mL Intravenous Q12H   sodium chloride flush  3 mL Intravenous Q12H   Continuous Infusions:  sodium chloride     sodium chloride     PRN Meds: sodium chloride, sodium chloride, acetaminophen, hydrALAZINE, labetalol, nitroGLYCERIN, nitroGLYCERIN, ondansetron (ZOFRAN) IV, sodium chloride flush, sodium chloride flush   Vital Signs   Vitals:   11/15/22 2042 11/16/22 0013 11/16/22 0423 11/16/22 0748  BP: (!) 149/88 106/86 (!) 156/84   Pulse: 75 71 63   Resp: 18 18 18    Temp: (!) 97.5 F (36.4 C) 97.7 F (36.5 C) 97.6 F (36.4 C)   TempSrc: Oral Oral Oral   SpO2: 98% 97% 98% 100%  Weight:  113.5 kg    Height:        Intake/Output Summary (Last 24 hours) at 11/16/2022 0932 Last data filed at 11/15/2022 2200 Gross per 24 hour  Intake --  Output 200 ml  Net -200 ml      11/16/2022   12:13 AM 11/15/2022    4:56 PM 11/15/2022   10:54 AM  Last 3 Weights  Weight (lbs) 250 lb 3.2 oz 260 lb 266 lb 3.2 oz  Weight (kg) 113.49 kg 117.935 kg 120.748 kg      Telemetry  Overnight telemetry shows sinus rhythm 60s, which I personally reviewed.   ECG  The most recent ECG shows sinus rhythm heart rate 68, inferior T wave inversions, which I  personally reviewed.   Physical Exam   Vitals:   11/15/22 2042 11/16/22 0013 11/16/22 0423 11/16/22 0748  BP: (!) 149/88 106/86 (!) 156/84   Pulse: 75 71 63   Resp: 18 18 18    Temp: (!) 97.5 F (36.4 C) 97.7 F (36.5 C) 97.6 F (36.4 C)   TempSrc: Oral Oral Oral   SpO2: 98% 97% 98% 100%  Weight:  113.5 kg    Height:        Intake/Output Summary (Last 24 hours) at 11/16/2022 0932 Last data filed at 11/15/2022 2200 Gross per 24 hour  Intake --  Output 200 ml  Net -200 ml       11/16/2022   12:13 AM 11/15/2022    4:56 PM 11/15/2022   10:54 AM  Last 3 Weights  Weight (lbs) 250 lb 3.2 oz 260 lb 266 lb 3.2 oz  Weight (kg) 113.49 kg 117.935 kg 120.748 kg    Body mass index is 34.9 kg/m.  General: Well nourished, well developed, in no acute distress Head: Atraumatic, normal size  Eyes: PEERLA, EOMI  Neck: Supple, no JVD Endocrine: No thryomegaly Cardiac: Normal S1, S2; RRR; no murmurs, rubs, or gallops Lungs: Clear  to auscultation bilaterally, no wheezing, rhonchi or rales  Abd: Soft, nontender, no hepatomegaly  Ext: No edema, pulses 2+ Musculoskeletal: No deformities, BUE and BLE strength normal and equal Skin: Warm and dry, no rashes   Neuro: Alert and oriented to person, place, time, and situation, CNII-XII grossly intact, no focal deficits  Psych: Normal mood and affect   Labs  High Sensitivity Troponin:   Recent Labs  Lab 11/15/22 1108 11/15/22 1312  TROPONINIHS 9 36*     Cardiac EnzymesNo results for input(s): "TROPONINI" in the last 168 hours. No results for input(s): "TROPIPOC" in the last 168 hours.  Chemistry Recent Labs  Lab 11/15/22 1108 11/16/22 0220  NA 135 135  K 3.9 4.2  CL 99 101  CO2 26 25  GLUCOSE 125* 104*  BUN 14 12  CREATININE 0.89 0.87  CALCIUM 8.9 9.0  GFRNONAA >60 >60  ANIONGAP 10 9    Hematology Recent Labs  Lab 11/15/22 1108 11/16/22 0220 11/16/22 0559  WBC 6.8 9.7 8.5  RBC 5.08 4.75 4.60  HGB 16.3 14.9 14.9  HCT 48.2 44.9  43.2  MCV 94.9 94.5 93.9  MCH 32.1 31.4 32.4  MCHC 33.8 33.2 34.5  RDW 12.6 12.5 12.5  PLT 192 174 168   BNPNo results for input(s): "BNP", "PROBNP" in the last 168 hours.  DDimer No results for input(s): "DDIMER" in the last 168 hours.   Radiology  CARDIAC CATHETERIZATION  Result Date: 11/16/2022 Conclusions: Severe two-vessel coronary artery disease with sequential 50-70% ostial through mid LAD stenoses that are hemodynamically significant by RFR and chronic total occlusion of dominant LCx with left-to-left and right-to-left collaterals. Mildly reduced left ventricular systolic function (LVEF [id number]) with global hypokinesis. Mild to moderately elevated left heart filling pressure (LVEDP 20-25 mmHg). Recommendations: Cardiac surgery consultation for CABG. Aggressive secondary prevention of coronary artery disease. Gentle diuresis; furosemide 20 mg IV x 1 has been ordered. Follow-up echocardiogram. Nelva Bush, MD [facility]  DG Chest Port 1 View  Result Date: 11/15/2022 CLINICAL DATA:  Chest pain EXAM: PORTABLE CHEST 1 VIEW COMPARISON:  12/04/2008 FINDINGS: The heart size and mediastinal contours are within normal limits. Slightly low lung volumes. No focal airspace consolidation, pleural effusion, or pneumothorax. The visualized skeletal structures are unremarkable. IMPRESSION: No active disease. Electronically Signed   By: Davina Poke D.O.   On: 11/15/2022 11:31   NM Myocar Multi W/Spect W/Wall Motion / EF  Result Date: 11/15/2022   Stress ECG is borderline positive for ischemia due to findings of 1.0 mm of horizontal ST depression noted in lead II with stress that persisted into recovery.   Patient had substernal chest tightness that occurred in stress, continued into recovery and after nuclear scanning. Chest pain resolved but patient sent to ER due to prolonged episode of chest tightness.   There is a medium sized reversible perfusion defect present in the apical to mid  anterolateral and inferolateral location consistent with ischemia.There is another medium sized reversible defect present in the apical to basal inferior location consistent with ischemia.   Left ventricular function is abnormal. Nuclear stress EF: 51 %. Consider 2D Echocardiogram for accurate estimation of LVEF.   Findings are consistent with ischemia. The study is high risk.    Cardiac Studies  [facility] 11/16/2022 Conclusions: Severe two-vessel coronary artery disease with sequential 50-70% ostial through mid LAD stenoses that are hemodynamically significant by RFR and chronic total occlusion of dominant LCx with left-to-left and right-to-left collaterals. Mildly reduced left ventricular systolic  function (LVEF 45-50%) with global hypokinesis. Mild to moderately elevated left heart filling pressure (LVEDP 20-25 mmHg).   Recommendations: Cardiac surgery consultation for CABG. Aggressive secondary prevention of coronary artery disease. Gentle diuresis; furosemide 20 mg IV x 1 has been ordered. Follow-up echocardiogram.  Patient Profile  Christian Davis is a 67 y.o. male with asbestosis who was admitted on 11/15/2022 for non-STEMI.  Found to have two-vessel CAD not amendable to PCI.  Assessment & Plan   # Unstable Angina -Admitted with worsening chest pain symptoms.  Abnormal stress test.  Troponins minimally elevated.  Left heart catheterization this morning showed ostial LAD lesions as well as a CTO of a dominant left circumflex with collaterals. -LVEDP elevated.  Given a one-time dose of Lasix.  Does not appear volume up. -Not amendable to PCI.  Will need CABG evaluation. -Follow-up echo.  I have ordered CABG Dopplers. -Currently on aspirin and high intensity statin.  We have added a beta-blocker. -TSH normal.  All other lab work normal. -restart heparin tonight after TR band off.   # Hyperlipidemia -High intensity statin  # Elevated LVEDP -20 mg Lasix. -Follow-up echo.  Further titration  of medications pending echo.  # Memory impairment -Wife concerned about short-term memory issues.  Alert and oriented x 4.  We discussed he may have some benign memory issues but he seems appropriate for me.  Nothing precludes him from CABG evaluation.  FEN -code: full -diet: heart healthy -dvt ppx: heparin drip  -no IVF  For questions or updates, please contact [place] Please consult [email or web address] for contact info under        Signed, [place] B[place] T. Audie Box, MD, [place]  11/16/2022 9:32 AM

## 2022-11-16 NOTE — Progress Notes (Signed)
   11/16/22 1338  Spiritual Encounters  Type of Visit Initial  Care provided to: Patient  Conversation partners present during encounter Nurse  Referral source Patient request  Reason for visit Routine spiritual support  OnCall Visit No  Spiritual Framework  Presenting Themes Rituals and practive  Values/beliefs [employer]  Interventions  Spiritual Care Interventions Made Prayer   Visited patient requesting prayer. Nurse and aide where present. Educator was present as well. Prayed with patient. Will return for follow up.

## 2022-11-16 NOTE — Progress Notes (Signed)
ANTICOAGULATION CONSULT NOTE - Initial Consult  Pharmacy Consult for heparin Indication: chest pain/ACS  Allergies  Allergen Reactions   Codeine Anxiety    Patient Measurements: Height: 5\' 11"  (180.3 cm) Weight: 113.5 kg (250 lb 3.2 oz) IBW/kg (Calculated) : 75.3 Heparin Dosing Weight: 102 kg  Vital Signs: Temp: 97.8 F (36.6 C) (04/02 1959) Temp Source: Oral (04/02 1959) BP: 138/76 (04/02 1959) Pulse Rate: 67 (04/02 1959)  Labs: Recent Labs    11/15/22 1108 11/15/22 1312 11/16/22 0220 11/16/22 0559 11/16/22 1939  HGB 16.3  --  14.9 14.9  --   HCT 48.2  --  44.9 43.2  --   PLT 192  --  174 168  --   HEPARINUNFRC  --   --  0.57 0.51 0.35  CREATININE 0.89  --  0.87  --   --   TROPONINIHS 9 36*  --   --   --      Estimated Creatinine Clearance: 108.5 mL/min (by C-G formula based on SCr of 0.87 mg/dL).   Medical History: Past Medical History:  Diagnosis Date   Arthritis    Cancer    Coronary artery calcification seen on CT scan    Depression    GERD (gastroesophageal reflux disease)    H/O asbestos exposure    History of kidney stones     Medications:  Medications Prior to Admission  Medication Sig Dispense Refill Last Dose   acetaminophen (TYLENOL) 650 MG CR tablet Take 1,300 mg by mouth daily.   11/14/2022   Apoaequorin (PREVAGEN EXTRA STRENGTH) 20 MG CAPS Take 20 mg by mouth daily.   11/14/2022   azelastine (ASTELIN) 0.1 % nasal spray Place 2 sprays into both nostrils at bedtime. Use in each nostril as directed   11/14/2022   cholecalciferol (VITAMIN D3) 25 MCG (1000 UNIT) tablet Take 1,000 Units by mouth daily.   11/14/2022   citalopram (CELEXA) 20 MG tablet Take 20 mg by mouth daily.   11/14/2022   cyanocobalamin (VITAMIN B12) 1000 MCG tablet Take 1,000 mcg by mouth daily.   11/14/2022   gabapentin (NEURONTIN) 100 MG capsule Take 100 mg by mouth at bedtime.   11/14/2022   levocetirizine (XYZAL) 5 MG tablet Take 5 mg by mouth daily.   11/14/2022   meloxicam  (MOBIC) 7.5 MG tablet Take 7.5 mg by mouth daily as needed for pain.   Past Month   Menthol, Topical Analgesic, (BLUE-EMU MAXIMUM STRENGTH EX) Apply 1 Application topically daily as needed (pain).   unknown   omeprazole (PRILOSEC) 40 MG capsule Take 40 mg by mouth daily.   11/15/2022   traMADol (ULTRAM) 50 MG tablet Take 50 mg by mouth as needed.   Past Week    Assessment: Pharmacy consulted to dose heparin in patient with chest pain/ACS.  Patient is not on anticoagulation prior to admission.  Pt is s/p cath today with multivessels CAD. Will need CABG consult. Heparin level came back therapeutic tonight. We will cont with the current rate and check in AM.   Goal of Therapy:  Heparin level 0.3-0.7 units/ml Monitor platelets by anticoagulation protocol: Yes   Plan:  Cont heparin 1250 units/hr Daily heparin level and CBC  Onnie Boer, PharmD, BCIDP, AAHIVP, CPP Infectious Disease Pharmacist 11/16/2022 8:56 PM

## 2022-11-17 ENCOUNTER — Inpatient Hospital Stay (HOSPITAL_COMMUNITY): Payer: Medicare Other

## 2022-11-17 DIAGNOSIS — I150 Renovascular hypertension: Secondary | ICD-10-CM | POA: Diagnosis not present

## 2022-11-17 DIAGNOSIS — E7849 Other hyperlipidemia: Secondary | ICD-10-CM | POA: Diagnosis not present

## 2022-11-17 DIAGNOSIS — I214 Non-ST elevation (NSTEMI) myocardial infarction: Secondary | ICD-10-CM | POA: Diagnosis not present

## 2022-11-17 DIAGNOSIS — Z01810 Encounter for preprocedural cardiovascular examination: Secondary | ICD-10-CM

## 2022-11-17 LAB — HEMOGLOBIN A1C
Hgb A1c MFr Bld: 5.9 % — ABNORMAL HIGH (ref 4.8–5.6)
Mean Plasma Glucose: 123 mg/dL

## 2022-11-17 LAB — BASIC METABOLIC PANEL
Anion gap: 10 (ref 5–15)
BUN: 14 mg/dL (ref 8–23)
CO2: 22 mmol/L (ref 22–32)
Calcium: 8.9 mg/dL (ref 8.9–10.3)
Chloride: 101 mmol/L (ref 98–111)
Creatinine, Ser: 0.8 mg/dL (ref 0.61–1.24)
GFR, Estimated: >60 mL/min (ref >60)
Glucose, Bld: 105 mg/dL — ABNORMAL HIGH (ref 70–99)
Potassium: 4 mmol/L (ref 3.5–5.1)
Sodium: 133 mmol/L — ABNORMAL LOW (ref 135–145)

## 2022-11-17 LAB — CBC
HCT: 43.5 % (ref 39.0–52.0)
Hemoglobin: 15.1 g/dL (ref 13.0–17.0)
MCH: 32.3 pg (ref 26.0–34.0)
MCHC: 34.7 g/dL (ref 30.0–36.0)
MCV: 92.9 fL (ref 80.0–100.0)
Platelets: 171 10*3/uL (ref 150–400)
RBC: 4.68 MIL/uL (ref 4.22–5.81)
RDW: 12.5 % (ref 11.5–15.5)
WBC: 9.7 10*3/uL (ref 4.0–10.5)
nRBC: 0 % (ref 0.0–0.2)

## 2022-11-17 LAB — SARS CORONAVIRUS 2 BY RT PCR: SARS Coronavirus 2 by RT PCR: NEGATIVE

## 2022-11-17 LAB — LIPOPROTEIN A (LPA): Lipoprotein (a): 8.7 nmol/L (ref <75.0)

## 2022-11-17 LAB — TYPE AND SCREEN
ABO/RH(D): A POS
Antibody Screen: NEGATIVE

## 2022-11-17 LAB — HEPARIN LEVEL (UNFRACTIONATED): Heparin Unfractionated: 0.53 IU/mL (ref 0.30–0.70)

## 2022-11-17 LAB — ABO/RH: ABO/RH(D): A POS

## 2022-11-17 MED ORDER — CHLORHEXIDINE GLUCONATE CLOTH 2 % EX PADS
6.0000 | MEDICATED_PAD | Freq: Once | CUTANEOUS | Status: AC
  Administered 2022-11-18: 6 via TOPICAL

## 2022-11-17 MED ORDER — MILRINONE LACTATE IN DEXTROSE 20-5 MG/100ML-% IV SOLN
0.3000 ug/kg/min | INTRAVENOUS | Status: DC
  Filled 2022-11-17: qty 100

## 2022-11-17 MED ORDER — CHLORHEXIDINE GLUCONATE CLOTH 2 % EX PADS
6.0000 | MEDICATED_PAD | Freq: Once | CUTANEOUS | Status: AC
  Administered 2022-11-17: 6 via TOPICAL

## 2022-11-17 MED ORDER — EPINEPHRINE HCL 5 MG/250ML IV SOLN IN NS
0.0000 ug/min | INTRAVENOUS | Status: DC
  Filled 2022-11-17: qty 250

## 2022-11-17 MED ORDER — PHENYLEPHRINE HCL-NACL 20-0.9 MG/250ML-% IV SOLN
30.0000 ug/min | INTRAVENOUS | Status: AC
  Administered 2022-11-18: 20 ug/min via INTRAVENOUS
  Filled 2022-11-17: qty 250

## 2022-11-17 MED ORDER — METOPROLOL TARTRATE 12.5 MG HALF TABLET
12.5000 mg | ORAL_TABLET | Freq: Once | ORAL | Status: AC
  Administered 2022-11-18: 12.5 mg via ORAL
  Filled 2022-11-17: qty 1

## 2022-11-17 MED ORDER — DIAZEPAM 5 MG PO TABS
5.0000 mg | ORAL_TABLET | Freq: Once | ORAL | Status: AC
  Administered 2022-11-18: 5 mg via ORAL
  Filled 2022-11-17: qty 1

## 2022-11-17 MED ORDER — SPIRONOLACTONE 25 MG PO TABS
25.0000 mg | ORAL_TABLET | Freq: Every day | ORAL | Status: DC
  Administered 2022-11-17: 25 mg via ORAL
  Filled 2022-11-17: qty 1

## 2022-11-17 MED ORDER — CHLORHEXIDINE GLUCONATE 0.12 % MT SOLN
15.0000 mL | Freq: Once | OROMUCOSAL | Status: AC
  Administered 2022-11-18: 15 mL via OROMUCOSAL
  Filled 2022-11-17: qty 15

## 2022-11-17 MED ORDER — LOSARTAN POTASSIUM 25 MG PO TABS: 25.0000 mg | ORAL_TABLET | Freq: Every day | ORAL | Status: DC

## 2022-11-17 MED ORDER — VANCOMYCIN HCL 1500 MG/300ML IV SOLN
1500.0000 mg | INTRAVENOUS | Status: AC
  Administered 2022-11-18: 1500 mg via INTRAVENOUS
  Filled 2022-11-17: qty 300

## 2022-11-17 MED ORDER — CEFAZOLIN SODIUM-DEXTROSE 2-4 GM/100ML-% IV SOLN
2.0000 g | INTRAVENOUS | Status: AC
  Administered 2022-11-18: 2 g via INTRAVENOUS
  Filled 2022-11-17: qty 100

## 2022-11-17 MED ORDER — NITROGLYCERIN IN D5W 200-5 MCG/ML-% IV SOLN
2.0000 ug/min | INTRAVENOUS | Status: AC
  Administered 2022-11-18: 10 ug/min via INTRAVENOUS
  Filled 2022-11-17: qty 250

## 2022-11-17 MED ORDER — MAGNESIUM SULFATE 50 % IJ SOLN
40.0000 meq | INTRAMUSCULAR | Status: DC
  Filled 2022-11-17: qty 9.85

## 2022-11-17 MED ORDER — PLASMA-LYTE A IV SOLN
INTRAVENOUS | Status: DC
  Filled 2022-11-17: qty 2.5

## 2022-11-17 MED ORDER — TRANEXAMIC ACID 1000 MG/10ML IV SOLN
1.5000 mg/kg/h | INTRAVENOUS | Status: AC
  Administered 2022-11-18: 1.5 mg/kg/h via INTRAVENOUS
  Filled 2022-11-17: qty 25

## 2022-11-17 MED ORDER — HEPARIN 30,000 UNITS/1000 ML (OHS) CELLSAVER SOLUTION
Status: DC
  Filled 2022-11-17: qty 1000

## 2022-11-17 MED ORDER — POTASSIUM CHLORIDE 2 MEQ/ML IV SOLN
80.0000 meq | INTRAVENOUS | Status: DC
  Filled 2022-11-17: qty 40

## 2022-11-17 MED ORDER — NOREPINEPHRINE 4 MG/250ML-% IV SOLN
0.0000 ug/min | INTRAVENOUS | Status: DC
  Filled 2022-11-17: qty 250

## 2022-11-17 MED ORDER — TRANEXAMIC ACID (OHS) PUMP PRIME SOLUTION
2.0000 mg/kg | INTRAVENOUS | Status: DC
  Filled 2022-11-17: qty 2.27

## 2022-11-17 MED ORDER — BISACODYL 5 MG PO TBEC: 5.0000 mg | DELAYED_RELEASE_TABLET | Freq: Once | ORAL | Status: DC

## 2022-11-17 MED ORDER — DEXMEDETOMIDINE HCL IN NACL 400 MCG/100ML IV SOLN
0.1000 ug/kg/h | INTRAVENOUS | Status: AC
  Administered 2022-11-18: .5 ug/kg/h via INTRAVENOUS
  Filled 2022-11-17: qty 100

## 2022-11-17 MED ORDER — INSULIN REGULAR(HUMAN) IN NACL 100-0.9 UT/100ML-% IV SOLN
INTRAVENOUS | Status: AC
  Administered 2022-11-18: 1 [IU]/h via INTRAVENOUS
  Filled 2022-11-17 (×2): qty 100

## 2022-11-17 MED ORDER — ALPRAZOLAM 0.25 MG PO TABS
0.2500 mg | ORAL_TABLET | Freq: Every day | ORAL | Status: DC | PRN
  Administered 2022-11-17: 0.25 mg via ORAL
  Filled 2022-11-17: qty 1

## 2022-11-17 MED ORDER — TRANEXAMIC ACID (OHS) BOLUS VIA INFUSION
15.0000 mg/kg | INTRAVENOUS | Status: AC
  Administered 2022-11-18: 1702.5 mg via INTRAVENOUS
  Filled 2022-11-17: qty 1703

## 2022-11-17 NOTE — TOC Progression Note (Signed)
Transition of Care [facility]) - Progression Note    Patient Details  Name: Christian Davis MRN: [id number] Date of Birth: [birth year]  Transition of Care [facility]) CM/SW Contact  Zenon Mayo, RN Phone Number: 11/17/2022, 4:43 PM  Clinical Narrative:    from home , chest pain, conts on hep drip, w/up for CABG tomorrow.   TOC following.        Expected Discharge Plan and Services                                               Social Determinants of Health (SDOH) Interventions SDOH Screenings   Food Insecurity: No Food Insecurity (11/15/2022)  Housing: Low Risk  (11/15/2022)  Transportation Needs: No Transportation Needs (11/15/2022)  Utilities: Not At Risk (11/15/2022)  Depression (PHQ2-9): Low Risk  (02/08/2020)  Tobacco Use: Low Risk  (11/16/2022)    Readmission Risk Interventions     No data to display

## 2022-11-17 NOTE — Progress Notes (Signed)
Cardiology Progress Note  Patient ID: Christian Davis MRN: [id number] DOB: 07-23-1956 Date of Encounter: 11/17/2022  Primary Cardiologist: Chalmers Guest, MD  Subjective   Chief Complaint: None.   HPI: Reports no chest pain or trouble breathing.  Right radial cath site 2+ pulse.  Awaiting evaluation by cardiac surgery.  ROS:  All other ROS reviewed and negative. Pertinent positives noted in the HPI.     Inpatient Medications  Scheduled Meds:  aspirin EC  81 mg Oral Daily   citalopram  20 mg Oral QHS   metoprolol succinate  25 mg Oral Daily   pantoprazole  40 mg Oral Daily   pneumococcal 20-valent conjugate vaccine  0.5 mL Intramuscular Tomorrow-1000   rosuvastatin  40 mg Oral Daily   sodium chloride flush  3 mL Intravenous Q12H   sodium chloride flush  3 mL Intravenous Q12H   sodium chloride flush  3 mL Intravenous Q12H   Continuous Infusions:  sodium chloride     sodium chloride     heparin 1,250 Units/hr (11/16/22 1823)   PRN Meds: sodium chloride, sodium chloride, acetaminophen, nitroGLYCERIN, ondansetron (ZOFRAN) IV, sodium chloride flush, sodium chloride flush   Vital Signs   Vitals:   11/16/22 1511 11/16/22 1959 11/17/22 0052 11/17/22 0429  BP: 115/83 138/76 (!) 141/79 (!) 141/85  Pulse: 75 67 67 69  Resp: 20 (!) 22 19 19   Temp: 97.8 F (36.6 C) 97.8 F (36.6 C) 97.8 F (36.6 C) 98 F (36.7 C)  TempSrc: Oral Oral Oral Oral  SpO2: 96% 96% 98% 95%  Weight:   113.5 kg   Height:        Intake/Output Summary (Last 24 hours) at 11/17/2022 0942 Last data filed at 11/16/2022 1823 Gross per 24 hour  Intake 1008.76 ml  Output --  Net 1008.76 ml      11/17/2022   12:52 AM 11/16/2022   12:13 AM 11/15/2022    4:56 PM  Last 3 Weights  Weight (lbs) 250 lb 3.6 oz 250 lb 3.2 oz 260 lb  Weight (kg) 113.5 kg 113.49 kg 117.935 kg      Telemetry  Overnight telemetry shows sinus rhythm 80s, PVCs noted, which I personally reviewed.    Physical Exam   Vitals:    11/16/22 1511 11/16/22 1959 11/17/22 0052 11/17/22 0429  BP: 115/83 138/76 (!) 141/79 (!) 141/85  Pulse: 75 67 67 69  Resp: 20 (!) 22 19 19   Temp: 97.8 F (36.6 C) 97.8 F (36.6 C) 97.8 F (36.6 C) 98 F (36.7 C)  TempSrc: Oral Oral Oral Oral  SpO2: 96% 96% 98% 95%  Weight:   113.5 kg   Height:        Intake/Output Summary (Last 24 hours) at 11/17/2022 0942 Last data filed at 11/16/2022 1823 Gross per 24 hour  Intake 1008.76 ml  Output --  Net 1008.76 ml       11/17/2022   12:52 AM 11/16/2022   12:13 AM 11/15/2022    4:56 PM  Last 3 Weights  Weight (lbs) 250 lb 3.6 oz 250 lb 3.2 oz 260 lb  Weight (kg) 113.5 kg 113.49 kg 117.935 kg    Body mass index is 34.9 kg/m.  General: Well nourished, well developed, in no acute distress Head: Atraumatic, normal size  Eyes: PEERLA, EOMI  Neck: Supple, no JVD Endocrine: No thryomegaly Cardiac: Normal S1, S2; RRR; no murmurs, rubs, or gallops Lungs: Clear to auscultation bilaterally, no wheezing, rhonchi or rales  Abd:  Soft, nontender, no hepatomegaly  Ext: No edema, pulses 2+ 2+ radial pulse Musculoskeletal: No deformities, BUE and BLE strength normal and equal Skin: Warm and dry, no rashes   Neuro: Alert and oriented to person, place, time, and situation, CNII-XII grossly intact, no focal deficits  Psych: Normal mood and affect   Labs  High Sensitivity Troponin:   Recent Labs  Lab 11/15/22 1108 11/15/22 1312  TROPONINIHS 9 36*     Cardiac EnzymesNo results for input(s): "TROPONINI" in the last 168 hours. No results for input(s): "TROPIPOC" in the last 168 hours.  Chemistry Recent Labs  Lab 11/15/22 1108 11/16/22 0220 11/17/22 0029  NA 135 135 133*  K 3.9 4.2 4.0  CL 99 101 101  CO2 26 25 22   GLUCOSE 125* 104* 105*  BUN 14 12 14   CREATININE 0.89 0.87 0.80  CALCIUM 8.9 9.0 8.9  GFRNONAA >60 >60 >60  ANIONGAP 10 9 10     Hematology Recent Labs  Lab 11/16/22 0220 11/16/22 0559 11/17/22 0029  WBC 9.7 8.5 9.7  RBC 4.75  4.60 4.68  HGB 14.9 14.9 15.1  HCT 44.9 43.2 43.5  MCV 94.5 93.9 92.9  MCH 31.4 32.4 32.3  MCHC 33.2 34.5 34.7  RDW 12.5 12.5 12.5  PLT 174 168 171   BNPNo results for input(s): "BNP", "PROBNP" in the last 168 hours.  DDimer No results for input(s): "DDIMER" in the last 168 hours.   Radiology  ECHOCARDIOGRAM COMPLETE  Result Date: 11/16/2022    ECHOCARDIOGRAM REPORT   Patient Name:   Christian Davis Date of Exam: 11/16/2022 Medical Rec #:  [id number]      Height:       71.0 in Accession #:    [id number]     Weight:       250.2 lb Date of Birth:  1955-10-26      BSA:          2.319 m Patient Age:    67 years       BP:           156/84 mmHg Patient Gender: M              HR:           70 bpm. Exam Location:  Inpatient Procedure: 2D Echo, Cardiac Doppler, Color Doppler and Intracardiac            Opacification Agent Indications:    Chest Pain R07.9  History:        Patient has no prior history of Echocardiogram examinations.                 Signs/Symptoms:Chest Pain and Dyspnea.  Sonographer:    Ronny Flurry Referring Phys: [id number] [place]  1. Left ventricular ejection fraction, by estimation, is 60 to 65%. The left ventricle has normal function. Left ventricular endocardial border not optimally defined to evaluate regional wall motion. Left ventricular diastolic parameters were normal.  2. Right ventricular systolic function is normal. The right ventricular size is normal.  3. The mitral valve was not well visualized. No evidence of mitral valve regurgitation. No evidence of mitral stenosis.  4. The aortic valve is tricuspid. Aortic valve regurgitation is not visualized. No aortic stenosis is present. Comparison(s): No prior Echocardiogram. FINDINGS  Left Ventricle: Left ventricular ejection fraction, by estimation, is 60 to 65%. The left ventricle has normal function. Left ventricular endocardial border not optimally defined to evaluate regional wall motion. Definity contrast agent  was given IV to delineate the left ventricular endocardial borders. The left ventricular internal cavity size was normal in size. Suboptimal image quality limits for assessment of left ventricular hypertrophy. Left ventricular diastolic parameters were normal. Right Ventricle: The right ventricular size is normal. No increase in right ventricular wall thickness. Right ventricular systolic function is normal. Left Atrium: Left atrial size was normal in size. Right Atrium: Right atrial size was normal in size. Pericardium: There is no evidence of pericardial effusion. Mitral Valve: The mitral valve was not well visualized. No evidence of mitral valve regurgitation. No evidence of mitral valve stenosis. Tricuspid Valve: The tricuspid valve is normal in structure. Tricuspid valve regurgitation is not demonstrated. No evidence of tricuspid stenosis. Aortic Valve: The aortic valve is tricuspid. Aortic valve regurgitation is not visualized. No aortic stenosis is present. Aortic valve mean gradient measures 3.0 mmHg. Aortic valve peak gradient measures 5.9 mmHg. Aortic valve area, by VTI measures 3.07 cm. Pulmonic Valve: The pulmonic valve was normal in structure. Pulmonic valve regurgitation is not visualized. No evidence of pulmonic stenosis. Aorta: The aortic root and ascending aorta are structurally normal, with no evidence of dilitation. IAS/Shunts: No atrial level shunt detected by color flow Doppler.  LEFT VENTRICLE PLAX 2D LVOT diam:     2.10 cm     Diastology LV SV:         72          LV e' medial:    10.40 cm/s LV SV Index:   31          LV E/e' medial:  8.8 LVOT Area:     3.46 cm    LV e' lateral:   11.60 cm/s                            LV E/e' lateral: 7.9  LV Volumes (MOD) LV vol d, MOD A4C: 99.1 ml LV vol s, MOD A4C: 35.9 ml LV SV MOD A4C:     99.1 ml RIGHT VENTRICLE RV S prime:     11.70 cm/s TAPSE (M-mode): 1.7 cm AORTIC VALVE AV Area (Vmax):    3.07 cm AV Area (Vmean):   2.96 cm AV Area (VTI):     3.07  cm AV Vmax:           121.00 cm/s AV Vmean:          82.100 cm/s AV VTI:            0.236 m AV Peak Grad:      5.9 mmHg AV Mean Grad:      3.0 mmHg LVOT Vmax:         107.33 cm/s LVOT Vmean:        70.200 cm/s LVOT VTI:          0.209 m LVOT/AV VTI ratio: 0.89  AORTA Ao Root diam: 3.50 cm Ao Asc diam:  2.70 cm MITRAL VALVE MV Area (PHT): 3.72 cm    SHUNTS MV Decel Time: 204 msec    Systemic VTI:  0.21 m MV E velocity: 91.90 cm/s  Systemic Diam: 2.10 cm MV A velocity: 92.40 cm/s MV E/A ratio:  0.99 Rudean Haskell MD Electronically signed by Rudean Haskell MD Signature Date/Time: 11/16/2022/4:42:45 PM    Final    CARDIAC CATHETERIZATION  Result Date: 11/16/2022 Conclusions: Severe two-vessel coronary artery disease with sequential 50-70% ostial through mid LAD stenoses that are hemodynamically significant by RFR and  chronic total occlusion of dominant LCx with left-to-left and right-to-left collaterals. Mildly reduced left ventricular systolic function (LVEF [id number]) with global hypokinesis. Mild to moderately elevated left heart filling pressure (LVEDP 20-25 mmHg). Recommendations: Cardiac surgery consultation for CABG. Aggressive secondary prevention of coronary artery disease. Gentle diuresis; furosemide 20 mg IV x 1 has been ordered. Follow-up echocardiogram. Nelva Bush, MD [facility]  DG Chest Port 1 View  Result Date: 11/15/2022 CLINICAL DATA:  Chest pain EXAM: PORTABLE CHEST 1 VIEW COMPARISON:  12/04/2008 FINDINGS: The heart size and mediastinal contours are within normal limits. Slightly low lung volumes. No focal airspace consolidation, pleural effusion, or pneumothorax. The visualized skeletal structures are unremarkable. IMPRESSION: No active disease. Electronically Signed   By: Davina Poke D.O.   On: 11/15/2022 11:31   NM Myocar Multi W/Spect W/Wall Motion / EF  Result Date: 11/15/2022   Stress ECG is borderline positive for ischemia due to findings of 1.0 mm of horizontal  ST depression noted in lead II with stress that persisted into recovery.   Patient had substernal chest tightness that occurred in stress, continued into recovery and after nuclear scanning. Chest pain resolved but patient sent to ER due to prolonged episode of chest tightness.   There is a medium sized reversible perfusion defect present in the apical to mid anterolateral and inferolateral location consistent with ischemia.There is another medium sized reversible defect present in the apical to basal inferior location consistent with ischemia.   Left ventricular function is abnormal. Nuclear stress EF: 51 %. Consider 2D Echocardiogram for accurate estimation of LVEF.   Findings are consistent with ischemia. The study is high risk.    Cardiac Studies  TTE 11/16/2022  1. Left ventricular ejection fraction, by estimation, is 60 to 65%. The  left ventricle has normal function. Left ventricular endocardial border  not optimally defined to evaluate regional wall motion. Left ventricular  diastolic parameters were normal.   2. Right ventricular systolic function is normal. The right ventricular  size is normal.   3. The mitral valve was not well visualized. No evidence of mitral valve  regurgitation. No evidence of mitral stenosis.   4. The aortic valve is tricuspid. Aortic valve regurgitation is not  visualized. No aortic stenosis is present.   Patient Profile  Christian Davis is a 67 y.o. male with asbestosis who was admitted on 11/15/2022 for non-STEMI.  Found to have two-vessel CAD not amendable to PCI.   Assessment & Plan   # Non-STEMI -Admitted with worsening chest discomfort.  Abnormal stress test.  Left heart catheterization with CTO of dominant circumflex with collaterals also with ostial LAD lesions.  Not amendable to PCI. -Plan is for CABG.  Echo shows normal LV function.  Awaiting CT surgery consult.  CABG Dopplers have been ordered. -Currently on aspirin and high intensity statin.  Continue  heparin for 48 hours. -TSH normal.  Right radial cath site clean and dry. -LVEDP was elevated.  He is status post one-time dose of Lasix.  Appears euvolemic to me. -On beta-blocker.  # HFpEF -LVEDP was elevated at the time of CAD.  Received a one-time dose of Lasix.  Euvolemic on my exam. -Add Aldactone 25 mg daily.  No need for Lasix.  Can pursue this as needed.  # Hypertension -BPs have been elevated here.  Add Aldactone 25 mg daily as above.  On metoprolol succinate 25 mg as above.  # Hyperlipidemia -Questionably milligrams daily.  # Memory impairment -Appropriate for me.  Wife reports forgetfulness.  No signs of cognitive decline here.  I do not believe this precludes him from CABG.  FEN -No intravenous fluids -Diet: Heart healthy -DVT PPx: Heparin drip -Code: Full -Disposition: Will remain in house for CABG.   For questions or updates, please contact [place] Please consult [email or web address] for contact info under        Signed, [place] B[place] T. Audie Box, MD, [place]  11/17/2022 9:42 AM

## 2022-11-17 NOTE — Progress Notes (Addendum)
Pt currently not in CP  Pt received OHS book, Move in the Tube sheet, and OHS careguide Pt was educated on approx length of surgery and stay, importance of ambulation and using IS, restrictions, and home needs.   Pt received IS and instructions on how to use.    Christen Bame 11/17/2022 11:49 AM

## 2022-11-17 NOTE — Progress Notes (Signed)
ANTICOAGULATION CONSULT NOTE - Follow Up Consult  Pharmacy Consult for Heparin Indication: chest pain/ACS/ CAD   Allergies  Allergen Reactions   Codeine Anxiety    Patient Measurements: Height: 5\' 11"  (180.3 cm) Weight: 113.5 kg (250 lb 3.6 oz) IBW/kg (Calculated) : 75.3 Heparin Dosing Weight: 102 kg  Vital Signs: Temp: 98 F (36.7 C) (04/03 0429) Temp Source: Oral (04/03 0429) BP: 141/85 (04/03 0429) Pulse Rate: 69 (04/03 0429)  Labs: Recent Labs    11/15/22 1108 11/15/22 1108 11/15/22 1312 11/16/22 0220 11/16/22 0559 11/16/22 1939 11/17/22 0029  HGB 16.3  --   --  14.9 14.9  --  15.1  HCT 48.2  --   --  44.9 43.2  --  43.5  PLT 192  --   --  174 168  --  171  HEPARINUNFRC  --    < >  --  0.57 0.51 0.35 0.53  CREATININE 0.89  --   --  0.87  --   --  0.80  TROPONINIHS 9  --  36*  --   --   --   --    < > = values in this interval not displayed.    Estimated Creatinine Clearance: 118 mL/min (by C-G formula based on SCr of 0.8 mg/dL).  Assessment: Pharmacy consulted to dose heparin in patient with chest pain/ACS.  Patient is not on anticoagulation prior to admission.  Cath 4/2 showed severe 2-vessel CAD not amenable to PCI.  TCTS consulted for possible CABG.  Heparin level remains therapeutic (0.53) on 1250 units/hr. CBC stable.  Goal of Therapy:  Heparin level 0.3-0.7 units/ml Monitor platelets by anticoagulation protocol: Yes   Plan:  Continue heparin drip at 1250 units/hr. Daily heparin level and CBC. Follow up surgical plans.  Arty Baumgartner, RPh 11/17/2022,8:00 AM

## 2022-11-17 NOTE — Progress Notes (Signed)
Nurse paged reporting pt feeling nervous about CABG tomorrow, requesting something for anxiety. VSS, doing well otherwise, no signs of decompensation. D/w Dr. Ellyn Hack. OK to give low dose Xanax. Ordered.

## 2022-11-17 NOTE — Progress Notes (Signed)
Pre-CABG ultrasound study completed.  ° °Please see CV Proc for preliminary results.  ° °Lailee Hoelzel, RDMS, RVT ° °

## 2022-11-18 ENCOUNTER — Inpatient Hospital Stay (HOSPITAL_COMMUNITY): Payer: Medicare Other

## 2022-11-18 ENCOUNTER — Encounter (HOSPITAL_COMMUNITY): Payer: Self-pay | Admitting: Cardiovascular Disease

## 2022-11-18 ENCOUNTER — Inpatient Hospital Stay (HOSPITAL_COMMUNITY): Payer: Medicare Other | Admitting: Anesthesiology

## 2022-11-18 ENCOUNTER — Other Ambulatory Visit: Payer: Self-pay

## 2022-11-18 ENCOUNTER — Inpatient Hospital Stay (HOSPITAL_COMMUNITY)
Admission: EM | Disposition: A | Discharge status: Home / Self Care | Payer: Self-pay | Source: Ambulatory Visit | Attending: Thoracic Surgery (Cardiothoracic Vascular Surgery)

## 2022-11-18 DIAGNOSIS — F418 Other specified anxiety disorders: Secondary | ICD-10-CM

## 2022-11-18 DIAGNOSIS — Z951 Presence of aortocoronary bypass graft: Secondary | ICD-10-CM

## 2022-11-18 DIAGNOSIS — M1990 Unspecified osteoarthritis, unspecified site: Secondary | ICD-10-CM

## 2022-11-18 DIAGNOSIS — I25119 Atherosclerotic heart disease of native coronary artery with unspecified angina pectoris: Secondary | ICD-10-CM

## 2022-11-18 DIAGNOSIS — I252 Old myocardial infarction: Secondary | ICD-10-CM

## 2022-11-18 DIAGNOSIS — I2510 Atherosclerotic heart disease of native coronary artery without angina pectoris: Secondary | ICD-10-CM | POA: Diagnosis not present

## 2022-11-18 HISTORY — PX: CORONARY ARTERY BYPASS GRAFT: SHX141

## 2022-11-18 HISTORY — PX: RADIAL ARTERY HARVEST: SHX5067

## 2022-11-18 HISTORY — PX: TEE WITHOUT CARDIOVERSION: SHX5443

## 2022-11-18 LAB — POCT I-STAT, CHEM 8
BUN: 15 mg/dL (ref 8–23)
BUN: 14 mg/dL (ref 8–23)
BUN: 13 mg/dL (ref 8–23)
BUN: 13 mg/dL (ref 8–23)
BUN: 13 mg/dL (ref 8–23)
Calcium, Ion: 1.2 mmol/L (ref 1.15–1.40)
Calcium, Ion: 1.14 mmol/L — ABNORMAL LOW (ref 1.15–1.40)
Calcium, Ion: 0.96 mmol/L — ABNORMAL LOW (ref 1.15–1.40)
Calcium, Ion: 1.14 mmol/L — ABNORMAL LOW (ref 1.15–1.40)
Calcium, Ion: 1.18 mmol/L (ref 1.15–1.40)
Chloride: 102 mmol/L (ref 98–111)
Chloride: 104 mmol/L (ref 98–111)
Chloride: 101 mmol/L (ref 98–111)
Chloride: 98 mmol/L (ref 98–111)
Chloride: 103 mmol/L (ref 98–111)
Creatinine, Ser: 0.7 mg/dL (ref 0.61–1.24)
Creatinine, Ser: 0.7 mg/dL (ref 0.61–1.24)
Creatinine, Ser: 0.4 mg/dL — ABNORMAL LOW (ref 0.61–1.24)
Creatinine, Ser: 0.5 mg/dL — ABNORMAL LOW (ref 0.61–1.24)
Creatinine, Ser: 0.5 mg/dL — ABNORMAL LOW (ref 0.61–1.24)
Glucose, Bld: 99 mg/dL (ref 70–99)
Glucose, Bld: 103 mg/dL — ABNORMAL HIGH (ref 70–99)
Glucose, Bld: 112 mg/dL — ABNORMAL HIGH (ref 70–99)
Glucose, Bld: 123 mg/dL — ABNORMAL HIGH (ref 70–99)
Glucose, Bld: 111 mg/dL — ABNORMAL HIGH (ref 70–99)
HCT: 45 % (ref 39.0–52.0)
HCT: 37 % — ABNORMAL LOW (ref 39.0–52.0)
HCT: 31 % — ABNORMAL LOW (ref 39.0–52.0)
HCT: 31 % — ABNORMAL LOW (ref 39.0–52.0)
HCT: 33 % — ABNORMAL LOW (ref 39.0–52.0)
Hemoglobin: 15.3 g/dL (ref 13.0–17.0)
Hemoglobin: 12.6 g/dL — ABNORMAL LOW (ref 13.0–17.0)
Hemoglobin: 10.5 g/dL — ABNORMAL LOW (ref 13.0–17.0)
Hemoglobin: 10.5 g/dL — ABNORMAL LOW (ref 13.0–17.0)
Hemoglobin: 11.2 g/dL — ABNORMAL LOW (ref 13.0–17.0)
Potassium: 4.5 mmol/L (ref 3.5–5.1)
Potassium: 4.3 mmol/L (ref 3.5–5.1)
Potassium: 6.3 mmol/L (ref 3.5–5.1)
Potassium: 6.6 mmol/L (ref 3.5–5.1)
Potassium: 5.2 mmol/L — ABNORMAL HIGH (ref 3.5–5.1)
Sodium: 136 mmol/L (ref 135–145)
Sodium: 136 mmol/L (ref 135–145)
Sodium: 130 mmol/L — ABNORMAL LOW (ref 135–145)
Sodium: 132 mmol/L — ABNORMAL LOW (ref 135–145)
Sodium: 133 mmol/L — ABNORMAL LOW (ref 135–145)
TCO2: 27 mmol/L (ref 22–32)
TCO2: 24 mmol/L (ref 22–32)
TCO2: 24 mmol/L (ref 22–32)
TCO2: 25 mmol/L (ref 22–32)
TCO2: 23 mmol/L (ref 22–32)

## 2022-11-18 LAB — POCT I-STAT 7, (LYTES, BLD GAS, ICA,H+H)
Acid-base deficit: 1 mmol/L (ref 0.0–2.0)
Acid-base deficit: 2 mmol/L (ref 0.0–2.0)
Acid-base deficit: 3 mmol/L — ABNORMAL HIGH (ref 0.0–2.0)
Acid-base deficit: 2 mmol/L (ref 0.0–2.0)
Acid-base deficit: 2 mmol/L (ref 0.0–2.0)
Acid-base deficit: 3 mmol/L — ABNORMAL HIGH (ref 0.0–2.0)
Acid-base deficit: 5 mmol/L — ABNORMAL HIGH (ref 0.0–2.0)
Acid-base deficit: 7 mmol/L — ABNORMAL HIGH (ref 0.0–2.0)
Acid-base deficit: 6 mmol/L — ABNORMAL HIGH (ref 0.0–2.0)
Bicarbonate: 25.3 mmol/L (ref 20.0–28.0)
Bicarbonate: 21.4 mmol/L (ref 20.0–28.0)
Bicarbonate: 21.5 mmol/L (ref 20.0–28.0)
Bicarbonate: 21.5 mmol/L (ref 20.0–28.0)
Bicarbonate: 22.8 mmol/L (ref 20.0–28.0)
Bicarbonate: 21.4 mmol/L (ref 20.0–28.0)
Bicarbonate: 19.8 mmol/L — ABNORMAL LOW (ref 20.0–28.0)
Bicarbonate: 18.8 mmol/L — ABNORMAL LOW (ref 20.0–28.0)
Bicarbonate: 20 mmol/L (ref 20.0–28.0)
Calcium, Ion: 1.21 mmol/L (ref 1.15–1.40)
Calcium, Ion: 0.97 mmol/L — ABNORMAL LOW (ref 1.15–1.40)
Calcium, Ion: 1.03 mmol/L — ABNORMAL LOW (ref 1.15–1.40)
Calcium, Ion: 1.01 mmol/L — ABNORMAL LOW (ref 1.15–1.40)
Calcium, Ion: 1.35 mmol/L (ref 1.15–1.40)
Calcium, Ion: 1.24 mmol/L (ref 1.15–1.40)
Calcium, Ion: 1.2 mmol/L (ref 1.15–1.40)
Calcium, Ion: 1.2 mmol/L (ref 1.15–1.40)
Calcium, Ion: 1.18 mmol/L (ref 1.15–1.40)
HCT: 43 % (ref 39.0–52.0)
HCT: 29 % — ABNORMAL LOW (ref 39.0–52.0)
HCT: 31 % — ABNORMAL LOW (ref 39.0–52.0)
HCT: 29 % — ABNORMAL LOW (ref 39.0–52.0)
HCT: 31 % — ABNORMAL LOW (ref 39.0–52.0)
HCT: 31 % — ABNORMAL LOW (ref 39.0–52.0)
HCT: 33 % — ABNORMAL LOW (ref 39.0–52.0)
HCT: 35 % — ABNORMAL LOW (ref 39.0–52.0)
HCT: 36 % — ABNORMAL LOW (ref 39.0–52.0)
Hemoglobin: 14.6 g/dL (ref 13.0–17.0)
Hemoglobin: 10.5 g/dL — ABNORMAL LOW (ref 13.0–17.0)
Hemoglobin: 9.9 g/dL — ABNORMAL LOW (ref 13.0–17.0)
Hemoglobin: 10.5 g/dL — ABNORMAL LOW (ref 13.0–17.0)
Hemoglobin: 9.9 g/dL — ABNORMAL LOW (ref 13.0–17.0)
Hemoglobin: 10.5 g/dL — ABNORMAL LOW (ref 13.0–17.0)
Hemoglobin: 11.2 g/dL — ABNORMAL LOW (ref 13.0–17.0)
Hemoglobin: 11.9 g/dL — ABNORMAL LOW (ref 13.0–17.0)
Hemoglobin: 12.2 g/dL — ABNORMAL LOW (ref 13.0–17.0)
O2 Saturation: 100 %
O2 Saturation: 100 %
O2 Saturation: 100 %
O2 Saturation: 100 %
O2 Saturation: 100 %
O2 Saturation: 100 %
O2 Saturation: 96 %
O2 Saturation: 99 %
O2 Saturation: 97 %
Patient temperature: 35
Patient temperature: 38
Patient temperature: 38.4
Potassium: 4.5 mmol/L (ref 3.5–5.1)
Potassium: 4.6 mmol/L (ref 3.5–5.1)
Potassium: 6.2 mmol/L — ABNORMAL HIGH (ref 3.5–5.1)
Potassium: 5.9 mmol/L — ABNORMAL HIGH (ref 3.5–5.1)
Potassium: 6.6 mmol/L (ref 3.5–5.1)
Potassium: 5.2 mmol/L — ABNORMAL HIGH (ref 3.5–5.1)
Potassium: 4.3 mmol/L (ref 3.5–5.1)
Potassium: 4.9 mmol/L (ref 3.5–5.1)
Potassium: 4.6 mmol/L (ref 3.5–5.1)
Sodium: 135 mmol/L (ref 135–145)
Sodium: 128 mmol/L — ABNORMAL LOW (ref 135–145)
Sodium: 131 mmol/L — ABNORMAL LOW (ref 135–145)
Sodium: 131 mmol/L — ABNORMAL LOW (ref 135–145)
Sodium: 132 mmol/L — ABNORMAL LOW (ref 135–145)
Sodium: 133 mmol/L — ABNORMAL LOW (ref 135–145)
Sodium: 136 mmol/L (ref 135–145)
Sodium: 137 mmol/L (ref 135–145)
Sodium: 137 mmol/L (ref 135–145)
TCO2: 27 mmol/L (ref 22–32)
TCO2: 22 mmol/L (ref 22–32)
TCO2: 22 mmol/L (ref 22–32)
TCO2: 22 mmol/L (ref 22–32)
TCO2: 24 mmol/L (ref 22–32)
TCO2: 22 mmol/L (ref 22–32)
TCO2: 21 mmol/L — ABNORMAL LOW (ref 22–32)
TCO2: 20 mmol/L — ABNORMAL LOW (ref 22–32)
TCO2: 21 mmol/L — ABNORMAL LOW (ref 22–32)
pCO2 arterial: 47.9 mmHg (ref 32–48)
pCO2 arterial: 31.3 mmHg — ABNORMAL LOW (ref 32–48)
pCO2 arterial: 34.8 mmHg (ref 32–48)
pCO2 arterial: 31 mmHg — ABNORMAL LOW (ref 32–48)
pCO2 arterial: 39.4 mmHg (ref 32–48)
pCO2 arterial: 34.7 mmHg (ref 32–48)
pCO2 arterial: 31.9 mmHg — ABNORMAL LOW (ref 32–48)
pCO2 arterial: 39.8 mmHg (ref 32–48)
pCO2 arterial: 41.3 mmHg (ref 32–48)
pH, Arterial: 7.33 — ABNORMAL LOW (ref 7.35–7.45)
pH, Arterial: 7.396 (ref 7.35–7.45)
pH, Arterial: 7.445 (ref 7.35–7.45)
pH, Arterial: 7.371 (ref 7.35–7.45)
pH, Arterial: 7.449 (ref 7.35–7.45)
pH, Arterial: 7.399 (ref 7.35–7.45)
pH, Arterial: 7.391 (ref 7.35–7.45)
pH, Arterial: 7.286 — ABNORMAL LOW (ref 7.35–7.45)
pH, Arterial: 7.299 — ABNORMAL LOW (ref 7.35–7.45)
pO2, Arterial: 465 mmHg — ABNORMAL HIGH (ref 83–108)
pO2, Arterial: 283 mmHg — ABNORMAL HIGH (ref 83–108)
pO2, Arterial: 322 mmHg — ABNORMAL HIGH (ref 83–108)
pO2, Arterial: 273 mmHg — ABNORMAL HIGH (ref 83–108)
pO2, Arterial: 337 mmHg — ABNORMAL HIGH (ref 83–108)
pO2, Arterial: 202 mmHg — ABNORMAL HIGH (ref 83–108)
pO2, Arterial: 75 mmHg — ABNORMAL LOW (ref 83–108)
pO2, Arterial: 136 mmHg — ABNORMAL HIGH (ref 83–108)
pO2, Arterial: 111 mmHg — ABNORMAL HIGH (ref 83–108)

## 2022-11-18 LAB — POCT I-STAT EG7
Acid-base deficit: 2 mmol/L (ref 0.0–2.0)
Bicarbonate: 23.1 mmol/L (ref 20.0–28.0)
Calcium, Ion: 1.01 mmol/L — ABNORMAL LOW (ref 1.15–1.40)
HCT: 31 % — ABNORMAL LOW (ref 39.0–52.0)
Hemoglobin: 10.5 g/dL — ABNORMAL LOW (ref 13.0–17.0)
O2 Saturation: 79 %
Potassium: 4.2 mmol/L (ref 3.5–5.1)
Sodium: 135 mmol/L (ref 135–145)
TCO2: 24 mmol/L (ref 22–32)
pCO2, Ven: 39.3 mmHg — ABNORMAL LOW (ref 44–60)
pH, Ven: 7.377 (ref 7.25–7.43)
pO2, Ven: 44 mmHg (ref 32–45)

## 2022-11-18 LAB — APTT: aPTT: 27 seconds (ref 24–36)

## 2022-11-18 LAB — CBC
HCT: 44.1 % (ref 39.0–52.0)
HCT: 34 % — ABNORMAL LOW (ref 39.0–52.0)
HCT: 35.5 % — ABNORMAL LOW (ref 39.0–52.0)
Hemoglobin: 15 g/dL (ref 13.0–17.0)
Hemoglobin: 11.5 g/dL — ABNORMAL LOW (ref 13.0–17.0)
Hemoglobin: 11.9 g/dL — ABNORMAL LOW (ref 13.0–17.0)
MCH: 31.9 pg (ref 26.0–34.0)
MCH: 32.1 pg (ref 26.0–34.0)
MCH: 32 pg (ref 26.0–34.0)
MCHC: 34 g/dL (ref 30.0–36.0)
MCHC: 33.8 g/dL (ref 30.0–36.0)
MCHC: 33.5 g/dL (ref 30.0–36.0)
MCV: 93.8 fL (ref 80.0–100.0)
MCV: 95 fL (ref 80.0–100.0)
MCV: 95.4 fL (ref 80.0–100.0)
Platelets: 160 10*3/uL (ref 150–400)
Platelets: 88 10*3/uL — ABNORMAL LOW (ref 150–400)
Platelets: 159 10*3/uL (ref 150–400)
RBC: 4.7 MIL/uL (ref 4.22–5.81)
RBC: 3.58 MIL/uL — ABNORMAL LOW (ref 4.22–5.81)
RBC: 3.72 MIL/uL — ABNORMAL LOW (ref 4.22–5.81)
RDW: 12.2 % (ref 11.5–15.5)
RDW: 12.1 % (ref 11.5–15.5)
RDW: 12.3 % (ref 11.5–15.5)
WBC: 10.1 10*3/uL (ref 4.0–10.5)
WBC: 13.4 10*3/uL — ABNORMAL HIGH (ref 4.0–10.5)
WBC: 16.9 10*3/uL — ABNORMAL HIGH (ref 4.0–10.5)
nRBC: 0 % (ref 0.0–0.2)
nRBC: 0 % (ref 0.0–0.2)
nRBC: 0 % (ref 0.0–0.2)

## 2022-11-18 LAB — BASIC METABOLIC PANEL
Anion gap: 6 (ref 5–15)
Anion gap: 8 (ref 5–15)
BUN: 18 mg/dL (ref 8–23)
BUN: 13 mg/dL (ref 8–23)
CO2: 25 mmol/L (ref 22–32)
CO2: 22 mmol/L (ref 22–32)
Calcium: 8.8 mg/dL — ABNORMAL LOW (ref 8.9–10.3)
Calcium: 7.7 mg/dL — ABNORMAL LOW (ref 8.9–10.3)
Chloride: 102 mmol/L (ref 98–111)
Chloride: 106 mmol/L (ref 98–111)
Creatinine, Ser: 0.92 mg/dL (ref 0.61–1.24)
Creatinine, Ser: 0.84 mg/dL (ref 0.61–1.24)
GFR, Estimated: >60 mL/min (ref >60)
GFR, Estimated: >60 mL/min (ref >60)
Glucose, Bld: 96 mg/dL (ref 70–99)
Glucose, Bld: 132 mg/dL — ABNORMAL HIGH (ref 70–99)
Potassium: 4 mmol/L (ref 3.5–5.1)
Potassium: 4.5 mmol/L (ref 3.5–5.1)
Sodium: 133 mmol/L — ABNORMAL LOW (ref 135–145)
Sodium: 136 mmol/L (ref 135–145)

## 2022-11-18 LAB — HEMOGLOBIN AND HEMATOCRIT, BLOOD
HCT: 29.1 % — ABNORMAL LOW (ref 39.0–52.0)
Hemoglobin: 10.2 g/dL — ABNORMAL LOW (ref 13.0–17.0)

## 2022-11-18 LAB — GLUCOSE, CAPILLARY
Glucose-Capillary: 128 mg/dL — ABNORMAL HIGH (ref 70–99)
Glucose-Capillary: 139 mg/dL — ABNORMAL HIGH (ref 70–99)
Glucose-Capillary: 115 mg/dL — ABNORMAL HIGH (ref 70–99)
Glucose-Capillary: 138 mg/dL — ABNORMAL HIGH (ref 70–99)
Glucose-Capillary: 132 mg/dL — ABNORMAL HIGH (ref 70–99)
Glucose-Capillary: 113 mg/dL — ABNORMAL HIGH (ref 70–99)

## 2022-11-18 LAB — ECHO INTRAOPERATIVE TEE
Height: 71 in
Weight: 4038.83 oz

## 2022-11-18 LAB — PROTIME-INR
INR: 1.5 — ABNORMAL HIGH (ref 0.8–1.2)
Prothrombin Time: 17.5 seconds — ABNORMAL HIGH (ref 11.4–15.2)

## 2022-11-18 LAB — PLATELET COUNT: Platelets: 106 10*3/uL — ABNORMAL LOW (ref 150–400)

## 2022-11-18 LAB — HEPARIN LEVEL (UNFRACTIONATED): Heparin Unfractionated: 0.69 IU/mL (ref 0.30–0.70)

## 2022-11-18 SURGERY — CORONARY ARTERY BYPASS GRAFTING (CABG)
Anesthesia: General | Site: Chest

## 2022-11-18 MED ORDER — CEFAZOLIN SODIUM-DEXTROSE 2-4 GM/100ML-% IV SOLN
2.0000 g | Freq: Three times a day (TID) | INTRAVENOUS | Status: AC
  Administered 2022-11-18 – 2022-11-20 (×6): 2 g via INTRAVENOUS
  Filled 2022-11-18 (×6): qty 100

## 2022-11-18 MED ORDER — HEPARIN SODIUM (PORCINE) 1000 UNIT/ML IJ SOLN
INTRAMUSCULAR | Status: DC | PRN
  Administered 2022-11-18: 2000 [IU] via INTRAVENOUS
  Administered 2022-11-18: 37000 [IU] via INTRAVENOUS

## 2022-11-18 MED ORDER — TRAMADOL HCL 50 MG PO TABS
50.0000 mg | ORAL_TABLET | ORAL | Status: DC | PRN
  Administered 2022-11-18: 50 mg via ORAL
  Administered 2022-11-19: 100 mg via ORAL
  Filled 2022-11-18 (×3): qty 2
  Filled 2022-11-18: qty 1

## 2022-11-18 MED ORDER — 0.9 % SODIUM CHLORIDE (POUR BTL) OPTIME
TOPICAL | Status: DC | PRN
  Administered 2022-11-18: 6000 mL

## 2022-11-18 MED ORDER — METOPROLOL TARTRATE 5 MG/5ML IV SOLN: 2.5000 mg | INTRAVENOUS | Status: DC | PRN

## 2022-11-18 MED ORDER — ROSUVASTATIN CALCIUM 20 MG PO TABS
40.0000 mg | ORAL_TABLET | Freq: Every day | ORAL | Status: DC
  Administered 2022-11-19 – 2022-11-22 (×4): 40 mg via ORAL
  Filled 2022-11-18 (×4): qty 2

## 2022-11-18 MED ORDER — VANCOMYCIN HCL IN DEXTROSE 1-5 GM/200ML-% IV SOLN
1000.0000 mg | Freq: Once | INTRAVENOUS | Status: AC
  Administered 2022-11-18: 1000 mg via INTRAVENOUS
  Filled 2022-11-18: qty 200

## 2022-11-18 MED ORDER — ALBUMIN HUMAN 5 % IV SOLN
250.0000 mL | INTRAVENOUS | Status: AC | PRN
  Administered 2022-11-18 (×3): 12.5 g via INTRAVENOUS
  Filled 2022-11-18 (×2): qty 250

## 2022-11-18 MED ORDER — LACTATED RINGERS IV SOLN: INTRAVENOUS | Status: DC | PRN

## 2022-11-18 MED ORDER — ROCURONIUM BROMIDE 10 MG/ML (PF) SYRINGE
PREFILLED_SYRINGE | INTRAVENOUS | Status: DC | PRN
  Administered 2022-11-18: 100 mg via INTRAVENOUS
  Administered 2022-11-18 (×3): 50 mg via INTRAVENOUS

## 2022-11-18 MED ORDER — ACETAMINOPHEN 500 MG PO TABS
1000.0000 mg | ORAL_TABLET | Freq: Four times a day (QID) | ORAL | Status: DC
  Administered 2022-11-18 – 2022-11-22 (×13): 1000 mg via ORAL
  Filled 2022-11-18 (×13): qty 2

## 2022-11-18 MED ORDER — DEXMEDETOMIDINE HCL IN NACL 400 MCG/100ML IV SOLN
0.0000 ug/kg/h | INTRAVENOUS | Status: DC
  Administered 2022-11-18: 0.7 ug/kg/h via INTRAVENOUS
  Filled 2022-11-18: qty 100

## 2022-11-18 MED ORDER — HEPARIN SODIUM (PORCINE) 1000 UNIT/ML IJ SOLN
INTRAMUSCULAR | Status: AC
  Filled 2022-11-18: qty 1

## 2022-11-18 MED ORDER — INSULIN REGULAR(HUMAN) IN NACL 100-0.9 UT/100ML-% IV SOLN: INTRAVENOUS | Status: DC

## 2022-11-18 MED ORDER — ONDANSETRON HCL 4 MG/2ML IJ SOLN: 4.0000 mg | Freq: Four times a day (QID) | INTRAMUSCULAR | Status: DC | PRN

## 2022-11-18 MED ORDER — SODIUM CHLORIDE (PF) 0.9 % IJ SOLN: OROMUCOSAL | Status: DC | PRN

## 2022-11-18 MED ORDER — CITALOPRAM HYDROBROMIDE 20 MG PO TABS
20.0000 mg | ORAL_TABLET | Freq: Every day | ORAL | Status: DC
  Administered 2022-11-19 – 2022-11-21 (×3): 20 mg via ORAL
  Filled 2022-11-18 (×3): qty 1

## 2022-11-18 MED ORDER — LACTATED RINGERS IV SOLN: INTRAVENOUS | Status: DC

## 2022-11-18 MED ORDER — ACETAMINOPHEN 160 MG/5ML PO SOLN: 650.0000 mg | Freq: Once | ORAL | Status: AC

## 2022-11-18 MED ORDER — BISACODYL 5 MG PO TBEC
10.0000 mg | DELAYED_RELEASE_TABLET | Freq: Every day | ORAL | Status: DC
  Administered 2022-11-19 – 2022-11-21 (×3): 10 mg via ORAL
  Filled 2022-11-18 (×3): qty 2

## 2022-11-18 MED ORDER — PLASMA-LYTE A IV SOLN: INTRAVENOUS | Status: DC | PRN

## 2022-11-18 MED ORDER — PANTOPRAZOLE SODIUM 40 MG PO TBEC
40.0000 mg | DELAYED_RELEASE_TABLET | Freq: Every day | ORAL | Status: DC
  Administered 2022-11-20 – 2022-11-22 (×3): 40 mg via ORAL
  Filled 2022-11-18 (×3): qty 1

## 2022-11-18 MED ORDER — MIDAZOLAM HCL (PF) 10 MG/2ML IJ SOLN
INTRAMUSCULAR | Status: AC
  Filled 2022-11-18: qty 2

## 2022-11-18 MED ORDER — ASPIRIN 325 MG PO TBEC
325.0000 mg | DELAYED_RELEASE_TABLET | Freq: Every day | ORAL | Status: DC
  Administered 2022-11-19 – 2022-11-21 (×3): 325 mg via ORAL
  Filled 2022-11-18 (×3): qty 1

## 2022-11-18 MED ORDER — SUCCINYLCHOLINE CHLORIDE 200 MG/10ML IV SOSY
PREFILLED_SYRINGE | INTRAVENOUS | Status: AC
  Filled 2022-11-18: qty 10

## 2022-11-18 MED ORDER — SODIUM BICARBONATE 8.4 % IV SOLN
50.0000 meq | Freq: Once | INTRAVENOUS | Status: AC
  Administered 2022-11-18: 50 meq via INTRAVENOUS

## 2022-11-18 MED ORDER — ACETAMINOPHEN 160 MG/5ML PO SOLN: 1000.0000 mg | Freq: Four times a day (QID) | ORAL | Status: DC

## 2022-11-18 MED ORDER — NITROGLYCERIN IN D5W 200-5 MCG/ML-% IV SOLN
0.0000 ug/min | INTRAVENOUS | Status: DC
  Administered 2022-11-19: 5 ug/min via INTRAVENOUS

## 2022-11-18 MED ORDER — MIDAZOLAM HCL 5 MG/5ML IJ SOLN
INTRAMUSCULAR | Status: DC | PRN
  Administered 2022-11-18 (×4): 1 mg via INTRAVENOUS
  Administered 2022-11-18: 4 mg via INTRAVENOUS

## 2022-11-18 MED ORDER — MIDAZOLAM HCL 2 MG/2ML IJ SOLN: 2.0000 mg | INTRAMUSCULAR | Status: DC | PRN

## 2022-11-18 MED ORDER — FENTANYL CITRATE (PF) 250 MCG/5ML IJ SOLN
INTRAMUSCULAR | Status: AC
  Filled 2022-11-18: qty 5

## 2022-11-18 MED ORDER — PHENYLEPHRINE 80 MCG/ML (10ML) SYRINGE FOR IV PUSH (FOR BLOOD PRESSURE SUPPORT)
PREFILLED_SYRINGE | INTRAVENOUS | Status: AC
  Filled 2022-11-18: qty 10

## 2022-11-18 MED ORDER — SODIUM CHLORIDE 0.9 % IV SOLN: INTRAVENOUS | Status: DC

## 2022-11-18 MED ORDER — ACETAMINOPHEN 650 MG RE SUPP
650.0000 mg | Freq: Once | RECTAL | Status: AC
  Administered 2022-11-18: 650 mg via RECTAL

## 2022-11-18 MED ORDER — FAMOTIDINE IN NACL 20-0.9 MG/50ML-% IV SOLN
20.0000 mg | Freq: Two times a day (BID) | INTRAVENOUS | Status: AC
  Administered 2022-11-18 (×2): 20 mg via INTRAVENOUS
  Filled 2022-11-18 (×2): qty 50

## 2022-11-18 MED ORDER — PROTAMINE SULFATE 10 MG/ML IV SOLN
INTRAVENOUS | Status: AC
  Filled 2022-11-18: qty 10

## 2022-11-18 MED ORDER — CHLORHEXIDINE GLUCONATE CLOTH 2 % EX PADS
6.0000 | MEDICATED_PAD | Freq: Every day | CUTANEOUS | Status: DC
  Administered 2022-11-18 – 2022-11-22 (×4): 6 via TOPICAL

## 2022-11-18 MED ORDER — MAGNESIUM SULFATE 4 GM/100ML IV SOLN
4.0000 g | Freq: Once | INTRAVENOUS | Status: AC
  Administered 2022-11-18: 4 g via INTRAVENOUS
  Filled 2022-11-18: qty 100

## 2022-11-18 MED ORDER — HEMOSTATIC AGENTS (NO CHARGE) OPTIME
TOPICAL | Status: DC | PRN
  Administered 2022-11-18 (×2): 1 via TOPICAL

## 2022-11-18 MED ORDER — PHENYLEPHRINE HCL-NACL 20-0.9 MG/250ML-% IV SOLN
0.0000 ug/min | INTRAVENOUS | Status: DC
  Administered 2022-11-18: 20 ug/min via INTRAVENOUS
  Filled 2022-11-18: qty 250

## 2022-11-18 MED ORDER — MORPHINE SULFATE (PF) 2 MG/ML IV SOLN
1.0000 mg | INTRAVENOUS | Status: DC | PRN
  Administered 2022-11-18 – 2022-11-19 (×4): 2 mg via INTRAVENOUS
  Filled 2022-11-18 (×4): qty 1

## 2022-11-18 MED ORDER — PROPOFOL 10 MG/ML IV BOLUS
INTRAVENOUS | Status: DC | PRN
  Administered 2022-11-18 (×2): 10 mg via INTRAVENOUS
  Administered 2022-11-18: 50 mg via INTRAVENOUS
  Administered 2022-11-18: 30 mg via INTRAVENOUS
  Administered 2022-11-18: 100 mg via INTRAVENOUS

## 2022-11-18 MED ORDER — LIDOCAINE 2% (20 MG/ML) 5 ML SYRINGE
INTRAMUSCULAR | Status: AC
  Filled 2022-11-18: qty 5

## 2022-11-18 MED ORDER — ROCURONIUM BROMIDE 10 MG/ML (PF) SYRINGE
PREFILLED_SYRINGE | INTRAVENOUS | Status: AC
  Filled 2022-11-18: qty 10

## 2022-11-18 MED ORDER — SODIUM CHLORIDE 0.45 % IV SOLN: INTRAVENOUS | Status: DC | PRN

## 2022-11-18 MED ORDER — SODIUM CHLORIDE 0.9 % IV SOLN: INTRAVENOUS | Status: DC | PRN

## 2022-11-18 MED ORDER — CHLORHEXIDINE GLUCONATE 0.12 % MT SOLN
15.0000 mL | OROMUCOSAL | Status: AC
  Administered 2022-11-18: 15 mL via OROMUCOSAL
  Filled 2022-11-18: qty 15

## 2022-11-18 MED ORDER — SODIUM CHLORIDE 0.9% FLUSH
3.0000 mL | Freq: Two times a day (BID) | INTRAVENOUS | Status: DC
  Administered 2022-11-19: 10 mL via INTRAVENOUS
  Administered 2022-11-19 – 2022-11-20 (×2): 3 mL via INTRAVENOUS

## 2022-11-18 MED ORDER — OXYCODONE HCL 5 MG PO TABS
5.0000 mg | ORAL_TABLET | ORAL | Status: DC | PRN
  Administered 2022-11-18 – 2022-11-19 (×4): 5 mg via ORAL
  Filled 2022-11-18 (×4): qty 1

## 2022-11-18 MED ORDER — METOPROLOL TARTRATE 12.5 MG HALF TABLET: 12.5000 mg | ORAL_TABLET | Freq: Two times a day (BID) | ORAL | Status: DC

## 2022-11-18 MED ORDER — HEPARIN SODIUM (PORCINE) 1000 UNIT/ML IJ SOLN
INTRAMUSCULAR | Status: AC
  Filled 2022-11-18: qty 10

## 2022-11-18 MED ORDER — SODIUM CHLORIDE 0.9 % IV SOLN: 250.0000 mL | INTRAVENOUS | Status: DC

## 2022-11-18 MED ORDER — ROCURONIUM BROMIDE 10 MG/ML (PF) SYRINGE
PREFILLED_SYRINGE | INTRAVENOUS | Status: AC
  Filled 2022-11-18: qty 20

## 2022-11-18 MED ORDER — FENTANYL CITRATE (PF) 250 MCG/5ML IJ SOLN
INTRAMUSCULAR | Status: DC | PRN
  Administered 2022-11-18: 50 ug via INTRAVENOUS
  Administered 2022-11-18: 100 ug via INTRAVENOUS
  Administered 2022-11-18: 150 ug via INTRAVENOUS
  Administered 2022-11-18: 100 ug via INTRAVENOUS
  Administered 2022-11-18: 200 ug via INTRAVENOUS
  Administered 2022-11-18 (×2): 100 ug via INTRAVENOUS
  Administered 2022-11-18 (×3): 50 ug via INTRAVENOUS
  Administered 2022-11-18: 100 ug via INTRAVENOUS

## 2022-11-18 MED ORDER — PHENYLEPHRINE 80 MCG/ML (10ML) SYRINGE FOR IV PUSH (FOR BLOOD PRESSURE SUPPORT)
PREFILLED_SYRINGE | INTRAVENOUS | Status: DC | PRN
  Administered 2022-11-18: 80 ug via INTRAVENOUS
  Administered 2022-11-18: 160 ug via INTRAVENOUS
  Administered 2022-11-18: 40 ug via INTRAVENOUS

## 2022-11-18 MED ORDER — POTASSIUM CHLORIDE 10 MEQ/50ML IV SOLN: 10.0000 meq | INTRAVENOUS | Status: AC

## 2022-11-18 MED ORDER — ACETAMINOPHEN 500 MG PO TABS: 1000.0000 mg | ORAL_TABLET | Freq: Once | ORAL | Status: DC

## 2022-11-18 MED ORDER — PROPOFOL 10 MG/ML IV BOLUS
INTRAVENOUS | Status: AC
  Filled 2022-11-18: qty 20

## 2022-11-18 MED ORDER — DOCUSATE SODIUM 100 MG PO CAPS
200.0000 mg | ORAL_CAPSULE | Freq: Every day | ORAL | Status: DC
  Administered 2022-11-19 – 2022-11-21 (×3): 200 mg via ORAL
  Filled 2022-11-18 (×3): qty 2

## 2022-11-18 MED ORDER — ALBUMIN HUMAN 5 % IV SOLN: INTRAVENOUS | Status: DC | PRN

## 2022-11-18 MED ORDER — ASPIRIN 81 MG PO CHEW: 324.0000 mg | CHEWABLE_TABLET | Freq: Every day | ORAL | Status: DC

## 2022-11-18 MED ORDER — METOPROLOL TARTRATE 25 MG/10 ML ORAL SUSPENSION: 12.5000 mg | Freq: Two times a day (BID) | ORAL | Status: DC

## 2022-11-18 MED ORDER — BISACODYL 10 MG RE SUPP: 10.0000 mg | Freq: Every day | RECTAL | Status: DC

## 2022-11-18 MED ORDER — SODIUM CHLORIDE 0.9% FLUSH
3.0000 mL | INTRAVENOUS | Status: DC | PRN
  Administered 2022-11-19 (×2): 3 mL via INTRAVENOUS
  Administered 2022-11-19: 10 mL via INTRAVENOUS

## 2022-11-18 MED ORDER — SUCCINYLCHOLINE CHLORIDE 200 MG/10ML IV SOSY
PREFILLED_SYRINGE | INTRAVENOUS | Status: DC | PRN
  Administered 2022-11-18: 120 mg via INTRAVENOUS

## 2022-11-18 MED ORDER — PROTAMINE SULFATE 10 MG/ML IV SOLN
INTRAVENOUS | Status: DC | PRN
  Administered 2022-11-18: 10 mg via INTRAVENOUS
  Administered 2022-11-18: 340 mg via INTRAVENOUS

## 2022-11-18 MED ORDER — DEXTROSE 50 % IV SOLN: 0.0000 mL | INTRAVENOUS | Status: DC | PRN

## 2022-11-18 MED ORDER — PROTAMINE SULFATE 10 MG/ML IV SOLN
INTRAVENOUS | Status: AC
  Filled 2022-11-18: qty 25

## 2022-11-18 MED ORDER — LACTATED RINGERS IV SOLN: 500.0000 mL | Freq: Once | INTRAVENOUS | Status: DC | PRN

## 2022-11-18 SURGICAL SUPPLY — 117 items
ADH SKN CLS APL DERMABOND .7 (GAUZE/BANDAGES/DRESSINGS) ×3
ANTIFOG SOL W/FOAM PAD STRL (MISCELLANEOUS) ×3
APPLIER CLIP 9.375 SM OPEN (CLIP)
APR CLP SM 9.3 20 MLT OPN (CLIP)
BAG DECANTER FOR FLEXI CONT (MISCELLANEOUS) ×3 IMPLANT
BLADE CLIPPER SURG (BLADE) ×3 IMPLANT
BLADE STERNUM SYSTEM 6 (BLADE) ×3 IMPLANT
BLADE SURG 15 STRL LF DISP TIS (BLADE) IMPLANT
BLADE SURG 15 STRL SS (BLADE)
BNDG CMPR 5X62 HK CLSR LF (GAUZE/BANDAGES/DRESSINGS) ×3
BNDG ELASTIC 4X5.8 VLCR STR LF (GAUZE/BANDAGES/DRESSINGS) ×3 IMPLANT
BNDG ELASTIC 6INX 5YD STR LF (GAUZE/BANDAGES/DRESSINGS) IMPLANT
BNDG ELASTIC 6X5.8 VLCR STR LF (GAUZE/BANDAGES/DRESSINGS) ×3 IMPLANT
BNDG GAUZE DERMACEA FLUFF 4 (GAUZE/BANDAGES/DRESSINGS) ×3 IMPLANT
BNDG GZE DERMACEA 4 6PLY (GAUZE/BANDAGES/DRESSINGS) ×6
CANISTER SUCT 3000ML PPV (MISCELLANEOUS) ×3 IMPLANT
CANNULA EZ GLIDE 8.0 24FR (CANNULA) IMPLANT
CANNULA EZ GLIDE AORTIC 21FR (CANNULA) ×3 IMPLANT
CANNULA MC2 2 STG 36/46 CONN (CANNULA) IMPLANT
CATH CPB KIT HENDRICKSON (MISCELLANEOUS) ×3 IMPLANT
CATH ROBINSON RED A/P 18FR (CATHETERS) ×3 IMPLANT
CATH THORACIC 36FR (CATHETERS) ×3 IMPLANT
CATH THORACIC 36FR RT ANG (CATHETERS) ×3 IMPLANT
CLIP APPLIE 9.375 SM OPEN (CLIP) IMPLANT
CLIP TI MEDIUM 24 (CLIP) IMPLANT
CLIP TI WIDE RED SMALL 24 (CLIP) IMPLANT
CONTAINER PROTECT SURGISLUSH (MISCELLANEOUS) ×6 IMPLANT
COVER MAYO STAND STRL (DRAPES) IMPLANT
CUFF TOURN SGL QUICK 18X4 (TOURNIQUET CUFF) IMPLANT
CUFF TOURN SGL QUICK 24 (TOURNIQUET CUFF)
CUFF TRNQT CYL 24X4X16.5-23 (TOURNIQUET CUFF) IMPLANT
DERMABOND ADVANCED .7 DNX12 (GAUZE/BANDAGES/DRESSINGS) IMPLANT
DRAIN JACKSON PRATT 10MM FLAT (MISCELLANEOUS) IMPLANT
DRAPE CARDIOVASCULAR INCISE (DRAPES) ×3
DRAPE EXTREMITY T 121X128X90 (DISPOSABLE) ×3 IMPLANT
DRAPE HALF SHEET 40X57 (DRAPES) IMPLANT
DRAPE SRG 135X102X78XABS (DRAPES) ×3 IMPLANT
DRAPE WARM FLUID 44X44 (DRAPES) ×3 IMPLANT
DRSG COVADERM 4X14 (GAUZE/BANDAGES/DRESSINGS) ×3 IMPLANT
ELECT REM PT RETURN 9FT ADLT (ELECTROSURGICAL) ×6
ELECTRODE REM PT RTRN 9FT ADLT (ELECTROSURGICAL) ×6 IMPLANT
EVACUATOR SILICONE 100CC (DRAIN) IMPLANT
FELT TEFLON 1X6 (MISCELLANEOUS) ×6 IMPLANT
GAUZE 4X4 16PLY ~~LOC~~+RFID DBL (SPONGE) ×3 IMPLANT
GAUZE SPONGE 4X4 12PLY STRL (GAUZE/BANDAGES/DRESSINGS) ×6 IMPLANT
GEL ULTRASOUND 20GR AQUASONIC (MISCELLANEOUS) IMPLANT
GLOVE BIO SURGEON STRL SZ 6.5 (GLOVE) IMPLANT
GLOVE BIOGEL PI IND STRL 6.5 (GLOVE) IMPLANT
GLOVE BIOGEL PI IND STRL 7.0 (GLOVE) IMPLANT
GLOVE SS BIOGEL STRL SZ 7 (GLOVE) IMPLANT
GLOVE SS BIOGEL STRL SZ 7.5 (GLOVE) ×3 IMPLANT
GLOVE SURG SIGNA 7.5 PF LTX (GLOVE) ×9 IMPLANT
GLOVE SURG SS PI 6.0 STRL IVOR (GLOVE) IMPLANT
GOWN STRL REUS W/ TWL LRG LVL3 (GOWN DISPOSABLE) ×12 IMPLANT
GOWN STRL REUS W/ TWL XL LVL3 (GOWN DISPOSABLE) ×6 IMPLANT
GOWN STRL REUS W/TWL LRG LVL3 (GOWN DISPOSABLE) ×21
GOWN STRL REUS W/TWL XL LVL3 (GOWN DISPOSABLE) ×6
HEMOSTAT POWDER SURGIFOAM 1G (HEMOSTASIS) ×9 IMPLANT
HEMOSTAT SURGICEL 2X14 (HEMOSTASIS) ×3 IMPLANT
INSERT FOGARTY XLG (MISCELLANEOUS) IMPLANT
KIT BASIN OR (CUSTOM PROCEDURE TRAY) ×3 IMPLANT
KIT SUCTION CATH 14FR (SUCTIONS) ×6 IMPLANT
KIT TURNOVER KIT B (KITS) ×3 IMPLANT
KIT VASOVIEW HEMOPRO 2 VH 4000 (KITS) ×3 IMPLANT
MARKER GRAFT CORONARY BYPASS (MISCELLANEOUS) ×9 IMPLANT
NS IRRIG 1000ML POUR BTL (IV SOLUTION) ×15 IMPLANT
PACK E OPEN HEART (SUTURE) ×3 IMPLANT
PACK OPEN HEART (CUSTOM PROCEDURE TRAY) ×3 IMPLANT
PAD ARMBOARD 7.5X6 YLW CONV (MISCELLANEOUS) ×6 IMPLANT
PAD ELECT DEFIB RADIOL ZOLL (MISCELLANEOUS) ×3 IMPLANT
PENCIL BUTTON HOLSTER BLD 10FT (ELECTRODE) ×3 IMPLANT
POSITIONER HEAD DONUT 9IN (MISCELLANEOUS) ×3 IMPLANT
POUCH SELF SEAL 8IN X 16IN (STERILIZATION PRODUCTS) IMPLANT
PUNCH AORTIC ROTATE 4.0MM (MISCELLANEOUS) IMPLANT
PUNCH AORTIC ROTATE 4.5MM 8IN (MISCELLANEOUS) IMPLANT
PUNCH AORTIC ROTATE 5MM 8IN (MISCELLANEOUS) IMPLANT
SET MPS 3-ND DEL (MISCELLANEOUS) IMPLANT
SHEARS HARMONIC 9CM CVD (BLADE) ×3 IMPLANT
SOLUTION ANTFG W/FOAM PAD STRL (MISCELLANEOUS) IMPLANT
SPONGE T-LAP 18X18 ~~LOC~~+RFID (SPONGE) ×12 IMPLANT
SPONGE T-LAP 4X18 ~~LOC~~+RFID (SPONGE) ×3 IMPLANT
SUPPORT HEART JANKE-BARRON (MISCELLANEOUS) ×3 IMPLANT
SUT BONE WAX W31G (SUTURE) ×3 IMPLANT
SUT ETHIBOND 2 0 SH (SUTURE) ×3
SUT ETHIBOND 2 0 SH 36X2 (SUTURE) IMPLANT
SUT MNCRL AB 4-0 PS2 18 (SUTURE) IMPLANT
SUT PROLENE 3 0 SH DA (SUTURE) ×3 IMPLANT
SUT PROLENE 4 0 RB 1 (SUTURE) ×6
SUT PROLENE 4 0 SH DA (SUTURE) IMPLANT
SUT PROLENE 4-0 RB1 .5 CRCL 36 (SUTURE) IMPLANT
SUT PROLENE 5 0 C 1 36 (SUTURE) IMPLANT
SUT PROLENE 6 0 C 1 30 (SUTURE) ×6 IMPLANT
SUT PROLENE 7 0 BV1 MDA (SUTURE) ×3 IMPLANT
SUT PROLENE 8 0 BV175 6 (SUTURE) IMPLANT
SUT STEEL 6MS V (SUTURE) ×3 IMPLANT
SUT STEEL STERNAL CCS#1 18IN (SUTURE) IMPLANT
SUT STEEL SZ 6 DBL 3X14 BALL (SUTURE) ×3 IMPLANT
SUT VIC AB 1 CTX 27 (SUTURE) IMPLANT
SUT VIC AB 1 CTX 36 (SUTURE) ×6
SUT VIC AB 1 CTX36XBRD ANBCTR (SUTURE) ×6 IMPLANT
SUT VIC AB 2-0 CT1 27 (SUTURE) ×12
SUT VIC AB 2-0 CT1 TAPERPNT 27 (SUTURE) IMPLANT
SUT VIC AB 2-0 CTX 27 (SUTURE) IMPLANT
SUT VIC AB 3-0 SH 27 (SUTURE) ×3
SUT VIC AB 3-0 SH 27X BRD (SUTURE) IMPLANT
SUT VIC AB 3-0 X1 27 (SUTURE) IMPLANT
SUT VICRYL 4-0 PS2 18IN ABS (SUTURE) IMPLANT
SYR 50ML SLIP (SYRINGE) IMPLANT
SYSTEM SAHARA CHEST DRAIN ATS (WOUND CARE) ×3 IMPLANT
TAPE CLOTH SURG 4X10 WHT LF (GAUZE/BANDAGES/DRESSINGS) IMPLANT
TAPE PAPER 2X10 WHT MICROPORE (GAUZE/BANDAGES/DRESSINGS) IMPLANT
TOWEL GREEN STERILE (TOWEL DISPOSABLE) ×3 IMPLANT
TOWEL GREEN STERILE FF (TOWEL DISPOSABLE) ×3 IMPLANT
TRAY FOLEY SLVR 16FR TEMP STAT (SET/KITS/TRAYS/PACK) ×3 IMPLANT
TUBING LAP HI FLOW INSUFFLATIO (TUBING) ×3 IMPLANT
UNDERPAD 30X36 HEAVY ABSORB (UNDERPADS AND DIAPERS) ×3 IMPLANT
WATER STERILE IRR 1000ML POUR (IV SOLUTION) ×6 IMPLANT

## 2022-11-18 NOTE — Anesthesia Procedure Notes (Signed)
Arterial Line Insertion Start/End4/11/2022 7:05 AM, 11/18/2022 7:10 AM Performed by: Betha Loa, CRNA, CRNA  Patient location: Pre-op. Preanesthetic checklist: patient identified, IV checked, site marked, risks and benefits discussed, surgical consent, monitors and equipment checked, pre-op evaluation, timeout performed and anesthesia consent Lidocaine 1% used for infiltration and patient sedated Right, radial was placed Catheter size: 20 G Hand hygiene performed  and maximum sterile barriers used   Attempts: 1 Procedure performed without using ultrasound guided technique. Following insertion, Biopatch and dressing applied. Post procedure assessment: normal  Patient tolerated the procedure well with no immediate complications.

## 2022-11-18 NOTE — Anesthesia Preprocedure Evaluation (Addendum)
Anesthesia Evaluation  Patient identified by MRN, date of birth, ID band Patient awake    Reviewed: Allergy & Precautions, H&P , NPO status , Patient's Chart, lab work & pertinent test results  Airway Mallampati: IV  TM Distance: >3 FB Neck ROM: Full    Dental no notable dental hx. (+) Teeth Intact, Dental Advisory Given   Pulmonary neg pulmonary ROS   Pulmonary exam normal breath sounds clear to auscultation       Cardiovascular + angina  + CAD, + Past MI and + DOE   Rhythm:Regular Rate:Normal     Neuro/Psych   Anxiety Depression    negative neurological ROS     GI/Hepatic Neg liver ROS,GERD  ,,  Endo/Other  negative endocrine ROS    Renal/GU negative Renal ROS  negative genitourinary   Musculoskeletal  (+) Arthritis , Osteoarthritis,    Abdominal   Peds  Hematology negative hematology ROS (+)   Anesthesia Other Findings   Reproductive/Obstetrics negative OB ROS                             Anesthesia Physical Anesthesia Plan  ASA: 4  Anesthesia Plan: General   Post-op Pain Management: Tylenol PO (pre-op)*   Induction: Intravenous  PONV Risk Score and Plan: 3 and Ondansetron, Midazolam and Dexamethasone  Airway Management Planned: Oral ETT  Additional Equipment: Arterial line, CVP, Ultrasound Guidance Line Placement and TEE  Intra-op Plan:   Post-operative Plan: Post-operative intubation/ventilation  Informed Consent: I have reviewed the patients History and Physical, chart, labs and discussed the procedure including the risks, benefits and alternatives for the proposed anesthesia with the patient or authorized representative who has indicated his/her understanding and acceptance.     Dental advisory given  Plan Discussed with: CRNA  Anesthesia Plan Comments:        Anesthesia Quick Evaluation

## 2022-11-18 NOTE — Progress Notes (Signed)
  Echocardiogram Echocardiogram Transesophageal has been performed.  Bobbye Charleston 11/18/2022, 9:57 AM

## 2022-11-18 NOTE — Transfer of Care (Signed)
Immediate Anesthesia Transfer of Care Note  Patient: Christian Davis  Procedure(s) Performed: CORONARY ARTERY BYPASS GRAFTING (CABG) x THREE BYPASSES USING OPEN LEFT INTERNAL MAMMARY ARTERY AND OPEN HARVESTED RIGHT GREATER SAPHENOUS VEIN (Chest) ATTEMPTED RADIAL ARTERY HARVEST (Left) TRANSESOPHAGEAL ECHOCARDIOGRAM  Patient Location: SICU  Anesthesia Type:General  Level of Consciousness: sedated and Patient remains intubated per anesthesia plan  Airway & Oxygen Therapy: Patient remains intubated per anesthesia plan and Patient placed on Ventilator (see vital sign flow sheet for setting)  Post-op Assessment: Report given to RN and Post -op Vital signs reviewed and stable  Post vital signs: Reviewed and stable  Last Vitals:  Vitals Value Taken Time  BP 97/48   Temp 35.1 C 11/18/22 1358  Pulse 77 11/18/22 1358  Resp 16 11/18/22 1358  SpO2 97 % 11/18/22 1358  Vitals shown include unvalidated device data.  Last Pain:  Vitals:   11/18/22 0438  TempSrc: Oral  PainSc:          Complications: No notable events documented.

## 2022-11-18 NOTE — Anesthesia Postprocedure Evaluation (Signed)
Anesthesia Post Note  Patient: Christian Davis  Procedure(s) Performed: CORONARY ARTERY BYPASS GRAFTING (CABG) x THREE BYPASSES USING OPEN LEFT INTERNAL MAMMARY ARTERY AND OPEN HARVESTED RIGHT GREATER SAPHENOUS VEIN (Chest) ATTEMPTED RADIAL ARTERY HARVEST (Left) TRANSESOPHAGEAL ECHOCARDIOGRAM     Patient location during evaluation: SICU Anesthesia Type: General Level of consciousness: sedated Pain management: pain level controlled Vital Signs Assessment: post-procedure vital signs reviewed and stable Respiratory status: patient remains intubated per anesthesia plan Cardiovascular status: stable Postop Assessment: no apparent nausea or vomiting Anesthetic complications: no  No notable events documented.  Last Vitals:  Vitals:   11/18/22 1351 11/18/22 1352  BP:    Pulse: 80 80  Resp:    Temp:    SpO2: 100% 100%    Last Pain:  Vitals:   11/18/22 0438  TempSrc: Oral  PainSc:                  Delmont Prosch,W. EDMOND

## 2022-11-18 NOTE — Interval H&P Note (Signed)
History and Physical Interval Note:  11/18/2022 7:30 AM  Christian Davis  has presented today for surgery, with the diagnosis of CAD.  The various methods of treatment have been discussed with the patient and family. After consideration of risks, benefits and other options for treatment, the patient has consented to  Procedure(s): CORONARY ARTERY BYPASS GRAFTING (CABG) (N/A) RADIAL ARTERY HARVEST (Left) TRANSESOPHAGEAL ECHOCARDIOGRAM (N/A) as a surgical intervention.  The patient's history has been reviewed, patient examined, no change in status, stable for surgery.  I have reviewed the patient's chart and labs.  Questions were answered to the patient's satisfaction.     [name]

## 2022-11-18 NOTE — Brief Op Note (Signed)
11/15/2022 - 11/18/2022  2:05 PM  PATIENT:  Christian Davis  67 y.o. male  PRE-OPERATIVE DIAGNOSIS:  Coronary Artery Disease  POST-OPERATIVE DIAGNOSIS:  Coronary Artery Disease  PROCEDURE:   CORONARY ARTERY BYPASS GRAFTING (CABG) x THREE BYPASSES USING OPEN LEFT INTERNAL MAMMARY ARTERY AND ENDOSCOPICALLY HARVESTED GREATER SAPHENOUS VEIN OF THE RIGHT THIGH AND OPEN HARVEST OF RIGHT LOWER LEG GREATER SAPHENOUS VEIN -LIMA to LAD -SVG to DIAGONAL -SVG to OM Vein harvest time: 162min Vein prep time: 31min  TRANSESOPHAGEAL ECHOCARDIOGRAM   SURGEON:  Surgeon(s) and Role:    [name], MD - Primary  PHYSICIAN ASSISTANT: Wynelle Beckmann PA-C, Ellwood Handler PA-C  ASSISTANTS: Dr. Rob Hickman, Dineen Kid RNFA   ANESTHESIA:   general  EBL:  670 mL   BLOOD ADMINISTERED:none  DRAINS:  Mediastinal and pleural drains, RLE JP drain    LOCAL MEDICATIONS USED:  NONE  SPECIMEN:  No Specimen  DISPOSITION OF SPECIMEN:  N/A  COUNTS CORRECT:  YES  DICTATION: .Dragon Dictation  PLAN OF CARE: Admit to inpatient   PATIENT DISPOSITION:  ICU - intubated and hemodynamically stable.   Delay start of Pharmacological VTE agent (>24hrs) due to surgical blood loss or risk of bleeding: yes

## 2022-11-18 NOTE — Procedures (Signed)
Extubation Procedure Note  Patient Details:   Name: Christian Davis DOB: Sep 06, 1955 MRN: [id number]   Airway Documentation:    Vent end date: 11/18/22 Vent end time: 1840   Evaluation  O2 sats: stable throughout Complications: No apparent complications Patient did tolerate procedure well. Bilateral Breath Sounds: Clear   Yes  RT extubated patient to 4L [name] per MD order/rapid wean protocol with RN at bedside. Positive cuff leak noted. VC 930 NIF -28. No distress or stridor noted at this time.   Fabiola Backer 11/18/2022, 7:06 PM

## 2022-11-18 NOTE — Anesthesia Procedure Notes (Signed)
Central Venous Catheter Insertion Performed by: Roderic Palau, MD, anesthesiologist Start/End4/11/2022 6:50 AM, 11/18/2022 7:05 AM Patient location: Pre-op. Preanesthetic checklist: patient identified, IV checked, site marked, risks and benefits discussed, surgical consent, monitors and equipment checked, pre-op evaluation, timeout performed and anesthesia consent Hand hygiene performed  and maximum sterile barriers used  PA cath was placed.Swan type:thermodilution PA Cath depth:60 Procedure performed without using ultrasound guided technique. Attempts: 1 Patient tolerated the procedure well with no immediate complications.

## 2022-11-18 NOTE — Anesthesia Procedure Notes (Addendum)
Central Venous Catheter Insertion Performed by: Roderic Palau, MD, anesthesiologist Start/End4/11/2022 6:50 AM, 11/18/2022 7:05 AM Patient location: Pre-op. Preanesthetic checklist: patient identified, IV checked, site marked, risks and benefits discussed, surgical consent, monitors and equipment checked, pre-op evaluation, timeout performed and anesthesia consent Position: Trendelenburg Lidocaine 1% used for infiltration and patient sedated Hand hygiene performed , maximum sterile barriers used  and Seldinger technique used Catheter size: 9 Fr Total catheter length 10. Central line was placed.MAC introducer Procedure performed using ultrasound guided technique. Ultrasound Notes:anatomy identified, needle tip was noted to be adjacent to the nerve/plexus identified, no ultrasound evidence of intravascular and/or intraneural injection and image(s) printed for medical record Attempts: 1 Following insertion, line sutured, dressing applied and Biopatch. Post procedure assessment: blood return through all ports, free fluid flow and no air  Patient tolerated the procedure well with no immediate complications.

## 2022-11-19 ENCOUNTER — Inpatient Hospital Stay (HOSPITAL_COMMUNITY): Payer: Medicare Other

## 2022-11-19 ENCOUNTER — Encounter (HOSPITAL_COMMUNITY): Payer: Self-pay | Admitting: Thoracic Surgery (Cardiothoracic Vascular Surgery)

## 2022-11-19 DIAGNOSIS — Z951 Presence of aortocoronary bypass graft: Secondary | ICD-10-CM

## 2022-11-19 DIAGNOSIS — I214 Non-ST elevation (NSTEMI) myocardial infarction: Secondary | ICD-10-CM | POA: Diagnosis not present

## 2022-11-19 LAB — BASIC METABOLIC PANEL
Anion gap: 11 (ref 5–15)
Anion gap: 10 (ref 5–15)
BUN: 14 mg/dL (ref 8–23)
BUN: 12 mg/dL (ref 8–23)
CO2: 19 mmol/L — ABNORMAL LOW (ref 22–32)
CO2: 23 mmol/L (ref 22–32)
Calcium: 8 mg/dL — ABNORMAL LOW (ref 8.9–10.3)
Calcium: 8.2 mg/dL — ABNORMAL LOW (ref 8.9–10.3)
Chloride: 105 mmol/L (ref 98–111)
Chloride: 99 mmol/L (ref 98–111)
Creatinine, Ser: 0.84 mg/dL (ref 0.61–1.24)
Creatinine, Ser: 0.97 mg/dL (ref 0.61–1.24)
GFR, Estimated: >60 mL/min (ref >60)
GFR, Estimated: >60 mL/min (ref >60)
Glucose, Bld: 110 mg/dL — ABNORMAL HIGH (ref 70–99)
Glucose, Bld: 149 mg/dL — ABNORMAL HIGH (ref 70–99)
Potassium: 4.5 mmol/L (ref 3.5–5.1)
Potassium: 3.7 mmol/L (ref 3.5–5.1)
Sodium: 135 mmol/L (ref 135–145)
Sodium: 132 mmol/L — ABNORMAL LOW (ref 135–145)

## 2022-11-19 LAB — CBC
HCT: 33.5 % — ABNORMAL LOW (ref 39.0–52.0)
HCT: 34.1 % — ABNORMAL LOW (ref 39.0–52.0)
Hemoglobin: 11.3 g/dL — ABNORMAL LOW (ref 13.0–17.0)
Hemoglobin: 11.1 g/dL — ABNORMAL LOW (ref 13.0–17.0)
MCH: 32 pg (ref 26.0–34.0)
MCH: 31.7 pg (ref 26.0–34.0)
MCHC: 33.7 g/dL (ref 30.0–36.0)
MCHC: 32.6 g/dL (ref 30.0–36.0)
MCV: 94.9 fL (ref 80.0–100.0)
MCV: 97.4 fL (ref 80.0–100.0)
Platelets: 106 10*3/uL — ABNORMAL LOW (ref 150–400)
Platelets: 120 10*3/uL — ABNORMAL LOW (ref 150–400)
RBC: 3.53 MIL/uL — ABNORMAL LOW (ref 4.22–5.81)
RBC: 3.5 MIL/uL — ABNORMAL LOW (ref 4.22–5.81)
RDW: 12.3 % (ref 11.5–15.5)
RDW: 12.6 % (ref 11.5–15.5)
WBC: 13.3 10*3/uL — ABNORMAL HIGH (ref 4.0–10.5)
WBC: 15.8 10*3/uL — ABNORMAL HIGH (ref 4.0–10.5)
nRBC: 0 % (ref 0.0–0.2)
nRBC: 0 % (ref 0.0–0.2)

## 2022-11-19 LAB — GLUCOSE, CAPILLARY
Glucose-Capillary: 112 mg/dL — ABNORMAL HIGH (ref 70–99)
Glucose-Capillary: 112 mg/dL — ABNORMAL HIGH (ref 70–99)
Glucose-Capillary: 114 mg/dL — ABNORMAL HIGH (ref 70–99)
Glucose-Capillary: 117 mg/dL — ABNORMAL HIGH (ref 70–99)
Glucose-Capillary: 117 mg/dL — ABNORMAL HIGH (ref 70–99)
Glucose-Capillary: 123 mg/dL — ABNORMAL HIGH (ref 70–99)
Glucose-Capillary: 126 mg/dL — ABNORMAL HIGH (ref 70–99)
Glucose-Capillary: 92 mg/dL (ref 70–99)

## 2022-11-19 LAB — MAGNESIUM
Magnesium: 2.5 mg/dL — ABNORMAL HIGH (ref 1.7–2.4)
Magnesium: 2.3 mg/dL (ref 1.7–2.4)

## 2022-11-19 MED ORDER — CETIRIZINE HCL 10 MG PO TABS
10.0000 mg | ORAL_TABLET | Freq: Every day | ORAL | Status: DC
  Administered 2022-11-19 – 2022-11-22 (×4): 10 mg via ORAL
  Filled 2022-11-19 (×4): qty 1

## 2022-11-19 MED ORDER — ENOXAPARIN SODIUM 40 MG/0.4ML IJ SOSY
40.0000 mg | PREFILLED_SYRINGE | Freq: Every day | INTRAMUSCULAR | Status: DC
  Administered 2022-11-19 – 2022-11-20 (×2): 40 mg via SUBCUTANEOUS
  Filled 2022-11-19 (×2): qty 0.4

## 2022-11-19 MED ORDER — GABAPENTIN 100 MG PO CAPS
100.0000 mg | ORAL_CAPSULE | Freq: Every day | ORAL | Status: DC
  Administered 2022-11-19 – 2022-11-21 (×3): 100 mg via ORAL
  Filled 2022-11-19 (×3): qty 1

## 2022-11-19 MED ORDER — POTASSIUM CHLORIDE 10 MEQ/50ML IV SOLN
10.0000 meq | INTRAVENOUS | Status: AC
  Administered 2022-11-19 (×3): 10 meq via INTRAVENOUS
  Filled 2022-11-19 (×3): qty 50

## 2022-11-19 MED ORDER — METOPROLOL TARTRATE 25 MG PO TABS
25.0000 mg | ORAL_TABLET | Freq: Two times a day (BID) | ORAL | Status: DC
  Administered 2022-11-19 – 2022-11-21 (×6): 25 mg via ORAL
  Filled 2022-11-19 (×6): qty 1

## 2022-11-19 MED ORDER — METOPROLOL TARTRATE 25 MG/10 ML ORAL SUSPENSION
25.0000 mg | Freq: Two times a day (BID) | ORAL | Status: DC
  Filled 2022-11-19 (×4): qty 10

## 2022-11-19 MED ORDER — FUROSEMIDE 10 MG/ML IJ SOLN
40.0000 mg | Freq: Once | INTRAMUSCULAR | Status: AC
  Administered 2022-11-19: 40 mg via INTRAVENOUS
  Filled 2022-11-19: qty 4

## 2022-11-19 MED ORDER — INSULIN ASPART 100 UNIT/ML IJ SOLN
0.0000 [IU] | INTRAMUSCULAR | Status: DC
  Administered 2022-11-19 (×2): 2 [IU] via SUBCUTANEOUS

## 2022-11-19 NOTE — Progress Notes (Signed)
      [address]       [place] [address]             [phone]      POD # 1 Up in chair Feels well  BP 128/75   Pulse 84   Temp 98.1 F (36.7 C)   Resp 19   Ht 5\' 11"  (1.803 m)   Wt 122.1 kg   SpO2 98%   BMI 37.54 kg/m  2L [place] 96% sat  Intake/Output Summary (Last 24 hours) at 11/19/2022 1705 Last data filed at 11/19/2022 1616 Gross per 24 hour  Intake 2022.16 ml  Output 2717 ml  Net -694.84 ml  CBG well controlled  Doing well POD # 1  Koray Soter C. Dorris Fetch, MD [facility] [phone]

## 2022-11-19 NOTE — Discharge Instructions (Signed)
Discharge Instructions:  1. You may shower, please wash incisions daily with soap and water and keep dry.  If you wish to cover wounds with dressing you may do so but please keep clean and change daily.  No tub baths or swimming until incisions have completely healed.  If your incisions become red or develop any drainage please call our office at [phone]  2. No Driving until cleared by Dr. Sunday Corn office and you are no longer using narcotic pain medications  3. Monitor your weight daily.. Please use the same scale and weigh at same time... If you gain 5-10 lbs in 48 hours with associated lower extremity swelling, please contact our office at [phone]  4. Fever of 101.5 for at least 24 hours with no source, please contact our office at [phone]  5. Activity- up as tolerated, please walk at least 3 times per day.  Avoid strenuous activity, no lifting, pushing, or pulling with your arms over 8-10 lbs for a minimum of 6 weeks  6. If any questions or concerns arise, please do not hesitate to contact our office at [phone]     Right Radial Site Care after cardiac catheterization:   Refer to this sheet in the next few weeks. These instructions provide you with information on caring for yourself after your procedure. Your caregiver may also give you more specific instructions. Your treatment has been planned according to current medical practices, but problems sometimes occur. Call your caregiver if you have any problems or questions after your procedure.  HOME CARE INSTRUCTIONS You may shower the day after the procedure. Remove the bandage (dressing) and gently wash the site with plain soap and water. Gently pat the site dry.  Do not apply powder or lotion to the site.  Do not submerge the affected site in water for 3 to 5 days.  Inspect the site at least twice daily.  Do not flex or bend the affected arm for 24 hours.  No lifting over 5 pounds (2.3 kg) for 5 days after  your procedure.  Do not drive home if you are discharged the same day of the procedure. Have someone else drive you.  You may drive 24 hours after the procedure unless otherwise instructed by your caregiver.   What to expect: Any bruising will usually fade within 1 to 2 weeks.  Blood that collects in the tissue (hematoma) may be painful to the touch. It should usually decrease in size and tenderness within 1 to 2 weeks.   SEEK IMMEDIATE MEDICAL CARE IF: You have unusual pain at the radial site.  You have redness, warmth, swelling, or pain at the radial site.  You have drainage (other than a small amount of blood on the dressing).  You have chills.  You have a fever or persistent symptoms for more than 72 hours.  You have a fever and your symptoms suddenly get worse.  Your arm becomes pale, cool, tingly, or numb.  You have heavy bleeding from the site. Hold pressure on the site.      HEART FAILURE INSTRUCTIONS   Follow a low salt diet which means you need to consume less than 2 grams (2000mg ) of sodium per day.  Check the labels!  You'll be surprised to see how much sodium is in common foods and drinks.  DO NOT EAT foods high in salt, such as salted nuts, crackers, smoked fish, fast food, deli meats, salami, pepperoni, jerky, prosciutto, ham, bacon, soups, prepared frozen dinners.  DO  NOT add salt to your food when cooking or at the table.  It is ok to use other seasonings and flavors as long as they are salt free.    Drink less than 8 cups (which is ~205600ml or 2 liters) of fluid per day.  This is equivalent to ~ 4 bottles of water a day.  Weigh yourself every morning before breakfast and write it down in a log.  Bring in your record to every clinic visit.  Check for swelling in your feet, ankles, legs and stomach every morning.  Take an extra dose of your fluid pill once a day for worsening shortness of breath, swelling, or >3lb weight gain in 24 hours or >5lb weight gain in 1  week.  Control your blood pressure.  If you have a blood pressure cuff, record your blood pressure & heart rate daily. Bring the record to [facility] clinic with you.  Goal BP is <140/90  Control your blood sugars if you are diabetic  Control your cholesterol.  The goal LDL (bad cholesterol) is <70  Lose weight if you are overweight  Exercise as much as you are able to strengthen your heart  Take your medicine the way you are instructed   Bring ALL your medicines and medication list to [facility]   Get a yearly flu shot to reduce your risk of the flu   If you are under age 66 you need the Pneumovax pneumonia vaccine as a one time shot to reduce the risk of pneumonia   If you are 8765 yrs or older you need the Prevnar pneumonia vaccine as a one time shot followed by a Pneumovax pneumonia vaccine > 1 year later as a one time shot to help reduce your risk of pneumonia   Call nurse if you have trouble paying for your medications, are running low on pills, having trouble with transportation or if you are gaining weight quickly, having significant swelling, trouble breathing, chest pain, dizziness or fainting spells.

## 2022-11-19 NOTE — Progress Notes (Signed)
1 Day Post-Op Procedure(s) (LRB): CORONARY ARTERY BYPASS GRAFTING (CABG) x THREE BYPASSES USING OPEN LEFT INTERNAL MAMMARY ARTERY AND OPEN HARVESTED RIGHT GREATER SAPHENOUS VEIN (N/A) ATTEMPTED RADIAL ARTERY HARVEST (Left) TRANSESOPHAGEAL ECHOCARDIOGRAM (N/A) Subjective: Some incisional pain, nausea last night, none this AM  Objective: Vital signs in last 24 hours: Temp:  [94.6 F (34.8 C)-101.1 F (38.4 C)] 99.1 F (37.3 C) (04/05 0615) Pulse Rate:  [70-188] 77 (04/05 0630) Cardiac Rhythm: Normal sinus rhythm;Atrial paced (04/05 0000) Resp:  [0-26] 26 (04/05 0630) BP: (74-133)/(58-82) 133/76 (04/05 0615) SpO2:  [42 %-100 %] 97 % (04/05 0630) Arterial Line BP: (73-180)/(46-85) 142/60 (04/05 0630) FiO2 (%):  [40 %-50 %] 40 % (04/04 1757) Weight:  [122.1 kg] 122.1 kg (04/05 0630)  Hemodynamic parameters for last 24 hours: PAP: (19-71)/(9-49) 71/49 CVP:  [7 mmHg-25 mmHg] 11 mmHg PCWP:  [16 mmHg-23 mmHg] 23 mmHg CO:  [3.4 L/min-5.6 L/min] 5.6 L/min CI:  [1.5 L/min/m2-2.4 L/min/m2] 2.4 L/min/m2  Intake/Output from previous day: 04/04 0701 - 04/05 0700 In: 6393.6 [I.V.:3550.2; Blood:480; IV Piggyback:2363.4] Out: 4567 [Urine:3237; Drains:80; Blood:670; Chest Tube:580] Intake/Output this shift: No intake/output data recorded.  General appearance: alert, cooperative, and no distress Neurologic: intact Heart: regular rate and rhythm Lungs: diminished breath sounds bibasilar Abdomen: normal findings: soft, non-tender  Lab Results: Recent Labs    11/18/22 1955 11/18/22 1956 11/19/22 0346  WBC 16.9*  --  13.3*  HGB 11.9* 12.2* 11.3*  HCT 35.5* 36.0* 33.5*  PLT 159  --  106*   BMET:  Recent Labs    11/18/22 1955 11/18/22 1956 11/19/22 0346  NA 136 137 135  K 4.5 4.6 4.5  CL 106  --  105  CO2 22  --  19*  GLUCOSE 132*  --  110*  BUN 13  --  14  CREATININE 0.84  --  0.84  CALCIUM 7.7*  --  8.0*    PT/INR:  Recent Labs    11/18/22 1413  LABPROT 17.5*  INR 1.5*    ABG    Component Value Date/Time   PHART 7.299 (L) 11/18/2022 1956   HCO3 20.0 11/18/2022 1956   TCO2 21 (L) 11/18/2022 1956   ACIDBASEDEF 6.0 (H) 11/18/2022 1956   O2SAT 97 11/18/2022 1956   CBG (last 3)  Recent Labs    11/19/22 0006 11/19/22 0122 11/19/22 0343  GLUCAP 112* 112* 114*    Assessment/Plan: S/P Procedure(s) (LRB): CORONARY ARTERY BYPASS GRAFTING (CABG) x THREE BYPASSES USING OPEN LEFT INTERNAL MAMMARY ARTERY AND OPEN HARVESTED RIGHT GREATER SAPHENOUS VEIN (N/A) ATTEMPTED RADIAL ARTERY HARVEST (Left) TRANSESOPHAGEAL ECHOCARDIOGRAM (N/A) POD # 1 NEURO- intact CV- in SR with good hemodynamics  Dc Swan, A line  ASA, statin, beta blocker RESP- IS for basilar atelectasis RENAL- creatinine normal  Fluid overload- diurese ENDO- CBG well controlled  SSI q4 GI- advance diet as tolerated Thrombocytopenia- PLT 106K SCD + enoxaparin for DVT prophylaxis Cardiac rehab   LOS: 4 days    Loreli Slot 11/19/2022

## 2022-11-19 NOTE — Hospital Course (Addendum)
History of Present Illness:   [name] is a 67 year old male with a history of hyperlipidemia, GERD, alcohol abuse, GAD with night terrors and reported pulmonary asbestosis [place] [facility] [place] CT imaging. Over the past few months he reports intermittent chest pain and pressure with exertion that has been worsening over the past couple of weeks. He states the pain usually starts in his jaw and then his chest. His last chest CT in May showed stable moderate coronary artery calcification. He underwent an outpatient nuclear stress test on 11/15/22 that demonstrated borderline ST abnormalities. After the stress test he felt like someone had punched him in the chest, this lasted a few seconds and then symptoms completely resolved. He states this is the worst chest pain he has ever had. He admits to associated shortness of breath when he has chest pain but denies diaphoresis, dizziness, LOC or nausea and vomiting. He denies symptoms at rest. Upon arrival to the ED, EKG showed nonspecific ST changes and high sensitivity Troponin I elevated from 9 to 36. He was diagnosed with NSTEMI vs unstable angina. Cardiac catheterization on 11/16/22 showed severe 2 vessel CAD with sequential 50-70% ostial through mid LAD stenosis and chronic total occlusion of the dominant left circumflex with left to left and right to left collaterals. It also showed mildly reduced left ventricular systolic function with an LVEF 45-50% and global hypokinesis as well as mild to moderaly elevated left heart filling pressure LVEDP 20-5625mmHg, low dose diuresis was started. Echocardiogram was pending.    He retired 10 years ago, but worked in a [employer] which is where he was exposed to asbestos and states asbestosis is what killed his father. He was walking a couple miles per day but stopped about a year ago, wife states he now has had to stop and rest due to chest discomfort and shortness of breath while walking  [place]. He currently denies chest pain or shortness of breath and is resting comfortably in bed. Him and his wife live together in a 1 story house. He does admit to drinking 2-3 drinks daily.  Dr. Dorris FetchHendrickson reviewed the patient's diagnostic studies and determined he would benefit from coronary artery bypass grafting surgery. He reviewed the patient's treatment options as well as the risks and benefits of surgery. [name] was agreeable to proceed with surgery.   Hospital Course:  [name] was [facility] and remained in stable condition until he was brought to the operating room on 11/18/22. He underwent CABG x 3 utilizing LIMA to LAD, SVG to OM and SVG to Diagonal. The left forearm was opened to harvest the radial artery but it was not harvested due to its small size. He also underwent endoscopic harvest of the right thigh and open harvest of the right lower leg. He tolerated the procedure well and was transferred to the SICU in stable condition. He was extubated the evening of surgery without complication. His drips were weaned as hemodynamics tolerated. His arterial line and swan ganz catheter were removed on POD1. Chest tubes remained in place due to moderate drainage. He was saturating well on 2L [place]. Low dose Lopressor was started and titrated as able. He was volume overload, diuresis was started. The patient's chest tubes were removed without difficulty. He was felt stable for transfer to 4E on 11/20/2022. The patient remained in NSR. He had mild hypertension and was started on Cozaar to assist with management. Mild  hypertension persisted, Lopressor was titrated. His pacing wires were removed without difficulty. He was started on Plavix due to his NSTEMI. He was ambulating without difficulty. His surgical incisions were healing without evidence of infection. He was felt stable for discharge to home.

## 2022-11-19 NOTE — Discharge Summary (Signed)
[address]       [place] [id number]             [phone]    Physician Discharge Summary  Patient ID: Christian Listeneter P Sarate MRN: [id number] DOB/AGE: 1956-06-21 67 y.o.  Admit date: 11/15/2022 Discharge date: 11/22/2022  Admission Diagnoses:  Patient Active Problem List   Diagnosis Date Noted   Unstable angina 11/16/2022   Abnormal stress test 11/16/2022   Non-ST elevation (NSTEMI) myocardial infarction 11/16/2022   Hyperlipidemia 11/16/2022   Chest pain 11/15/2022   Alcohol abuse 11/11/2022   Chest pressure 11/11/2022   DOE (dyspnea on exertion) 11/11/2022   Asbestosis 02/08/2020   Generalized anxiety disorder 04/25/2017   Night terrors, adult 02/20/2016   Short-term memory loss 02/20/2016   Chronic nasal congestion 02/11/2016   Essential tremor 02/11/2016     Discharge Diagnoses:  Patient Active Problem List   Diagnosis Date Noted   S/P CABG x 3 11/18/2022   Unstable angina 11/16/2022   Abnormal stress test 11/16/2022   Non-ST elevation (NSTEMI) myocardial infarction 11/16/2022   Hyperlipidemia 11/16/2022   Chest pain 11/15/2022   Alcohol abuse 11/11/2022   Chest pressure 11/11/2022   DOE (dyspnea on exertion) 11/11/2022   Asbestosis 02/08/2020   Generalized anxiety disorder 04/25/2017   Night terrors, adult 02/20/2016   Short-term memory loss 02/20/2016   Chronic nasal congestion 02/11/2016   Essential tremor 02/11/2016     Discharged Condition: stable  History of Present Illness:   [name] is a 67 year old male with a history of hyperlipidemia, GERD, alcohol abuse, GAD with night terrors and reported pulmonary asbestosis followed by [facility] in [place] with serial CT imaging. Over the past few months he reports intermittent chest pain and pressure with exertion that has been worsening over the past couple of weeks. He states the pain usually starts in his jaw and then his chest. His last chest CT in May showed stable moderate  coronary artery calcification. He underwent an outpatient nuclear stress test on 11/15/22 that demonstrated borderline ST abnormalities. After the stress test he felt like someone had punched him in the chest, this lasted a few seconds and then symptoms completely resolved. He states this is the worst chest pain he has ever had. He admits to associated shortness of breath when he has chest pain but denies diaphoresis, dizziness, LOC or nausea and vomiting. He denies symptoms at rest. Upon arrival to the ED, EKG showed nonspecific ST changes and high sensitivity Troponin I elevated from 9 to 36. He was diagnosed with NSTEMI vs unstable angina. Cardiac catheterization on 11/16/22 showed severe 2 vessel CAD with sequential 50-70% ostial through mid LAD stenosis and chronic total occlusion of the dominant left circumflex with left to left and right to left collaterals. It also showed mildly reduced left ventricular systolic function with an LVEF 45-50% and global hypokinesis as well as mild to moderaly elevated left heart filling pressure LVEDP 20-5525mmHg, low dose diuresis was started. Echocardiogram was pending.    He retired 10 years ago, but worked in a [employer] which is where he was exposed to asbestos and states asbestosis is what killed his father. He was walking a couple miles per day but stopped about a year ago, wife states he now has had to stop and rest due to chest discomfort and shortness of breath while [place] the airport. He currently denies chest pain or shortness of breath and is resting  comfortably in bed. Him and his wife live together in a 1 story house. He does admit to drinking 2-3 drinks daily.  Dr. Dorris FetchHendrickson reviewed the patient's diagnostic studies and determined he would benefit from coronary artery bypass grafting surgery. He reviewed the patient's treatment options as well as the risks and benefits of surgery. [name] was agreeable to proceed with surgery.   Hospital  Course:  [name] was [facility] and remained in stable condition until he was brought to the operating room on 11/18/22. He underwent CABG x 3 utilizing LIMA to LAD, SVG to OM and SVG to Diagonal. The left forearm was opened to harvest the radial artery but it was not harvested due to its small size. He also underwent endoscopic harvest of the right thigh and open harvest of the right lower leg. He tolerated the procedure well and was transferred to the SICU in stable condition. He was extubated the evening of surgery without complication. His drips were weaned as hemodynamics tolerated. His arterial line and swan ganz catheter were removed on POD1. Chest tubes remained in place due to moderate drainage. He was saturating well on 2L [place]. Low dose Lopressor was started and titrated as able. He was volume overload, diuresis was started. The patient's chest tubes were removed without difficulty. He was felt stable for transfer to 4E on 11/20/2022. The patient remained in NSR. He had mild hypertension and was started on Cozaar to assist with management. Mild hypertension persisted, Lopressor was titrated. His pacing wires were removed without difficulty. He was started on Plavix due to his NSTEMI. He was ambulating without difficulty. His surgical incisions were healing without evidence of infection. He was felt stable for discharge to home.   Consults: None  Significant Diagnostic Studies:  INTRAVASCULAR PRESSURE WIRE/FFR STUDY  LEFT HEART CATH AND CORONARY ANGIOGRAPHY   Conclusion  Conclusions: Severe two-vessel coronary artery disease with sequential 50-70% ostial through mid LAD stenoses that are hemodynamically significant by RFR and chronic total occlusion of dominant LCx with left-to-left and right-to-left collaterals. Mildly reduced left ventricular systolic function (LVEF 45-50%) with global hypokinesis. Mild to moderately elevated left heart filling pressure (LVEDP 20-25  mmHg).   ECHOCARDIOGRAM REPORT    Patient Name:   Christian Davis Date of Exam: 11/16/2022 Medical Rec #:  [id number]      Height:       71.0 in Accession #:    [id number]     Weight:       250.2 lb Date of Birth:  Apr 30, 1956      BSA:          2.319 m Patient Age:    66 years       BP:           156/84 mmHg Patient Gender: M              HR:           70 bpm. Exam Location:  Inpatient  Procedure: 2D Echo, Cardiac Doppler, Color Doppler and Intracardiac            Opacification Agent  Indications:    Chest Pain R07.9   History:        Patient has no prior history of Echocardiogram examinations.                 Signs/Symptoms:Chest Pain and Dyspnea.   Sonographer:    Lucendia HerrlichShanika Turnbull Referring Phys: [id number] CADENCE H FURTH  IMPRESSIONS  1. Left ventricular ejection fraction, by estimation, is 60 to 65%. The left ventricle has normal function. Left ventricular endocardial border not optimally defined to evaluate regional wall motion. Left ventricular diastolic parameters were normal.  2. Right ventricular systolic function is normal. The right ventricular size is normal.  3. The mitral valve was not well visualized. No evidence of mitral valve regurgitation. No evidence of mitral stenosis.  4. The aortic valve is tricuspid. Aortic valve regurgitation is not visualized. No aortic stenosis is present.  Comparison(s): No prior Echocardiogram.  FINDINGS  Left Ventricle: Left ventricular ejection fraction, by estimation, is 60 to 65%. The left ventricle has normal function. Left ventricular endocardial border not optimally defined to evaluate regional wall motion. Definity contrast agent was given IV to delineate the left ventricular endocardial borders. The left ventricular internal cavity size was normal in size. Suboptimal image quality limits for assessment of left ventricular hypertrophy. Left ventricular diastolic parameters were normal.  Right Ventricle: The right  ventricular size is normal. No increase in right ventricular wall thickness. Right ventricular systolic function is normal.  Left Atrium: Left atrial size was normal in size.  Right Atrium: Right atrial size was normal in size.  Pericardium: There is no evidence of pericardial effusion.  Mitral Valve: The mitral valve was not well visualized. No evidence of mitral valve regurgitation. No evidence of mitral valve stenosis.  Tricuspid Valve: The tricuspid valve is normal in structure. Tricuspid valve regurgitation is not demonstrated. No evidence of tricuspid stenosis.  Aortic Valve: The aortic valve is tricuspid. Aortic valve regurgitation is not visualized. No aortic stenosis is present. Aortic valve mean gradient measures 3.0 mmHg. Aortic valve peak gradient measures 5.9 mmHg. Aortic valve area, by VTI measures 3.07 cm.  Pulmonic Valve: The pulmonic valve was normal in structure. Pulmonic valve regurgitation is not visualized. No evidence of pulmonic stenosis.  Aorta: The aortic root and ascending aorta are structurally normal, with no evidence of dilitation.  IAS/Shunts: No atrial level shunt detected by color flow Doppler.  LEFT VENTRICLE PLAX 2D LVOT diam:     2.10 cm     Diastology LV SV:         72          LV e' medial:    10.40 cm/s LV SV Index:   31          LV E/e' medial:  8.8 LVOT Area:     3.46 cm    LV e' lateral:   11.60 cm/s                            LV E/e' lateral: 7.9   LV Volumes (MOD) LV vol d, MOD A4C: 99.1 ml LV vol s, MOD A4C: 35.9 ml LV SV MOD A4C:     99.1 ml  RIGHT VENTRICLE RV S prime:     11.70 cm/s TAPSE (M-mode): 1.7 cm  AORTIC VALVE AV Area (Vmax):    3.07 cm AV Area (Vmean):   2.96 cm AV Area (VTI):     3.07 cm AV Vmax:           121.00 cm/s AV Vmean:          82.100 cm/s AV VTI:            0.236 m AV Peak Grad:      5.9 mmHg AV Mean Grad:      3.0 mmHg LVOT Vmax:  107.33 cm/s LVOT Vmean:        70.200 cm/s LVOT  VTI:          0.209 m LVOT/AV VTI ratio: 0.89   AORTA Ao Root diam: 3.50 cm Ao Asc diam:  2.70 cm  MITRAL VALVE MV Area (PHT): 3.72 cm    SHUNTS MV Decel Time: 204 msec    Systemic VTI:  0.21 m MV E velocity: 91.90 cm/s  Systemic Diam: 2.10 cm MV A velocity: 92.40 cm/s MV E/A ratio:  0.99  Riley Lam MD Electronically signed by Riley Lam MD Signature Date/Time: 11/16/2022/4:42:45 PM    Final     Treatments: surgery:  Operative Report    DATE OF PROCEDURE: 11/18/2022   PREOPERATIVE DIAGNOSIS:  Severe 2-vessel coronary artery disease.   POSTOPERATIVE DIAGNOSIS:  Severe 2-vessel coronary artery disease.   PROCEDURES PERFORMED:  Median sternotomy, extracorporeal circulation, coronary artery bypass grafting x3 (left internal mammary artery to LAD, saphenous vein graft to first diagonal, saphenous vein graft to first obtuse marginal), endoscopic vein  harvest, right thigh, open vein harvest, right lower leg.  Discharge Exam: Blood pressure (!) 146/80, pulse 79, temperature 98.2 F (36.8 C), temperature source Oral, resp. rate 20, height 5\' 11"  (1.803 m), weight 117.3 kg, SpO2 97 %. General appearance: alert, cooperative, and no distress Neurologic: intact Heart: Regular rate and rhythm, PVCs, no murmur Lungs: Slightly diminished left basilar breath sounds Abdomen: soft, non-tender; bowel sounds normal; no masses,  no organomegaly Extremities: edema trace Wound: Clean and dry, no erythema or sign of infection. Ecchymosis surrounding sternal and right leg incisions.   Discharge Medications:  The patient has been discharged on:   1.Beta Blocker:  Yes [ X  ]                              No   [   ]                              If No, reason:  2.Ace Inhibitor/ARB: Yes [  X ]                                     No  [    ]                                     If No, reason:  3.Statin:   Yes [  X ]                  No  [   ]                  If No,  reason:  4.Ecasa:  Yes  [  X ]                  No   [   ]                  If No, reason:  Patient had ACS upon admission: Yes  Plavix/P2Y12 inhibitor: Yes [ X  ]  No  [   ]     Discharge Instructions     Amb Referral to Cardiac Rehabilitation   Complete by: As directed    Diagnosis: CABG   CABG X ___: 3   After initial evaluation and assessments completed: Virtual Based Care may be provided alone or in conjunction with Phase 2 Cardiac Rehab based on patient barriers.: Yes   Intensive Cardiac Rehabilitation (ICR) [facility] location only OR Traditional Cardiac Rehabilitation (TCR) *If criteria for ICR are not met will enroll in TCR [facility] only): Yes      Allergies as of 11/22/2022       Reactions   Codeine Anxiety        Medication List     STOP taking these medications    acetaminophen 650 MG CR tablet Commonly known as: TYLENOL Replaced by: acetaminophen 500 MG tablet   omeprazole 40 MG capsule Commonly known as: PRILOSEC       TAKE these medications    acetaminophen 500 MG tablet Commonly known as: TYLENOL Take 2 tablets (1,000 mg total) by mouth every 6 (six) hours as needed. Replaces: acetaminophen 650 MG CR tablet   aspirin EC 81 MG tablet Take 1 tablet (81 mg total) by mouth daily. Swallow whole.   azelastine 0.1 % nasal spray Commonly known as: ASTELIN Place 2 sprays into both nostrils at bedtime. Use in each nostril as directed   BLUE-EMU MAXIMUM STRENGTH EX Apply 1 Application topically daily as needed (pain).   cholecalciferol 25 MCG (1000 UNIT) tablet Commonly known as: VITAMIN D3 Take 1,000 Units by mouth daily.   citalopram 20 MG tablet Commonly known as: CELEXA Take 20 mg by mouth daily.   clopidogrel 75 MG tablet Commonly known as: PLAVIX Take 1 tablet (75 mg total) by mouth daily.   cyanocobalamin 1000 MCG tablet Commonly known as: VITAMIN B12 Take 1,000 mcg by mouth daily.   furosemide 40 MG  tablet Commonly known as: LASIX Take 1 tablet (40 mg total) by mouth daily.   gabapentin 100 MG capsule Commonly known as: NEURONTIN Take 100 mg by mouth at bedtime.   levocetirizine 5 MG tablet Commonly known as: XYZAL Take 5 mg by mouth daily.   losartan 25 MG tablet Commonly known as: COZAAR Take 1 tablet (25 mg total) by mouth daily.   meloxicam 7.5 MG tablet Commonly known as: MOBIC Take 1 tablet (7.5 mg total) by mouth daily as needed for pain. Start taking on: Dec 20, 2022 What changed: These instructions start on Dec 20, 2022. If you are unsure what to do until then, ask your doctor or other care provider.   Metoprolol Tartrate 37.5 MG Tabs Take 1 tablet (37.5 mg total) by mouth 2 (two) times daily.   potassium chloride SA 20 MEQ tablet Commonly known as: KLOR-CON M Take 1 tablet (20 mEq total) by mouth daily.   Prevagen Extra Strength 20 MG Caps Generic drug: Apoaequorin Take 20 mg by mouth daily.   rosuvastatin 40 MG tablet Commonly known as: CRESTOR Take 1 tablet (40 mg total) by mouth daily.   traMADol 50 MG tablet Commonly known as: ULTRAM Take 1 tablet (50 mg total) by mouth every 6 (six) hours as needed for moderate pain. What changed:  when to take this reasons to take this               Durable Medical Equipment  (From admission, onward)           Start  Ordered   11/21/22 1431  For home use only DME 4 wheeled rolling walker with seat  Once       Question:  Patient needs a walker to treat with the following condition  Answer:  Weakness   11/21/22 1430            Follow-up Information     Loreli Slot, MD Follow up on 12/21/2022.   Specialty: Cardiothoracic Surgery Why: Follow up appointment is at 11:45AM Contact information: [address] [employer] [address] [place] [address] [phone]         [facility] Follow up on 12/21/2022.   Why: To get chest xray at 10:45AM Contact information: [address] [place] [address]                Signed:  Jenny Reichmann, PA-C  11/22/2022, 12:40 PM

## 2022-11-19 NOTE — Progress Notes (Signed)
Cardiology Progress Note  Patient ID: KAZ RISHER MRN: [id number] DOB: 17-Oct-1955 Date of Encounter: 11/19/2022  Primary Cardiologist: Marjo Bicker, MD  Subjective   Chief Complaint: Chest pain  HPI: Status post CABG.  Discomfort at sternotomy site.  Hemodynamically stable.  CT surgery with plans to remove lines and drains.  ROS:  All other ROS reviewed and negative. Pertinent positives noted in the HPI.     Inpatient Medications  Scheduled Meds:  acetaminophen  1,000 mg Oral Q6H   Or   acetaminophen (TYLENOL) oral liquid 160 mg/5 mL  1,000 mg Per Tube Q6H   aspirin EC  325 mg Oral Daily   Or   aspirin  324 mg Per Tube Daily   bisacodyl  10 mg Oral Daily   Or   bisacodyl  10 mg Rectal Daily   cetirizine  10 mg Oral Daily   Chlorhexidine Gluconate Cloth  6 each Topical Daily   citalopram  20 mg Oral QHS   docusate sodium  200 mg Oral Daily   enoxaparin (LOVENOX) injection  40 mg Subcutaneous QHS   furosemide  40 mg Intravenous Once   gabapentin  100 mg Oral QHS   insulin aspart  0-24 Units Subcutaneous Q4H   metoprolol tartrate  25 mg Oral BID   Or   metoprolol tartrate  25 mg Per Tube BID   [START ON 11/20/2022] pantoprazole  40 mg Oral Daily   rosuvastatin  40 mg Oral Daily   sodium chloride flush  3 mL Intravenous Q12H   Continuous Infusions:  sodium chloride 20 mL/hr at 11/19/22 0600   sodium chloride     sodium chloride 10 mL/hr at 11/19/22 0600   albumin human 60 mL/hr at 11/19/22 0600    ceFAZolin (ANCEF) IV Stopped (11/19/22 0528)   dexmedetomidine (PRECEDEX) IV infusion Stopped (11/18/22 1715)   lactated ringers     lactated ringers 20 mL/hr at 11/19/22 0600   lactated ringers     nitroGLYCERIN 10 mcg/min (11/19/22 0600)   phenylephrine (NEO-SYNEPHRINE) Adult infusion Stopped (11/19/22 0027)   PRN Meds: sodium chloride, albumin human, lactated ringers, metoprolol tartrate, midazolam, morphine injection, ondansetron (ZOFRAN) IV, oxyCODONE, sodium  chloride flush, traMADol   Vital Signs   Vitals:   11/19/22 0545 11/19/22 0600 11/19/22 0615 11/19/22 0630  BP:   133/76   Pulse: 76 78 79 77  Resp: 19 (!) 22 (!) 22 (!) 26  Temp: 99 F (37.2 C) 99 F (37.2 C) 99.1 F (37.3 C)   TempSrc:      SpO2: 96% 98% 96% 97%  Weight:    122.1 kg  Height:        Intake/Output Summary (Last 24 hours) at 11/19/2022 0942 Last data filed at 11/19/2022 0659 Gross per 24 hour  Intake 5193.61 ml  Output 4467 ml  Net 726.61 ml      11/19/2022    6:30 AM 11/18/2022    4:38 AM 11/17/2022    8:53 AM  Last 3 Weights  Weight (lbs) 269 lb 2.9 oz 252 lb 6.8 oz 254 lb 14.4 oz  Weight (kg) 122.1 kg 114.5 kg 115.622 kg      Telemetry  Overnight telemetry shows sinus rhythm 80s, which I personally reviewed.   ECG  The most recent ECG shows sinus rhythm heart rate 81, nonspecific ST-T changes, which I personally reviewed.   Physical Exam   Vitals:   11/19/22 0545 11/19/22 0600 11/19/22 0615 11/19/22 0630  BP:  133/76   Pulse: 76 78 79 77  Resp: 19 (!) 22 (!) 22 (!) 26  Temp: 99 F (37.2 C) 99 F (37.2 C) 99.1 F (37.3 C)   TempSrc:      SpO2: 96% 98% 96% 97%  Weight:    122.1 kg  Height:        Intake/Output Summary (Last 24 hours) at 11/19/2022 0942 Last data filed at 11/19/2022 0659 Gross per 24 hour  Intake 5193.61 ml  Output 4467 ml  Net 726.61 ml       11/19/2022    6:30 AM 11/18/2022    4:38 AM 11/17/2022    8:53 AM  Last 3 Weights  Weight (lbs) 269 lb 2.9 oz 252 lb 6.8 oz 254 lb 14.4 oz  Weight (kg) 122.1 kg 114.5 kg 115.622 kg    Body mass index is 37.54 kg/m.  General: Well nourished, well developed, in no acute distress Head: Atraumatic, normal size  Eyes: PEERLA, EOMI  Neck: Supple, JVD 8 to 10 cm of water Endocrine: No thryomegaly Cardiac: Normal S1, S2; RRR; no murmurs, rubs, or gallops Lungs: Crackles at the lung bases Abd: Soft, nontender, no hepatomegaly  Ext: No edema, pulses 2+ Musculoskeletal: No deformities, BUE  and BLE strength normal and equal Skin: Midline sternotomy scar clean and dry without evidence of infection Neuro: Alert and oriented to person, place, time, and situation, CNII-XII grossly intact, no focal deficits  Psych: Normal mood and affect   Labs  High Sensitivity Troponin:   Recent Labs  Lab 11/15/22 1108 11/15/22 1312  TROPONINIHS 9 36*     Cardiac EnzymesNo results for input(s): "TROPONINI" in the last 168 hours. No results for input(s): "TROPIPOC" in the last 168 hours.  Chemistry Recent Labs  Lab 11/18/22 0102 11/18/22 0757 11/18/22 1252 11/18/22 1417 11/18/22 1955 11/18/22 1956 11/19/22 0346  NA 133*   < > 133*   < > 136 137 135  K 4.0   < > 5.2*   < > 4.5 4.6 4.5  CL 102   < > 103  --  106  --  105  CO2 25  --   --   --  22  --  19*  GLUCOSE 96   < > 111*  --  132*  --  110*  BUN 18   < > 13  --  13  --  14  CREATININE 0.92   < > 0.50*  --  0.84  --  0.84  CALCIUM 8.8*  --   --   --  7.7*  --  8.0*  GFRNONAA >60  --   --   --  >60  --  >60  ANIONGAP 6  --   --   --  8  --  11   < > = values in this interval not displayed.    Hematology Recent Labs  Lab 11/18/22 1413 11/18/22 1417 11/18/22 1955 11/18/22 1956 11/19/22 0346  WBC 13.4*  --  16.9*  --  13.3*  RBC 3.58*  --  3.72*  --  3.53*  HGB 11.5*   < > 11.9* 12.2* 11.3*  HCT 34.0*   < > 35.5* 36.0* 33.5*  MCV 95.0  --  95.4  --  94.9  MCH 32.1  --  32.0  --  32.0  MCHC 33.8  --  33.5  --  33.7  RDW 12.1  --  12.3  --  12.3  PLT 88*  --  159  --  106*   < > = values in this interval not displayed.   BNPNo results for input(s): "BNP", "PROBNP" in the last 168 hours.  DDimer No results for input(s): "DDIMER" in the last 168 hours.   Radiology  DG Chest Port 1 View  Result Date: 11/19/2022 CLINICAL DATA:  CABG EXAM: PORTABLE CHEST 1 VIEW COMPARISON:  Yesterday FINDINGS: Interval extubation. Lower volume lungs with increased atelectasis. Chest tubes remain in place. Right IJ sheath with Swan-Ganz  catheter, tip at the main pulmonary artery. Cardiomegaly. Unchanged mediastinal contours. Status post CABG. No visible pneumothorax. IMPRESSION: Increased atelectasis after extubation. No visible pneumothorax. Electronically Signed   By: Tiburcio Pea M.D.   On: 11/19/2022 07:21   DG Chest Port 1 View  Result Date: 11/18/2022 CLINICAL DATA:  Status post CABG x3. EXAM: PORTABLE CHEST 1 VIEW COMPARISON:  Preoperative radiograph. FINDINGS: Interval median sternotomy and CABG. Endotracheal tube tip at the level of the clavicular heads. Enteric tube tip and side-port below the diaphragm in the stomach. Right internal jugular Swan-Ganz catheter tip in the region of the proximal main pulmonary outflow tract. Mediastinal drains and presumed left chest tube. Normal heart size for technique. Streaky left perihilar atelectasis. No pulmonary edema. No pneumothorax. No large pleural effusion. IMPRESSION: 1. Interval median sternotomy and CABG. 2. Support apparatus as described. 3. Streaky left perihilar atelectasis. Electronically Signed   By: Narda Rutherford M.D.   On: 11/18/2022 15:02   ECHO INTRAOPERATIVE TEE  Result Date: 11/18/2022  *INTRAOPERATIVE TRANSESOPHAGEAL REPORT *  Patient Name:   MALIKIAH DEBARR Date of Exam: 11/18/2022 Medical Rec #:  [id number]      Height:       71.0 in Accession #:    [id number]     Weight:       252.4 lb Date of Birth:  November 20, 1955      BSA:          2.33 m Patient Age:    66 years       BP:           139/91 mmHg Patient Gender: M              HR:           74 bpm. Exam Location:  Anesthesiology Transesophogeal exam was perform intraoperatively during surgical procedure. Patient was closely monitored under general anesthesia during the entirety of examination. Indications:     I25.110 Atherosclerotic heart disease of native coronary artery                  with unstable angina pectoris Performing Phys: [id number] Salvatore Decent HENDRICKSON Diagnosing Phys: Gaynelle Adu MD Complications: No  known complications during this procedure. POST-OP IMPRESSIONS Overall, there were no significant changes from pre-bypass. PRE-OP FINDINGS  Left Ventricle: The left ventricle has normal systolic function, with an ejection fraction of 60-65%. The cavity size was normal. There is no increase in left ventricular wall thickness. No evidence of left ventricular regional wall motion abnormalities. There is no left ventricular hypertrophy. Right Ventricle: The right ventricle has normal systolic function. The cavity was normal. There is no increase in right ventricular wall thickness. Left Atrium: Left atrial size was not assessed. No left atrial/left atrial appendage thrombus was detected. Right Atrium: Right atrial size was not assessed. Interatrial Septum: No atrial level shunt detected by color flow Doppler. Pericardium: There is no evidence of pericardial effusion. Mitral Valve: The mitral valve is normal in structure.  Mitral valve regurgitation is mild by color flow Doppler. The MR jet is centrally-directed. Mild prolapse of the mitral valve posterior cusp was present. Tricuspid Valve: The tricuspid valve was normal in structure. Tricuspid valve regurgitation is trivial by color flow Doppler. Aortic Valve: The aortic valve is normal in structure. Aortic valve regurgitation is trivial by color flow Doppler. The jet is centrally-directed. There is no stenosis of the aortic valve. Pulmonic Valve: The pulmonic valve was normal in structure. Pulmonic valve regurgitation is trivial by color flow Doppler. +--------------+--------++ LEFT VENTRICLE         +--------------+--------++ PLAX 2D                +--------------+--------++ LVOT diam:    2.30 cm  +--------------+--------++ LVOT Area:    4.15 cm +--------------+--------++                        +--------------+--------++  +--------------+-------+ SHUNTS                +--------------+-------+ Systemic Diam:2.30 cm +--------------+-------+   Gaynelle AduWilliam Fitzgerald MD Electronically signed by Gaynelle AduWilliam Fitzgerald MD Signature Date/Time: 11/18/2022/2:39:40 PM    Final    EP STUDY  Result Date: 11/18/2022 See surgical note for result.   Cardiac Studies  TTE 11/16/2022  1. Left ventricular ejection fraction, by estimation, is 60 to 65%. The  left ventricle has normal function. Left ventricular endocardial border  not optimally defined to evaluate regional wall motion. Left ventricular  diastolic parameters were normal.   2. Right ventricular systolic function is normal. The right ventricular  size is normal.   3. The mitral valve was not well visualized. No evidence of mitral valve  regurgitation. No evidence of mitral stenosis.   4. The aortic valve is tricuspid. Aortic valve regurgitation is not  visualized. No aortic stenosis is present.   Patient Profile  Eula Listeneter P Camey is a 67 y.o. male with asbestosis who was admitted on 11/15/2022 for non-STEMI. Found to have two-vessel CAD not amendable to PCI.   Assessment & Plan   # Non-STEMI # Status post CABG x 3 on 11/18/2022 -Admitted with non-STEMI.  Anatomy not amendable to PCI.  Underwent CABG x 3.  LIMA to LAD, SVG to diagonal, SVG-OM. -Doing well postoperatively.  Diuresis per CT surgery.  Plans to remove lines today.  Chest tube remains in. -Continue aspirin.  Would add Plavix given non-STEMI.  Should complete 1 year of DAPT.  Defer timing of this to CT surgery. -Continue metoprolol to tartrate 25 mg twice daily. -LVEF was normal prior to CABG. -Continue statin. -Cardiology to follow along for postoperative course.  Seems to be doing well.  # HFpEF -LVEDP was elevated prior to surgery.  Was diuresed.  Now a bit volume up after CABG.  Diuresis per CT surgery.     For questions or updates, please contact [facility] Please consult [email or web address] for contact info under        Signed, Gerri SporeWesley T. Flora Lipps'Neal, MD, [facility] Health CenterFACC [place]  [facility]  11/19/2022 9:42 AM

## 2022-11-19 NOTE — Op Note (Signed)
NAME: Christian Davis, Christian P. MEDICAL RECORD NO: [id number] ACCOUNT NO: [id number] DATE OF BIRTH: [birth year] FACILITY: [facility] LOCATION: [facility] PHYSICIAN: Salvatore DecentSteven C. Dorris FetchHendrickson, MD  Operative Report   DATE OF PROCEDURE: 11/18/2022  PREOPERATIVE DIAGNOSIS:  Severe 2-vessel coronary artery disease.  POSTOPERATIVE DIAGNOSIS:  Severe 2-vessel coronary artery disease.  PROCEDURES PERFORMED:   Median sternotomy, extracorporeal circulation,  Coronary artery bypass grafting x 3  Left internal mammary artery to LAD,  Saphenous vein graft to first diagonal,  Saphenous vein graft to first obtuse marginal, Endoscopic vein harvest right thigh,  Open vein harvest right lower leg.  SURGEON:  Salvatore DecentSteven C. Dorris FetchHendrickson, MD  ASSISTANTS:  Kerin SalenSarah Jane Commander, MD Aloha GellBailey Stehler, PA  SECOND ASSISTANT:  Lowella DandyErin Barrett, PA.  ANESTHESIA:  General.  FINDINGS:  Transesophageal echocardiography showed preserved left ventricular function with no significant valvular pathology.  Mild MR.  No change post-bypass.   Radial artery too small to utilize as a bypass graft. Saphenous vein from the thigh good quality, lower leg fair quality.  Difficult to harvest due to surrounding inflammatory process.   Mammary good quality. Diagonal, fair quality.  LAD and OM good quality, Posterior descending too small to graft.  CLINICAL NOTE:  [name] is a 67 year old man with multiple cardiac risk factors, who presented with worsening chest pain.  At catheterization, he was found to have severe 2-vessel disease.  He was referred for coronary artery bypass grafting.  The indications, risks, benefits, and alternatives were discussed in detail with the patient.  He understood and accepted the risks and agreed to proceed.  OPERATIVE NOTE:  [name] was brought to the preoperative holding area on 11/18/2022.  Anesthesia placed a Swan-Ganz catheter and an arterial blood pressure monitoring line.  He was taken to the operating room and  anesthetized and intubated.  A Foley catheter was placed.  Intravenous antibiotics were administered.  Dr. Autumn PattyEdmond Fitzgerald performed transesophageal echocardiography.  Please see his separately dictated note for full details, but findings were as noted above.  The chest, abdomen, legs and left arm were prepped and draped in the usual sterile fashion.  A median sternotomy was performed and the left internal mammary artery was harvested using standard technique with direct vision.  Simultaneously, an incision was made on the volar aspect of the left wrist.  A short segment of the radial artery was dissected out, but was very small in caliber.  There was a pulse with proximal occlusion, but ultimately, even after application of topical vasodilators the vessel was deemed to be too small to utilize as a bypass graft.  An incision was made in the right leg at  the level of the knee, and the greater saphenous vein was harvested from the right thigh endoscopically.  Around the knee, the vein became very small and there was an inflammatory reaction around the vein, which made endoscopic harvest difficult.  The lower leg vein was harvested with an open technique.  2000 units of heparin was administered during the vessel harvest.  The remainder of the full heparin dose was given after the conduits had been harvested and inspected.  A Jackson-Pratt drain was placed in the leg  wound and the wound was closed in standard fashion.  The left radial incision was closed and the left arm was tucked back to the patient's side.  A sternal retractor was placed and was opened gradually. The remainder of the full heparin dose was given.  The pericardium was opened.  There was cardiomegaly.  After  confirming adequate anticoagulation with ACT measurement, the aorta was cannulated via concentric 2-0 Ethibond pledgeted pursestring sutures.  A dual stage venous cannula was placed via a pursestring suture in right atrial appendage.   Cardiopulmonary bypass was initiated.  Flows were maintained per protocol.  The patient was cooled to 32 degrees Celsius.  The coronary arteries were inspected and anastomotic sites were chosen.  The posterior descending was a very small vessel that was not suitable for grafting. There was an inflammatory reaction around the vessels, but the other vessels did appear to be a suitable for bypass.  A foam pad was placed in the pericardium to insulate the heart.  A temperature probe was placed in the myocardial septum and a cardioplegia cannula was placed in the ascending aorta.  The aorta was crossclamped.  The left ventricle was emptied via the aortic root vent.  Cardiac arrest then was achieved with a combination of cold, antegrade blood cardioplegia and topical iced saline, 1.5 liters of cardioplegia was administered.  There was a rapid diastolic arrest.  There was relatively slow cooling but ultimately the septum cooled to 11 degrees Celsius.  A reversed saphenous vein graft was placed end-to-side to the first diagonal branch of the LAD.  The diagonal was a fair quality vessel, but did accept a 1.5 mm probe.  There was plaque in the vessel and it tapered rapidly just beyond the anastomosis and it only accepted 1 mm probe distally.  The vein was of fair quality.  It was anastomosed end-to-side with a running 7-0 Prolene suture.  Cardioplegia was administered down the graft.  There was good flow and good hemostasis.  Next a reversed saphenous vein graft was placed end-to-side to obtuse marginal 1.  This was the large, dominant lateral branch.  It easily accepted a 1.5 mm probe.  It was a good quality vessel at the site of anastomosis.  The vein was good quality.  The end-to-side anastomosis was performed with a running 7-0 Prolene suture.  Probe passed easily proximally and distally at the completion of the anastomosis. Cardioplegia was administered and there was good flow and good hemostasis.  Next, the left  internal mammary artery was brought through a window in the pericardium.  The distal end was bevelled.  It was anastomosed end-to-side to the distal LAD.  Both the mammary and LAD were 1.5 mm good quality vessels.  The end-to-side anastomosis was performed with a running 8-0 Prolene suture.  After completion of the anastomosis, the bulldog clamp was removed.  Rapid septal rewarming was noted.  The bulldog clamp was replaced.  Additional cardioplegia was administered.  The cardioplegia cannula then was removed from the ascending aorta.  The proximal vein graft anastomoses were performed to 4.5 mm punch aortotomies with running 6-0 Prolene sutures.  At the completion of the final proximal anastomosis, the patient was placed in Trendelenburg position.  Lidocaine was administered.  The aortic root was de-aired and the aortic crossclamp was removed.  The total crossclamp time was 69 minutes.  The patient required two defibrillations with 10 joules and then was in sinus rhythm.  While rewarming was completed, all proximal and distal anastomoses were inspected for hemostasis.  Epicardial pacing wires were placed on the right ventricle and right atrium.  When the  patient had rewarmed to a core temperature 37 degrees Celsius, he was weaned from cardiopulmonary bypass on the first attempt.  Total bypass time was 100 minutes.  Initial cardiac index was greater than 2  liters per minute per meter squared.  Post-bypass transesophageal echocardiography initially showed some hypokinesis, but that rapidly improved back to baseline.  There was mild MR, which improved back to baseline.  A test dose of protamine was administered and was well tolerated.  The atrial and aortic cannulae were removed.  The remainder of the protamine was administered without incident.  The chest was irrigated with warm saline.  Hemostasis was achieved.  Left pleural and mediastinal chest tubes were placed through separate  subcostal incisions.  The  sternum was closed with a combination of single and double heavy gauge stainless steel wires.  Pectoralis fascia, subcutaneous tissue and skin were closed in standard fashion.  All sponge, needle and instrument counts were correct at the end of the procedure.  The patient was taken from the operating room to the surgical intensive care unit, intubated and in good condition.  Experienced assistance was necessary for this case due to surgical complexity.  Dr. Susa Simmonds did the radial artery exploration.  Aloha Gell and Erin Barrett harvested the saphenous vein and closed the leg wounds. Aloha Gell provided assistance with the anastomoses with exposure, retraction of delicate tissues, suctioning and suture management.   PUS D: 11/19/2022 3:19:22 pm T: 11/19/2022 8:00:00 pm  JOB: [id number]/ [id number]

## 2022-11-20 ENCOUNTER — Inpatient Hospital Stay (HOSPITAL_COMMUNITY): Payer: Medicare Other

## 2022-11-20 DIAGNOSIS — J61 Pneumoconiosis due to asbestos and other mineral fibers: Secondary | ICD-10-CM

## 2022-11-20 DIAGNOSIS — I25110 Atherosclerotic heart disease of native coronary artery with unstable angina pectoris: Secondary | ICD-10-CM | POA: Diagnosis not present

## 2022-11-20 LAB — GLUCOSE, CAPILLARY
Glucose-Capillary: 94 mg/dL (ref 70–99)
Glucose-Capillary: 97 mg/dL (ref 70–99)
Glucose-Capillary: 90 mg/dL (ref 70–99)

## 2022-11-20 LAB — BASIC METABOLIC PANEL
Anion gap: 8 (ref 5–15)
BUN: 19 mg/dL (ref 8–23)
CO2: 25 mmol/L (ref 22–32)
Calcium: 8.3 mg/dL — ABNORMAL LOW (ref 8.9–10.3)
Chloride: 98 mmol/L (ref 98–111)
Creatinine, Ser: 0.92 mg/dL (ref 0.61–1.24)
GFR, Estimated: >60 mL/min (ref >60)
Glucose, Bld: 109 mg/dL — ABNORMAL HIGH (ref 70–99)
Potassium: 4 mmol/L (ref 3.5–5.1)
Sodium: 131 mmol/L — ABNORMAL LOW (ref 135–145)

## 2022-11-20 LAB — CBC
HCT: 31.5 % — ABNORMAL LOW (ref 39.0–52.0)
Hemoglobin: 10.6 g/dL — ABNORMAL LOW (ref 13.0–17.0)
MCH: 32.3 pg (ref 26.0–34.0)
MCHC: 33.7 g/dL (ref 30.0–36.0)
MCV: 96 fL (ref 80.0–100.0)
Platelets: 112 10*3/uL — ABNORMAL LOW (ref 150–400)
RBC: 3.28 MIL/uL — ABNORMAL LOW (ref 4.22–5.81)
RDW: 12.4 % (ref 11.5–15.5)
WBC: 12.3 10*3/uL — ABNORMAL HIGH (ref 4.0–10.5)
nRBC: 0 % (ref 0.0–0.2)

## 2022-11-20 MED ORDER — FUROSEMIDE 40 MG PO TABS
40.0000 mg | ORAL_TABLET | Freq: Every day | ORAL | Status: DC
  Administered 2022-11-20 – 2022-11-22 (×3): 40 mg via ORAL
  Filled 2022-11-20 (×3): qty 1

## 2022-11-20 MED ORDER — ALUM & MAG HYDROXIDE-SIMETH 200-200-20 MG/5ML PO SUSP: 15.0000 mL | Freq: Four times a day (QID) | ORAL | Status: DC | PRN

## 2022-11-20 MED ORDER — SODIUM CHLORIDE 0.9% FLUSH
3.0000 mL | Freq: Two times a day (BID) | INTRAVENOUS | Status: DC
  Administered 2022-11-20 (×2): 3 mL via INTRAVENOUS

## 2022-11-20 MED ORDER — ~~LOC~~ CARDIAC SURGERY, PATIENT & FAMILY EDUCATION: Freq: Once | Status: AC

## 2022-11-20 MED ORDER — MAGNESIUM HYDROXIDE 400 MG/5ML PO SUSP: 30.0000 mL | Freq: Every day | ORAL | Status: DC | PRN

## 2022-11-20 MED ORDER — SODIUM CHLORIDE 0.9 % IV SOLN: 250.0000 mL | INTRAVENOUS | Status: DC | PRN

## 2022-11-20 MED ORDER — POTASSIUM CHLORIDE CRYS ER 20 MEQ PO TBCR
20.0000 meq | EXTENDED_RELEASE_TABLET | Freq: Every day | ORAL | Status: DC
  Administered 2022-11-20 – 2022-11-22 (×3): 20 meq via ORAL
  Filled 2022-11-20: qty 1
  Filled 2022-11-20 (×4): qty 2

## 2022-11-20 MED ORDER — ZOLPIDEM TARTRATE 5 MG PO TABS: 5.0000 mg | ORAL_TABLET | Freq: Every evening | ORAL | Status: DC | PRN

## 2022-11-20 MED ORDER — SODIUM CHLORIDE 0.9% FLUSH: 3.0000 mL | INTRAVENOUS | Status: DC | PRN

## 2022-11-20 NOTE — Progress Notes (Signed)
2 Days Post-Op Procedure(s) (LRB): CORONARY ARTERY BYPASS GRAFTING (CABG) x THREE BYPASSES USING OPEN LEFT INTERNAL MAMMARY ARTERY AND OPEN HARVESTED RIGHT GREATER SAPHENOUS VEIN (N/A) ATTEMPTED RADIAL ARTERY HARVEST (Left) TRANSESOPHAGEAL ECHOCARDIOGRAM (N/A) Subjective: Some incisional pain when he gets up  Objective: Vital signs in last 24 hours: Temp:  [97.9 F (36.6 C)-99.1 F (37.3 C)] 98.1 F (36.7 C) (04/06 0815) Pulse Rate:  [61-88] 82 (04/06 0823) Cardiac Rhythm: Normal sinus rhythm (04/06 0400) Resp:  [12-22] 19 (04/06 0823) BP: (103-148)/(61-88) 124/78 (04/06 0823) SpO2:  [93 %-99 %] 97 % (04/06 0823) Arterial Line BP: (164-183)/(61-64) 183/64 (04/05 1000) Weight:  [413 kg] 118 kg (04/06 0600)  Hemodynamic parameters for last 24 hours: PAP: (29-50)/(15-30) 50/30 CVP:  [10 mmHg] 10 mmHg  Intake/Output from previous day: 04/05 0701 - 04/06 0700 In: 533.7 [I.V.:86.3; IV Piggyback:447.4] Out: 1965 [Urine:1885; Drains:10; Chest Tube:70] Intake/Output this shift: Total I/O In: 120 [P.O.:120] Out: -   General appearance: alert, cooperative, and no distress Neurologic: intact Heart: regular rate and rhythm Lungs: diminished breath sounds bibasilar Abdomen: normal findings: soft, non-tender  Lab Results: Recent Labs    11/19/22 1633 11/20/22 0354  WBC 15.8* 12.3*  HGB 11.1* 10.6*  HCT 34.1* 31.5*  PLT 120* 112*   BMET:  Recent Labs    11/19/22 1633 11/20/22 0354  NA 132* 131*  K 3.7 4.0  CL 99 98  CO2 23 25  GLUCOSE 149* 109*  BUN 12 19  CREATININE 0.97 0.92  CALCIUM 8.2* 8.3*    PT/INR:  Recent Labs    11/18/22 1413  LABPROT 17.5*  INR 1.5*   ABG    Component Value Date/Time   PHART 7.299 (L) 11/18/2022 1956   HCO3 20.0 11/18/2022 1956   TCO2 21 (L) 11/18/2022 1956   ACIDBASEDEF 6.0 (H) 11/18/2022 1956   O2SAT 97 11/18/2022 1956   CBG (last 3)  Recent Labs    11/19/22 2350 11/20/22 0345 11/20/22 0815  GLUCAP 117* 94 97     Assessment/Plan: S/P Procedure(s) (LRB): CORONARY ARTERY BYPASS GRAFTING (CABG) x THREE BYPASSES USING OPEN LEFT INTERNAL MAMMARY ARTERY AND OPEN HARVESTED RIGHT GREATER SAPHENOUS VEIN (N/A) ATTEMPTED RADIAL ARTERY HARVEST (Left) TRANSESOPHAGEAL ECHOCARDIOGRAM (N/A) POD # 2 Doing well NEURO- intact CV- in SR, BP normal   ASA, statin, beta blocker RESP- continue IS RENAL- creatinine and lytes oK  PO lasix for fluid overload ENDO- CBG well controlled, no DM, dc SSI GI- tolerating diet Thrombocytopenia- mild, monitor Anemia secondary to ABL- mild, monitor SCD + enoxaparin + ambulation for DVT prophylaxis Continue Cardiac rehab Transfer to 4E    LOS: 5 days    Loreli Slot 11/20/2022

## 2022-11-20 NOTE — Progress Notes (Signed)
CARDIAC REHAB PHASE I   Pt feeling well today. Assisted pt oob to bathroom. Reviewed sternal precautions with mobility and IS [place]. Pt left in bathroom with call bell in reach. Will call RN when ready to get up. Will continue to follow and assist with mobility and education.   [phone]  Woodroe Chen, RN BSN 11/20/2022 11:18 AM

## 2022-11-20 NOTE — Progress Notes (Signed)
Rounding Note    Patient Name: Christian Davis Date of Encounter: 11/20/2022  [facility] Cardiologist: Marjo BickerVishnu P Mallipeddi, MD   Subjective   Doing well day #2 status post 3-vessel CABG (LIMA-LAD, SVG-diagonal, SVG-OM1) for severe two-vessel CAD presenting with non-STEMI, preserved left ventricular systolic function. Denies any pain at sternotomy site right now.  Awake alert, conversant.  Has walked.  Chest tubes are out. Good urine output, almost back to net neutral fluid wise. Maintaining normal sinus rhythm.  Inpatient Medications    Scheduled Meds:  acetaminophen  1,000 mg Oral Q6H   Or   acetaminophen (TYLENOL) oral liquid 160 mg/5 mL  1,000 mg Per Tube Q6H   aspirin EC  325 mg Oral Daily   Or   aspirin  324 mg Per Tube Daily   bisacodyl  10 mg Oral Daily   Or   bisacodyl  10 mg Rectal Daily   cetirizine  10 mg Oral Daily   Chlorhexidine Gluconate Cloth  6 each Topical Daily   citalopram  20 mg Oral QHS   docusate sodium  200 mg Oral Daily   enoxaparin (LOVENOX) injection  40 mg Subcutaneous QHS   gabapentin  100 mg Oral QHS   insulin aspart  0-24 Units Subcutaneous Q4H   metoprolol tartrate  25 mg Oral BID   Or   metoprolol tartrate  25 mg Per Tube BID   pantoprazole  40 mg Oral Daily   rosuvastatin  40 mg Oral Daily   sodium chloride flush  3 mL Intravenous Q12H   Continuous Infusions:  sodium chloride 10 mL/hr at 11/20/22 0600   sodium chloride     sodium chloride 10 mL/hr at 11/19/22 0600   lactated ringers     lactated ringers 20 mL/hr at 11/19/22 0600   lactated ringers     PRN Meds: sodium chloride, lactated ringers, metoprolol tartrate, midazolam, morphine injection, ondansetron (ZOFRAN) IV, oxyCODONE, sodium chloride flush, traMADol   Vital Signs    Vitals:   11/20/22 0736 11/20/22 0800 11/20/22 0815 11/20/22 0823  BP: 124/73 106/67  124/78  Pulse: 78 79  82  Resp: (!) 22 15  19   Temp:   98.1 F (36.7 C)   TempSrc:   Oral    SpO2: 95% 93%  97%  Weight:      Height:        Intake/Output Summary (Last 24 hours) at 11/20/2022 0844 Last data filed at 11/20/2022 0600 Gross per 24 hour  Intake 515.8 ml  Output 1795 ml  Net -1279.2 ml      11/20/2022    6:00 AM 11/19/2022    6:30 AM 11/18/2022    4:38 AM  Last 3 Weights  Weight (lbs) 260 lb 2.3 oz 269 lb 2.9 oz 252 lb 6.8 oz  Weight (kg) 118 kg 122.1 kg 114.5 kg      Telemetry    Sinus rhythm- Personally Reviewed  ECG    Normal sinus rhythm, inferior Q waves, mild T wave inversion in the inferolateral leads, normal QTc- Personally Reviewed  Physical Exam  Obese.  Appears comfortable GEN: No acute distress.   Neck: No JVD Cardiac: RRR, no murmurs, rubs, or gallops.  Respiratory: Clear to auscultation bilaterally. GI: Soft, nontender, non-distended  MS: No edema; No deformity. Neuro:  Nonfocal  Psych: Normal affect   Labs    High Sensitivity Troponin:   Recent Labs  Lab 11/15/22 1108 11/15/22 1312  TROPONINIHS 9 36*  Chemistry Recent Labs  Lab 11/19/22 0346 11/19/22 1633 11/20/22 0354  NA 135 132* 131*  K 4.5 3.7 4.0  CL 105 99 98  CO2 19* 23 25  GLUCOSE 110* 149* 109*  BUN 14 12 19   CREATININE 0.84 0.97 0.92  CALCIUM 8.0* 8.2* 8.3*  MG 2.5* 2.3  --   GFRNONAA >60 >60 >60  ANIONGAP 11 10 8     Lipids  Recent Labs  Lab 11/16/22 0220  CHOL 227*  TRIG 71  HDL 62  LDLCALC 151*  CHOLHDL 3.7    Hematology Recent Labs  Lab 11/19/22 0346 11/19/22 1633 11/20/22 0354  WBC 13.3* 15.8* 12.3*  RBC 3.53* 3.50* 3.28*  HGB 11.3* 11.1* 10.6*  HCT 33.5* 34.1* 31.5*  MCV 94.9 97.4 96.0  MCH 32.0 31.7 32.3  MCHC 33.7 32.6 33.7  RDW 12.3 12.6 12.4  PLT 106* 120* 112*   Thyroid  Recent Labs  Lab 11/15/22 1630  TSH 3.896    BNPNo results for input(s): "BNP", "PROBNP" in the last 168 hours.  DDimer No results for input(s): "DDIMER" in the last 168 hours.   Radiology    DG Chest Port 1 View  Result Date:  11/19/2022 CLINICAL DATA:  CABG EXAM: PORTABLE CHEST 1 VIEW COMPARISON:  Yesterday FINDINGS: Interval extubation. Lower volume lungs with increased atelectasis. Chest tubes remain in place. Right IJ sheath with Swan-Ganz catheter, tip at the main pulmonary artery. Cardiomegaly. Unchanged mediastinal contours. Status post CABG. No visible pneumothorax. IMPRESSION: Increased atelectasis after extubation. No visible pneumothorax. Electronically Signed   By: Tiburcio PeaJonathan  Watts M.D.   On: 11/19/2022 07:21   DG Chest Port 1 View  Result Date: 11/18/2022 CLINICAL DATA:  Status post CABG x3. EXAM: PORTABLE CHEST 1 VIEW COMPARISON:  Preoperative radiograph. FINDINGS: Interval median sternotomy and CABG. Endotracheal tube tip at the level of the clavicular heads. Enteric tube tip and side-port below the diaphragm in the stomach. Right internal jugular Swan-Ganz catheter tip in the region of the proximal main pulmonary outflow tract. Mediastinal drains and presumed left chest tube. Normal heart size for technique. Streaky left perihilar atelectasis. No pulmonary edema. No pneumothorax. No large pleural effusion. IMPRESSION: 1. Interval median sternotomy and CABG. 2. Support apparatus as described. 3. Streaky left perihilar atelectasis. Electronically Signed   By: Narda RutherfordMelanie  Sanford M.D.   On: 11/18/2022 15:02   EP STUDY  Result Date: 11/18/2022 See surgical note for result.   Cardiac Studies  Cardiac catheterization 11/16/2022  Diagnostic Dominance: Left    Conclusions: Severe two-vessel coronary artery disease with sequential 50-70% ostial through mid LAD stenoses that are hemodynamically significant by RFR and chronic total occlusion of dominant LCx with left-to-left and right-to-left collaterals. Mildly reduced left ventricular systolic function (LVEF 45-50%) with global hypokinesis. Mild to moderately elevated left heart filling pressure (LVEDP 20-25 mmHg).   Recommendations: Cardiac surgery consultation for  CABG. Aggressive secondary prevention of coronary artery disease. Gentle diuresis; furosemide 20 mg IV x 1 has been ordered. Follow-up echocardiogram.    TTE 11/16/2022  1. Left ventricular ejection fraction, by estimation, is 60 to 65%. The  left ventricle has normal function. Left ventricular endocardial border  not optimally defined to evaluate regional wall motion. Left ventricular  diastolic parameters were normal.   2. Right ventricular systolic function is normal. The right ventricular  size is normal.   3. The mitral valve was not well visualized. No evidence of mitral valve  regurgitation. No evidence of mitral stenosis.   4.  The aortic valve is tricuspid. Aortic valve regurgitation is not  visualized. No aortic stenosis is present.   Patient Profile     67 y.o. male with obesity and asbestosis presenting 11/15/2022 with non-STEMI, found to have severe two-vessel CAD with occlusion of the proximal left circumflex coronary artery and high-grade complex mid LAD disease, s/p CABG (LIMA-LAD, SVG-diagonal, SVG-OM1) 11/18/2022  Assessment & Plan    Progressing well post CABG.  No difficulty with his breathing.  Excellent urine output.  Chest tubes are out. At initial presentation LVEF was mildly decreased at 51% on nuclear stress testing and 45-50% on LV angiography, but follow-up echo showed EF improved to 60-65%.  He does not have any clinical findings to suggest heart failure. Suspect he will soon be going to 4 E.  On track for discharge early next week. On aspirin, beta-blocker, statin. Note untreated LDL 151 but with excellent HDL cholesterol level. At discharge plan to start clopidogrel since he presented with an acute coronary syndrome.     For questions or updates, please contact [facility] Please consult [email or web address] for contact info under        Signed, Thurmon Fair, MD  11/20/2022, 8:44 AM

## 2022-11-20 NOTE — Progress Notes (Signed)
      [address]       [place] [address]             [phone]      No complaints  Awaiting bed on 4E  BP 105/70 (BP Location: Right Arm)   Pulse 90   Temp 98 F (36.7 C) (Oral)   Resp (!) 21   Ht 5\' 11"  (1.803 m)   Wt 118 kg   SpO2 98%   BMI 36.28 kg/m    Intake/Output Summary (Last 24 hours) at 11/20/2022 1802 Last data filed at 11/20/2022 1703 Gross per 24 hour  Intake 794.9 ml  Output 790 ml  Net 4.9 ml   Viviann Spare C. Dorris Fetch, MD [facility] [phone]

## 2022-11-21 ENCOUNTER — Inpatient Hospital Stay (HOSPITAL_COMMUNITY): Payer: Medicare Other

## 2022-11-21 DIAGNOSIS — I25110 Atherosclerotic heart disease of native coronary artery with unstable angina pectoris: Secondary | ICD-10-CM | POA: Diagnosis not present

## 2022-11-21 LAB — CBC
HCT: 30.9 % — ABNORMAL LOW (ref 39.0–52.0)
Hemoglobin: 10.7 g/dL — ABNORMAL LOW (ref 13.0–17.0)
MCH: 32.2 pg (ref 26.0–34.0)
MCHC: 34.6 g/dL (ref 30.0–36.0)
MCV: 93.1 fL (ref 80.0–100.0)
Platelets: 116 10*3/uL — ABNORMAL LOW (ref 150–400)
RBC: 3.32 MIL/uL — ABNORMAL LOW (ref 4.22–5.81)
RDW: 12.2 % (ref 11.5–15.5)
WBC: 11.7 10*3/uL — ABNORMAL HIGH (ref 4.0–10.5)
nRBC: 0 % (ref 0.0–0.2)

## 2022-11-21 LAB — BASIC METABOLIC PANEL
Anion gap: 5 (ref 5–15)
BUN: 18 mg/dL (ref 8–23)
CO2: 25 mmol/L (ref 22–32)
Calcium: 8 mg/dL — ABNORMAL LOW (ref 8.9–10.3)
Chloride: 101 mmol/L (ref 98–111)
Creatinine, Ser: 0.77 mg/dL (ref 0.61–1.24)
GFR, Estimated: >60 mL/min (ref >60)
Glucose, Bld: 107 mg/dL — ABNORMAL HIGH (ref 70–99)
Potassium: 3.7 mmol/L (ref 3.5–5.1)
Sodium: 131 mmol/L — ABNORMAL LOW (ref 135–145)

## 2022-11-21 MED ORDER — LACTULOSE 10 GM/15ML PO SOLN: 20.0000 g | Freq: Every day | ORAL | Status: DC | PRN

## 2022-11-21 MED ORDER — CLOPIDOGREL BISULFATE 75 MG PO TABS
75.0000 mg | ORAL_TABLET | Freq: Every day | ORAL | Status: DC
  Administered 2022-11-22: 75 mg via ORAL
  Filled 2022-11-21: qty 1

## 2022-11-21 MED ORDER — ASPIRIN 81 MG PO TBEC
81.0000 mg | DELAYED_RELEASE_TABLET | Freq: Every day | ORAL | Status: DC
  Administered 2022-11-22: 81 mg via ORAL
  Filled 2022-11-21: qty 1

## 2022-11-21 MED ORDER — LOSARTAN POTASSIUM 25 MG PO TABS
25.0000 mg | ORAL_TABLET | Freq: Every day | ORAL | Status: DC
  Administered 2022-11-21 – 2022-11-22 (×2): 25 mg via ORAL
  Filled 2022-11-21 (×2): qty 1

## 2022-11-21 MED ORDER — POTASSIUM CHLORIDE CRYS ER 10 MEQ PO TBCR
40.0000 meq | EXTENDED_RELEASE_TABLET | Freq: Once | ORAL | Status: AC
  Administered 2022-11-21: 40 meq via ORAL
  Filled 2022-11-21: qty 4

## 2022-11-21 MED ORDER — ASPIRIN 81 MG PO CHEW: 81.0000 mg | CHEWABLE_TABLET | Freq: Every day | ORAL | Status: DC

## 2022-11-21 NOTE — Progress Notes (Signed)
Rounding Note    Patient Name: Christian Davis Date of Encounter: 11/21/2022  [facility] Cardiologist: Marjo Bicker, MD   Subjective   Recovering well postop.  Has some mild sternotomy pain.  Has already walked today without difficulty.  Inpatient Medications    Scheduled Meds:  acetaminophen  1,000 mg Oral Q6H   Or   acetaminophen (TYLENOL) oral liquid 160 mg/5 mL  1,000 mg Per Tube Q6H   aspirin EC  325 mg Oral Daily   Or   aspirin  324 mg Per Tube Daily   bisacodyl  10 mg Oral Daily   Or   bisacodyl  10 mg Rectal Daily   cetirizine  10 mg Oral Daily   Chlorhexidine Gluconate Cloth  6 each Topical Daily   citalopram  20 mg Oral QHS   docusate sodium  200 mg Oral Daily   furosemide  40 mg Oral Daily   gabapentin  100 mg Oral QHS   losartan  25 mg Oral Daily   metoprolol tartrate  25 mg Oral BID   Or   metoprolol tartrate  25 mg Per Tube BID   pantoprazole  40 mg Oral Daily   potassium chloride  20 mEq Oral Daily   rosuvastatin  40 mg Oral Daily   Continuous Infusions:  PRN Meds: alum & mag hydroxide-simeth, lactulose, magnesium hydroxide, metoprolol tartrate, ondansetron (ZOFRAN) IV, oxyCODONE, traMADol, zolpidem   Vital Signs    Vitals:   11/20/22 2100 11/20/22 2315 11/21/22 0403 11/21/22 0818  BP: (!) 154/84 121/83 (!) 143/79 133/78  Pulse: 87 72 80 86  Resp: 20 19 18 17   Temp: 97.7 F (36.5 C) 97.7 F (36.5 C) 97.7 F (36.5 C) 98.3 F (36.8 C)  TempSrc: Oral Oral Oral Oral  SpO2: 96% 95% 98% 96%  Weight: 119.7 kg     Height:        Intake/Output Summary (Last 24 hours) at 11/21/2022 0934 Last data filed at 11/20/2022 1915 Gross per 24 hour  Intake 170 ml  Output 400 ml  Net -230 ml      11/20/2022    9:00 PM 11/20/2022    6:00 AM 11/19/2022    6:30 AM  Last 3 Weights  Weight (lbs) 264 lb 260 lb 2.3 oz 269 lb 2.9 oz  Weight (kg) 119.75 kg 118 kg 122.1 kg      Telemetry    Sinus rhythm with PVCs and occasional couplets-  Personally Reviewed  ECG    No new tracing- Personally Reviewed  Physical Exam  Comfortable lying entirely supine in bed.  Healthy appearing sternotomy and saphenectomy sites GEN: No acute distress.   Neck: No JVD Cardiac: RRR, no murmurs, rubs, or gallops.  Respiratory: Clear to auscultation bilaterally. GI: Soft, nontender, non-distended  MS: No edema; No deformity. Neuro:  Nonfocal  Psych: Normal affect   Labs    High Sensitivity Troponin:   Recent Labs  Lab 11/15/22 1108 11/15/22 1312  TROPONINIHS 9 36*     Chemistry Recent Labs  Lab 11/19/22 0346 11/19/22 1633 11/20/22 0354 11/21/22 0127  NA 135 132* 131* 131*  K 4.5 3.7 4.0 3.7  CL 105 99 98 101  CO2 19* 23 25 25   GLUCOSE 110* 149* 109* 107*  BUN 14 12 19 18   CREATININE 0.84 0.97 0.92 0.77  CALCIUM 8.0* 8.2* 8.3* 8.0*  MG 2.5* 2.3  --   --   GFRNONAA >60 >60 >60 >60  ANIONGAP  11 10 8 5     Lipids  Recent Labs  Lab 11/16/22 0220  CHOL 227*  TRIG 71  HDL 62  LDLCALC 151*  CHOLHDL 3.7    Hematology Recent Labs  Lab 11/19/22 1633 11/20/22 0354 11/21/22 0127  WBC 15.8* 12.3* 11.7*  RBC 3.50* 3.28* 3.32*  HGB 11.1* 10.6* 10.7*  HCT 34.1* 31.5* 30.9*  MCV 97.4 96.0 93.1  MCH 31.7 32.3 32.2  MCHC 32.6 33.7 34.6  RDW 12.6 12.4 12.2  PLT 120* 112* 116*   Thyroid  Recent Labs  Lab 11/15/22 1630  TSH 3.896    BNPNo results for input(s): "BNP", "PROBNP" in the last 168 hours.  DDimer No results for input(s): "DDIMER" in the last 168 hours.   Radiology    DG Chest 2 View  Result Date: 11/21/2022 CLINICAL DATA:  67 year old male status post CABG. EXAM: CHEST - 2 VIEW COMPARISON:  11/20/2022 FINDINGS: Similar appearing postsurgical changes after coronary artery bypass graft. Interval removal of right internal jugular central venous catheter. Improved aeration of the lungs bilaterally with trace left pleural effusion and left basilar atelectasis. Unchanged trace right apical pneumothorax. No  acute osseous abnormality. IMPRESSION: 1. Improved aeration of the lungs bilaterally with trace left pleural effusion and left basilar atelectasis. 2. Unchanged trace right apical pneumothorax. Electronically Signed   By: Marliss Coots M.D.   On: 11/21/2022 07:01   DG Chest Port 1 View  Result Date: 11/20/2022 CLINICAL DATA:  Status post CABG. EXAM: PORTABLE CHEST 1 VIEW COMPARISON:  11/19/2022 FINDINGS: Stable enlarged cardiac silhouette. The right jugular Swan-Ganz catheter has been removed and the sheath remains in place. Less than 5% right apical pneumothorax. The mediastinal and left chest tubes have been removed. Stable post CABG changes. Mildly improved atelectasis in the left mid and lower lung zone. No pleural fluid. Stable cervical spine fixation hardware and thoracic spine degenerative changes. IMPRESSION: 1. Less than 5% right apical pneumothorax. 2. Mildly improved left mid and lower lung zone atelectasis. 3. Stable cardiomegaly. These results will be called to the ordering clinician or representative by the Radiologist Assistant, and communication documented in the PACS or [employer]. Electronically Signed   By: Beckie Salts M.D.   On: 11/20/2022 09:38    Cardiac Studies   Cardiac catheterization 11/16/2022   Diagnostic Dominance: Left    Conclusions: Severe two-vessel coronary artery disease with sequential 50-70% ostial through mid LAD stenoses that are hemodynamically significant by RFR and chronic total occlusion of dominant LCx with left-to-left and right-to-left collaterals. Mildly reduced left ventricular systolic function (LVEF 45-50%) with global hypokinesis. Mild to moderately elevated left heart filling pressure (LVEDP 20-25 mmHg).   Recommendations: Cardiac surgery consultation for CABG. Aggressive secondary prevention of coronary artery disease. Gentle diuresis; furosemide 20 mg IV x 1 has been ordered. Follow-up echocardiogram.     TTE 11/16/2022  1. Left  ventricular ejection fraction, by estimation, is 60 to 65%. The  left ventricle has normal function. Left ventricular endocardial border  not optimally defined to evaluate regional wall motion. Left ventricular  diastolic parameters were normal.   2. Right ventricular systolic function is normal. The right ventricular  size is normal.   3. The mitral valve was not well visualized. No evidence of mitral valve  regurgitation. No evidence of mitral stenosis.   4. The aortic valve is tricuspid. Aortic valve regurgitation is not  visualized. No aortic stenosis is present.   Patient Profile     67 y.o. male  with obesity and asbestosis presenting 11/15/2022 with non-STEMI, found to have severe two-vessel CAD with occlusion of the proximal left circumflex coronary artery and high-grade complex mid LAD disease, s/p CABG (LIMA-LAD, SVG-diagonal, SVG-OM1) 11/18/2022   Assessment & Plan    Excellent progress after bypass surgery. No evidence of heart failure.  No atrial fibrillation.  Relatively frequent PVCs, but no sustained or nonsustained VT. Continue aspirin, beta-blocker, statin.  Start clopidogrel after pacemaker wires are removed. Encourage cardiac rehab.     For questions or updates, please contact [facility] Please consult [email or web address] for contact info under        Signed, Thurmon FairMihai Hamish Banks, MD  11/21/2022, 9:34 AM

## 2022-11-21 NOTE — Progress Notes (Addendum)
      [address]       [place] [address]             [phone]       3 Days Post-Op Procedure(s) (LRB): CORONARY ARTERY BYPASS GRAFTING (CABG) x THREE BYPASSES USING OPEN LEFT INTERNAL MAMMARY ARTERY AND OPEN HARVESTED RIGHT GREATER SAPHENOUS VEIN (N/A) ATTEMPTED RADIAL ARTERY HARVEST (Left) TRANSESOPHAGEAL ECHOCARDIOGRAM (N/A)  Subjective:  Patient sitting up in chair.  Denies pain, nausea, vomiting.   No Bm yet, minimal gas  Objective: Vital signs in last 24 hours: Temp:  [97.7 F (36.5 C)-98.1 F (36.7 C)] 97.7 F (36.5 C) (04/07 0403) Pulse Rate:  [72-91] 80 (04/07 0403) Cardiac Rhythm: Normal sinus rhythm (04/06 2118) Resp:  [15-22] 18 (04/07 0403) BP: (105-154)/(62-84) 143/79 (04/07 0403) SpO2:  [92 %-99 %] 98 % (04/07 0403) Weight:  [119.7 kg] 119.7 kg (04/06 2100)  Intake/Output from previous day: 04/06 0701 - 04/07 0700 In: 290 [P.O.:290] Out: 480 [Urine:480]  General appearance: alert, cooperative, and no distress Heart: regular rate and rhythm Lungs: diminished breath sounds left base Abdomen: soft, non-tender; bowel sounds normal; no masses,  no organomegaly Extremities: edema trace Wound: clean and dry  Lab Results: Recent Labs    11/20/22 0354 11/21/22 0127  WBC 12.3* 11.7*  HGB 10.6* 10.7*  HCT 31.5* 30.9*  PLT 112* 116*   BMET:  Recent Labs    11/20/22 0354 11/21/22 0127  NA 131* 131*  K 4.0 3.7  CL 98 101  CO2 25 25  GLUCOSE 109* 107*  BUN 19 18  CREATININE 0.92 0.77  CALCIUM 8.3* 8.0*    PT/INR:  Recent Labs    11/18/22 1413  LABPROT 17.5*  INR 1.5*   ABG    Component Value Date/Time   PHART 7.299 (L) 11/18/2022 1956   HCO3 20.0 11/18/2022 1956   TCO2 21 (L) 11/18/2022 1956   ACIDBASEDEF 6.0 (H) 11/18/2022 1956   O2SAT 97 11/18/2022 1956   CBG (last 3)  Recent Labs    11/20/22 0345 11/20/22 0815 11/20/22 1115  GLUCAP 94 97 90    Assessment/Plan: S/P Procedure(s) (LRB): CORONARY ARTERY  BYPASS GRAFTING (CABG) x THREE BYPASSES USING OPEN LEFT INTERNAL MAMMARY ARTERY AND OPEN HARVESTED RIGHT GREATER SAPHENOUS VEIN (N/A) ATTEMPTED RADIAL ARTERY HARVEST (Left) TRANSESOPHAGEAL ECHOCARDIOGRAM (N/A)  CV- NSR, mild HTN- continue Lopressor, start low dose Cozaar Pulm- CXR with stable trace right apical pneumothorax, small left pleural effusion, continue IS Renal- creatinine WNL, weight is trending down continue Lasix, potassium GI- mild constipation, minimal passing of gas.. add Lactulose prn Expected post operative thrombocytopenia, improving Dispo- patient stable, add low dose cozaar for BP control, d/c EPW today, lactulose prn for constipation.Marland Kitchen if remains stable for possible discharge in 24-48 hours   LOS: 6 days    Lowella Dandy, PA-C 11/21/2022  Patient seen and examined, agree with assessment and plan as outlined above Doing well Probably home tomorrow  Salvatore Decent. Dorris Fetch, MD [facility] [phone]

## 2022-11-21 NOTE — TOC Progression Note (Signed)
Transition of Care [facility]) - Progression Note    Patient Details  Name: Christian Davis MRN: [id number] Date of Birth: Aug 21, 1955  Transition of [facility]) CM/SW Contact  Lawerance Sabal, RN Phone Number: 11/21/2022, 2:38 PM  Clinical Narrative:     Rollator ordered per patient request through [employer]. It will be delivered to the room today.     Barriers to Discharge: Continued Medical Work up  Expected Discharge Plan and Services   Discharge Planning Services: CM Consult                     DME Arranged: Dan Humphreys rolling with seat DME Agency: [employer] Date DME Agency Contacted: 11/21/22 Time DME Agency Contacted: 1438 Representative spoke with at DME Agency: Leavy Cella             Social Determinants of Health (SDOH) Interventions SDOH Screenings   Food Insecurity: No Food Insecurity (11/15/2022)  Housing: Low Risk  (11/15/2022)  Transportation Needs: No Transportation Needs (11/15/2022)  Utilities: Not At Risk (11/15/2022)  Depression (PHQ2-9): Low Risk  (02/08/2020)  Tobacco Use: Low Risk  (11/19/2022)    Readmission Risk Interventions     No data to display

## 2022-11-21 NOTE — Progress Notes (Signed)
Pacing wires pulled per order, wires clean and and intact vss 137/78 HR 78 RR 20, pt on bedrest until 1417

## 2022-11-22 DIAGNOSIS — Z0279 Encounter for issue of other medical certificate: Secondary | ICD-10-CM

## 2022-11-22 DIAGNOSIS — E7849 Other hyperlipidemia: Secondary | ICD-10-CM | POA: Diagnosis not present

## 2022-11-22 DIAGNOSIS — I214 Non-ST elevation (NSTEMI) myocardial infarction: Secondary | ICD-10-CM | POA: Diagnosis not present

## 2022-11-22 DIAGNOSIS — I200 Unstable angina: Secondary | ICD-10-CM | POA: Diagnosis not present

## 2022-11-22 DIAGNOSIS — Z951 Presence of aortocoronary bypass graft: Secondary | ICD-10-CM | POA: Diagnosis not present

## 2022-11-22 MED ORDER — ACETAMINOPHEN 500 MG PO TABS: 1000.0000 mg | ORAL_TABLET | Freq: Four times a day (QID) | ORAL | 0 refills | Status: AC | PRN

## 2022-11-22 MED ORDER — POTASSIUM CHLORIDE CRYS ER 20 MEQ PO TBCR: 20.0000 meq | EXTENDED_RELEASE_TABLET | Freq: Every day | ORAL | 0 refills | Status: DC

## 2022-11-22 MED ORDER — LOSARTAN POTASSIUM 25 MG PO TABS: 25.0000 mg | ORAL_TABLET | Freq: Every day | ORAL | 3 refills | Status: DC

## 2022-11-22 MED ORDER — METOPROLOL TARTRATE 25 MG PO TABS
37.5000 mg | ORAL_TABLET | Freq: Two times a day (BID) | ORAL | Status: DC
  Administered 2022-11-22: 37.5 mg via ORAL
  Filled 2022-11-22: qty 1

## 2022-11-22 MED ORDER — FUROSEMIDE 40 MG PO TABS: 40.0000 mg | ORAL_TABLET | Freq: Every day | ORAL | 0 refills | Status: DC

## 2022-11-22 MED ORDER — MELOXICAM 7.5 MG PO TABS: 7.5000 mg | ORAL_TABLET | Freq: Every day | ORAL | Status: DC | PRN

## 2022-11-22 MED ORDER — ROSUVASTATIN CALCIUM 40 MG PO TABS: 40.0000 mg | ORAL_TABLET | Freq: Every day | ORAL | 3 refills | Status: DC

## 2022-11-22 MED ORDER — CLOPIDOGREL BISULFATE 75 MG PO TABS: 75.0000 mg | ORAL_TABLET | Freq: Every day | ORAL | 1 refills | Status: DC

## 2022-11-22 MED ORDER — ASPIRIN 81 MG PO TBEC: 81.0000 mg | DELAYED_RELEASE_TABLET | Freq: Every day | ORAL | 12 refills | Status: DC

## 2022-11-22 MED ORDER — METOPROLOL TARTRATE 37.5 MG PO TABS: 37.5000 mg | ORAL_TABLET | Freq: Two times a day (BID) | ORAL | 1 refills | Status: DC

## 2022-11-22 MED ORDER — POTASSIUM CHLORIDE CRYS ER 20 MEQ PO TBCR
40.0000 meq | EXTENDED_RELEASE_TABLET | Freq: Once | ORAL | Status: AC
  Administered 2022-11-22: 40 meq via ORAL
  Filled 2022-11-22: qty 2

## 2022-11-22 MED ORDER — TRAMADOL HCL 50 MG PO TABS: 50.0000 mg | ORAL_TABLET | Freq: Four times a day (QID) | ORAL | 0 refills | Status: DC | PRN

## 2022-11-22 MED FILL — Potassium Chloride Inj 2 mEq/ML: INTRAVENOUS | Qty: 40 | Status: AC

## 2022-11-22 MED FILL — Heparin Sodium (Porcine) Inj 1000 Unit/ML: Qty: 1000 | Status: AC

## 2022-11-22 MED FILL — Magnesium Sulfate Inj 50%: INTRAMUSCULAR | Qty: 10 | Status: AC

## 2022-11-22 NOTE — Plan of Care (Signed)
  Problem: Education: Goal: Understanding of cardiac disease, CV risk reduction, and recovery process will improve Outcome: Adequate for Discharge Goal: Individualized Educational Video(s) Outcome: Adequate for Discharge   Problem: Activity: Goal: Ability to tolerate increased activity will improve Outcome: Adequate for Discharge   Problem: Cardiac: Goal: Ability to achieve and maintain adequate cardiovascular perfusion will improve Outcome: Adequate for Discharge   Problem: Health Behavior/Discharge Planning: Goal: Ability to safely manage health-related needs after discharge will improve Outcome: Adequate for Discharge   Problem: Education: Goal: Understanding of CV disease, CV risk reduction, and recovery process will improve Outcome: Adequate for Discharge Goal: Individualized Educational Video(s) Outcome: Adequate for Discharge   Problem: Activity: Goal: Ability to return to baseline activity level will improve Outcome: Adequate for Discharge   Problem: Cardiovascular: Goal: Ability to achieve and maintain adequate cardiovascular perfusion will improve Outcome: Adequate for Discharge Goal: Vascular access site(s) Level 0-1 will be maintained Outcome: Adequate for Discharge   Problem: Health Behavior/Discharge Planning: Goal: Ability to safely manage health-related needs after discharge will improve Outcome: Adequate for Discharge   Problem: Education: Goal: Knowledge of General Education information will improve Description: Including pain rating scale, medication(s)/side effects and non-pharmacologic comfort measures Outcome: Adequate for Discharge   Problem: Health Behavior/Discharge Planning: Goal: Ability to manage health-related needs will improve Outcome: Adequate for Discharge   Problem: Clinical Measurements: Goal: Ability to maintain clinical measurements within normal limits will improve Outcome: Adequate for Discharge Goal: Will remain free from  infection Outcome: Adequate for Discharge Goal: Diagnostic test results will improve Outcome: Adequate for Discharge Goal: Respiratory complications will improve Outcome: Adequate for Discharge Goal: Cardiovascular complication will be avoided Outcome: Adequate for Discharge   Problem: Activity: Goal: Risk for activity intolerance will decrease Outcome: Adequate for Discharge   Problem: Nutrition: Goal: Adequate nutrition will be maintained Outcome: Adequate for Discharge   Problem: Coping: Goal: Level of anxiety will decrease Outcome: Adequate for Discharge   Problem: Elimination: Goal: Will not experience complications related to bowel motility Outcome: Adequate for Discharge Goal: Will not experience complications related to urinary retention Outcome: Adequate for Discharge   Problem: Pain Managment: Goal: General experience of comfort will improve Outcome: Adequate for Discharge   Problem: Safety: Goal: Ability to remain free from injury will improve Outcome: Adequate for Discharge   Problem: Skin Integrity: Goal: Risk for impaired skin integrity will decrease Outcome: Adequate for Discharge   Problem: Education: Goal: Will demonstrate proper wound care and an understanding of methods to prevent future damage Outcome: Adequate for Discharge Goal: Knowledge of disease or condition will improve Outcome: Adequate for Discharge Goal: Knowledge of the prescribed therapeutic regimen will improve Outcome: Adequate for Discharge Goal: Individualized Educational Video(s) Outcome: Adequate for Discharge   Problem: Activity: Goal: Risk for activity intolerance will decrease Outcome: Adequate for Discharge   Problem: Cardiac: Goal: Will achieve and/or maintain hemodynamic stability Outcome: Adequate for Discharge   Problem: Clinical Measurements: Goal: Postoperative complications will be avoided or minimized Outcome: Adequate for Discharge   Problem:  Respiratory: Goal: Respiratory status will improve Outcome: Adequate for Discharge   Problem: Skin Integrity: Goal: Wound healing without signs and symptoms of infection Outcome: Adequate for Discharge Goal: Risk for impaired skin integrity will decrease Outcome: Adequate for Discharge   Problem: Urinary Elimination: Goal: Ability to achieve and maintain adequate renal perfusion and functioning will improve Outcome: Adequate for Discharge

## 2022-11-22 NOTE — Progress Notes (Addendum)
      [address]       [place] [address]             [phone]      4 Days Post-Op Procedure(s) (LRB): CORONARY ARTERY BYPASS GRAFTING (CABG) x THREE BYPASSES USING OPEN LEFT INTERNAL MAMMARY ARTERY AND OPEN HARVESTED RIGHT GREATER SAPHENOUS VEIN (N/A) ATTEMPTED RADIAL ARTERY HARVEST (Left) TRANSESOPHAGEAL ECHOCARDIOGRAM (N/A) Subjective: Patient states his sternal pain has improved. He does have some neck soreness with a history of disc surgery.   Objective: Vital signs in last 24 hours: Temp:  [97.7 F (36.5 C)-98.5 F (36.9 C)] 98.2 F (36.8 C) (04/08 0501) Pulse Rate:  [72-94] 79 (04/08 0501) Cardiac Rhythm: Normal sinus rhythm (04/07 1900) Resp:  [16-25] 20 (04/08 0558) BP: (124-152)/(69-86) 152/85 (04/08 0501) SpO2:  [90 %-98 %] 97 % (04/08 0501) Weight:  [117.3 kg] 117.3 kg (04/08 0558)  Hemodynamic parameters for last 24 hours:    Intake/Output from previous day: 04/07 0701 - 04/08 0700 In: -  Out: 125 [Urine:125] Intake/Output this shift: No intake/output data recorded.  General appearance: alert, cooperative, and no distress Neurologic: intact Heart: Regular rate and rhythm, PVCs, no murmur Lungs: Slightly diminished left basilar breath sounds Abdomen: soft, non-tender; bowel sounds normal; no masses,  no organomegaly Extremities: edema trace Wound: Clean and dry, no erythema or sign of infection. Ecchymosis surrounding sternal and right leg incisions.  Lab Results: Recent Labs    11/20/22 0354 11/21/22 0127  WBC 12.3* 11.7*  HGB 10.6* 10.7*  HCT 31.5* 30.9*  PLT 112* 116*   BMET:  Recent Labs    11/20/22 0354 11/21/22 0127  NA 131* 131*  K 4.0 3.7  CL 98 101  CO2 25 25  GLUCOSE 109* 107*  BUN 19 18  CREATININE 0.92 0.77  CALCIUM 8.3* 8.0*    PT/INR: No results for input(s): "LABPROT", "INR" in the last 72 hours. ABG    Component Value Date/Time   PHART 7.299 (L) 11/18/2022 1956   HCO3 20.0 11/18/2022 1956    TCO2 21 (L) 11/18/2022 1956   ACIDBASEDEF 6.0 (H) 11/18/2022 1956   O2SAT 97 11/18/2022 1956   CBG (last 3)  Recent Labs    11/20/22 0345 11/20/22 0815 11/20/22 1115  GLUCAP 94 97 90    Assessment/Plan: S/P Procedure(s) (LRB): CORONARY ARTERY BYPASS GRAFTING (CABG) x THREE BYPASSES USING OPEN LEFT INTERNAL MAMMARY ARTERY AND OPEN HARVESTED RIGHT GREATER SAPHENOUS VEIN (N/A) ATTEMPTED RADIAL ARTERY HARVEST (Left) TRANSESOPHAGEAL ECHOCARDIOGRAM (N/A)  Neuro: Pain improving. Some neck soreness with hx of disc surgery. Likely due to sleeping position.   CV: Hx of HTN. SBP 128-152. HR 70s-90s. On Lopressor 25mg  BID and Cozaar 25mg  daily. Plavix restarted for NSTEMI. Titrate Lopressor to 37.5mg  BID.   Pulm: Saturating 97% on RA. Stable trace right apical pneumothorax and trace left pleural effusion with atelectasis. Ambulated 3-4 times yesterday. Continue IS and ambulation.  GI: +BM yesterday  Renal: No new labs. Last Cr 0.77 on 04/07. UO 125cc/24hrs, not all recorded. +6lbs from preop weight, down 6lbs from yesterday if accurate. K 3.7. Continue Lasix and supplement potassium.   Hyponatremia: Mild, Stable Na 131. Likely due to diuresis  Thrombocytopenia: Improving, last plt 116,000  ID: Leukocytosis trending down. Likely reactive.  DVT Prophylaxis: Ambulation, on Plavix.  Dispo: D/C to home today?   LOS: 7 days    Jenny Reichmann, PA-C 11/22/2022

## 2022-11-22 NOTE — Care Management Important Message (Signed)
Important Message  Patient Details  Name: Christian Davis MRN: [id number] Date of Birth: 1955/12/29   Medicare Important Message Given:  Yes     Renie Ora 11/22/2022, 9:27 AM

## 2022-11-22 NOTE — TOC Transition Note (Signed)
Transition of Care (TOC) - CM/SW Discharge Note Donn Pierini RN, BSN Transitions of Care Unit 4E- RN Case Manager See Treatment Team for direct phone #   Patient Details  Name: Christian Davis MRN: [id number] Date of Birth: 08-16-1956  Transition of Care [facility]) CM/SW Contact:  Darrold Span, RN Phone Number: 11/22/2022, 11:42 AM   Clinical Narrative:    Pt stable for transition home today, DME- rollator has been delivered to room by [employer],  No further TOC needs noted, family to transport home.    Final next level of care: Home/Self Care Barriers to Discharge: Barriers Resolved   Patient Goals and CMS Choice CMS Medicare.gov Compare Post Acute Care list provided to:: Patient    Discharge Placement                   Home      Discharge Plan and Services Additional resources added to the After Visit Summary for     Discharge Planning Services: CM Consult Post Acute Care Choice: Durable Medical Equipment          DME Arranged: Walker rolling with seat DME Agency: [employer] Date DME Agency Contacted: 11/21/22 Time DME Agency Contacted: 1438 Representative spoke with at DME Agency: Jasmine HH Arranged: NA HH Agency: NA        Social Determinants of Health (SDOH) Interventions SDOH Screenings   Food Insecurity: No Food Insecurity (11/15/2022)  Housing: Low Risk  (11/15/2022)  Transportation Needs: No Transportation Needs (11/15/2022)  Utilities: Not At Risk (11/15/2022)  Depression (PHQ2-9): Low Risk  (02/08/2020)  Tobacco Use: Low Risk  (11/19/2022)     Readmission Risk Interventions    11/22/2022   11:42 AM  Readmission Risk Prevention Plan  Post Dischage Appt Complete  Medication Screening Complete  Transportation Screening Complete

## 2022-11-22 NOTE — Final Progress Note (Signed)
Discharge instructions (including medications) discussed with and copy provided to patient/caregiver 

## 2022-11-22 NOTE — Progress Notes (Signed)
CARDIAC REHAB PHASE I    Post OHS education including site care, restrictions, risk factors, heart healthy diet, exercise guidelines, home needs at discharge, IS use at home, sternal precautions and CRP2 reviewed with pt and family. All questions and concerns addressed. Will refer to [facility] for CRP2. Plan for home today. Assisted to bathroom, oob using appropriate sternal precautions. Back to bed with call bell and bedside table in reach.   [id number]  Woodroe Chen, RN BSN 11/22/2022 9:34 AM

## 2022-11-22 NOTE — Progress Notes (Addendum)
Rounding Note    Patient Name: Christian Davis Date of Encounter: 11/22/2022  [facility] Cardiologist: Marjo Bicker, MD   Subjective   He is feeling well, tolerating ambulation, denied any chest pain, SOB. He is planned for discharge home today.   Inpatient Medications    Scheduled Meds:  acetaminophen  1,000 mg Oral Q6H   Or   acetaminophen (TYLENOL) oral liquid 160 mg/5 mL  1,000 mg Per Tube Q6H   aspirin EC  81 mg Oral Daily   Or   aspirin  81 mg Per Tube Daily   bisacodyl  10 mg Oral Daily   Or   bisacodyl  10 mg Rectal Daily   cetirizine  10 mg Oral Daily   Chlorhexidine Gluconate Cloth  6 each Topical Daily   citalopram  20 mg Oral QHS   clopidogrel  75 mg Oral Daily   docusate sodium  200 mg Oral Daily   furosemide  40 mg Oral Daily   gabapentin  100 mg Oral QHS   losartan  25 mg Oral Daily   metoprolol tartrate  37.5 mg Oral BID   pantoprazole  40 mg Oral Daily   potassium chloride SA  20 mEq Oral Daily   rosuvastatin  40 mg Oral Daily   Continuous Infusions:  PRN Meds: alum & mag hydroxide-simeth, lactulose, magnesium hydroxide, metoprolol tartrate, ondansetron (ZOFRAN) IV, oxyCODONE, traMADol, zolpidem   Vital Signs    Vitals:   11/22/22 0224 11/22/22 0501 11/22/22 0558 11/22/22 0830  BP: 128/86 (!) 152/85  (!) 146/80  Pulse: 78 79    Resp: 20 20 20 20   Temp:  98.2 F (36.8 C)  98.2 F (36.8 C)  TempSrc: Oral Oral  Oral  SpO2: 96% 97%    Weight:   117.3 kg   Height:        Intake/Output Summary (Last 24 hours) at 11/22/2022 0845 Last data filed at 11/21/2022 2023 Gross per 24 hour  Intake --  Output 125 ml  Net -125 ml      11/22/2022    5:58 AM 11/20/2022    9:00 PM 11/20/2022    6:00 AM  Last 3 Weights  Weight (lbs) 258 lb 9.6 oz 264 lb 260 lb 2.3 oz  Weight (kg) 117.3 kg 119.75 kg 118 kg      Telemetry    Sinus rhythm, frequent PVCs, NSVT lasting 3 beats noted  - Personally Reviewed  ECG    N/A today  - Personally  Reviewed  Physical Exam   GEN: No acute distress. Obese.  Neck: No JVD Cardiac: RRR, no murmurs, rubs, or gallops.  Respiratory: Clear to auscultation bilaterally. On room air.  GI: Soft, nontender, non-distended  MS: Right leg incision intact, mild surrounding ecchymosis, mild edema, no bleeding or drainage; sternal incision intact, mild surrounding ecchymosis, no bleeding or drainage; left wrist incision intact, mild surrounding ecchymosis, no bleeding or drainage. Right wrist post cath site with dressing, pulse 2+, no neurovascular deficit of right hand.  Neuro:  Nonfocal  Psych: Normal affect   Labs    High Sensitivity Troponin:   Recent Labs  Lab 11/15/22 1108 11/15/22 1312  TROPONINIHS 9 36*     Chemistry Recent Labs  Lab 11/19/22 0346 11/19/22 1633 11/20/22 0354 11/21/22 0127  NA 135 132* 131* 131*  K 4.5 3.7 4.0 3.7  CL 105 99 98 101  CO2 19* 23 25 25   GLUCOSE 110* 149* 109* 107*  BUN 14 12 19 18   CREATININE 0.84 0.97 0.92 0.77  CALCIUM 8.0* 8.2* 8.3* 8.0*  MG 2.5* 2.3  --   --   GFRNONAA >60 >60 >60 >60  ANIONGAP 11 10 8 5     Lipids  Recent Labs  Lab 11/16/22 0220  CHOL 227*  TRIG 71  HDL 62  LDLCALC 151*  CHOLHDL 3.7    Hematology Recent Labs  Lab 11/19/22 1633 11/20/22 0354 11/21/22 0127  WBC 15.8* 12.3* 11.7*  RBC 3.50* 3.28* 3.32*  HGB 11.1* 10.6* 10.7*  HCT 34.1* 31.5* 30.9*  MCV 97.4 96.0 93.1  MCH 31.7 32.3 32.2  MCHC 32.6 33.7 34.6  RDW 12.6 12.4 12.2  PLT 120* 112* 116*   Thyroid  Recent Labs  Lab 11/15/22 1630  TSH 3.896    BNPNo results for input(s): "BNP", "PROBNP" in the last 168 hours.  DDimer No results for input(s): "DDIMER" in the last 168 hours.   Radiology    DG Chest 2 View  Result Date: 11/21/2022 CLINICAL DATA:  67 year old male status post CABG. EXAM: CHEST - 2 VIEW COMPARISON:  11/20/2022 FINDINGS: Similar appearing postsurgical changes after coronary artery bypass graft. Interval removal of right internal  jugular central venous catheter. Improved aeration of the lungs bilaterally with trace left pleural effusion and left basilar atelectasis. Unchanged trace right apical pneumothorax. No acute osseous abnormality. IMPRESSION: 1. Improved aeration of the lungs bilaterally with trace left pleural effusion and left basilar atelectasis. 2. Unchanged trace right apical pneumothorax. Electronically Signed   By: Marliss Cootsylan  Suttle M.D.   On: 11/21/2022 07:01    Cardiac Studies   LHC 11/16/22:   Conclusions: Severe two-vessel coronary artery disease with sequential 50-70% ostial through mid LAD stenoses that are hemodynamically significant by RFR and chronic total occlusion of dominant LCx with left-to-left and right-to-left collaterals. Mildly reduced left ventricular systolic function (LVEF 45-50%) with global hypokinesis. Mild to moderately elevated left heart filling pressure (LVEDP 20-25 mmHg).   Recommendations: Cardiac surgery consultation for CABG. Aggressive secondary prevention of coronary artery disease. Gentle diuresis; furosemide 20 mg IV x 1 has been ordered. Follow-up echocardiogram.    Echo 11/16/22:   1. Left ventricular ejection fraction, by estimation, is 60 to 65%. The  left ventricle has normal function. Left ventricular endocardial border  not optimally defined to evaluate regional wall motion. Left ventricular  diastolic parameters were normal.   2. Right ventricular systolic function is normal. The right ventricular  size is normal.   3. The mitral valve was not well visualized. No evidence of mitral valve  regurgitation. No evidence of mitral stenosis.   4. The aortic valve is tricuspid. Aortic valve regurgitation is not  visualized. No aortic stenosis is present.   Comparison(s): No prior Echocardiogram.   Patient Profile     67 y.o. male with PMH of obesity, pulmonary asbestosis, presented with prolonged chest pain after abnormal outpatient stress myoview. LHC 4/2 showed  severe two-vessel coronary artery disease of mid LAD and CTO of Lcx with collaterals. Lvef 45-50%. LVEDP 20-5225mmHg. He underwent CABG x3 (LIMA-LAD, SVG-diagonal, SVG-OM1) on 11/19/22. Progressing well.   Assessment & Plan    Unstable angina  CAD Ischemic cardiomyopathy with recovered EF  HTN HLD - presented with prolonged chest pain after abnormal outpatient stress myoview.  - A1C 5.9%, TSH WNL, LDL 151, POA - LHC 4/2 showed severe two-vessel coronary artery disease of mid LAD and CTO of Lcx with collaterals. Lvef 45-50%. LVEDP 20-4025mmHg.  -  follow up Echo 11/16/22 with LVEF improved to 60-65%, prior to CABG, did receive diuresis and currently on PO lasix 40mg  daily per CT surgery  - s/p CABG x3 (LIMA-LAD, SVG-diagonal, SVG-OM1) on 11/19/22, post-op care per CTS, no A fib or CHF decompensation so far clinically  - GDMT: continue ASA 81mg , Plavix 75mg , metoprolol 37.5mg  BID, losartan 25mg  , crestor 40mg  at the time of discharge, may add spironolactone and farxiga at follow up visit if no contraindication, patient agreeable with above meds/tolerating well so far  - post cath care discussed in detail at bedside, discharge written instruction for site care and CHF added to AVS - Follow up arranged with cardiology on 12/09/22      For questions or updates, please contact [facility] Please consult [email or web address] for contact info under        Signed, Cyndi Bender, NP  11/22/2022, 8:45 AM    Patient seen and examined and agree with Cyndi Bender, NP as detailed above.  In brief, the patient is a 68 y.o. male with PMH of obesity, pulmonary asbestosis who presented with prolonged chest pain after abnormal outpatient stress myoview. LHC 4/2 showed severe two-vessel coronary artery disease of mid LAD and CTO of Lcx with collaterals. Lvef 45-50%. LVEDP 20-38mmHg. He underwent CABG x3 (LIMA-LAD, SVG-diagonal, SVG-OM1) on 11/19/22. He is progressing very well and is planned for discharge later  today.  GEN: No acute distress.   Neck: No JVD Cardiac: RRR, no murmurs, rubs, or gallops. Sternotomy site c/d/i  Respiratory: Clear to auscultation bilaterally. GI: Soft, nontender, non-distended  MS: Trace edema, warm Neuro:  Nonfocal  Psych: Normal affect    Plan: -Progressed very well post-CABG and is planned for discharge today -Continue ASA 81mg , plavix 75mg  daily -Continue metop 37.5mg  BID; can change to long-acting as outpatient -Continue losartan 25mg  daily -Continue crestor 40mg  daily -On lasix 40mg  PO daily -Will optimize meds further as outpatient as tolerated -Outpatient follow-up arranged on 12/09/22  Laurance Flatten, MD

## 2022-11-23 ENCOUNTER — Telehealth: Payer: Self-pay | Admitting: Internal Medicine

## 2022-11-23 NOTE — Telephone Encounter (Signed)
Pt's son dropped off FMLA forms for himself   Scanned forms in   son paid $29 cash for payment  Forms placed in folder and put on Dr. Antoine Poche desk for completion.

## 2022-11-24 MED FILL — Sodium Chloride IV Soln 0.9%: INTRAVENOUS | Qty: 3000 | Status: AC

## 2022-11-24 MED FILL — Mannitol IV Soln 20%: INTRAVENOUS | Qty: 250 | Status: AC

## 2022-11-24 MED FILL — Sodium Bicarbonate IV Soln 8.4%: INTRAVENOUS | Qty: 50 | Status: AC

## 2022-11-24 MED FILL — Calcium Chloride Inj 10%: INTRAVENOUS | Qty: 10 | Status: AC

## 2022-11-24 MED FILL — Electrolyte-R (PH 7.4) Solution: INTRAVENOUS | Qty: 4000 | Status: AC

## 2022-11-24 MED FILL — Lidocaine HCl Local Soln Prefilled Syringe 100 MG/5ML (2%): INTRAMUSCULAR | Qty: 10 | Status: AC

## 2022-11-24 MED FILL — Heparin Sodium (Porcine) Inj 1000 Unit/ML: INTRAMUSCULAR | Qty: 20 | Status: AC

## 2022-11-25 ENCOUNTER — Other Ambulatory Visit: Payer: Medicare Other

## 2022-11-26 DIAGNOSIS — M1990 Unspecified osteoarthritis, unspecified site: Secondary | ICD-10-CM | POA: Diagnosis not present

## 2022-11-26 DIAGNOSIS — Z299 Encounter for prophylactic measures, unspecified: Secondary | ICD-10-CM | POA: Diagnosis not present

## 2022-11-26 DIAGNOSIS — I2510 Atherosclerotic heart disease of native coronary artery without angina pectoris: Secondary | ICD-10-CM | POA: Diagnosis not present

## 2022-11-26 DIAGNOSIS — E7800 Pure hypercholesterolemia, unspecified: Secondary | ICD-10-CM | POA: Diagnosis not present

## 2022-11-29 ENCOUNTER — Ambulatory Visit: Payer: Self-pay | Admitting: *Deleted

## 2022-11-29 NOTE — Progress Notes (Signed)
Patient arrived for nurse visit to remove sutures post-CABG 4/4 by Dr. Dorris Fetch.  Two sutures removed with no signs or symptoms of infection noted.  Incisions well approximated.  Patient tolerated suture removal well.  Right lower leg EVH incision assessed. Per patient, incision draining light pink drainage. Incision free from signs and symptoms of infection. Incision dressed with gauze and tape. Patient and family instructed to keep the incision site clean and dry. Patient and family advised to contact our office with incision changes. Patient and family acknowledged instructions given.  All questions answered.

## 2022-12-02 NOTE — Telephone Encounter (Signed)
Completed FMLA forms scanned into system, charge request sent to billing - will fax completed forms to fax # provided on forms.

## 2022-12-09 ENCOUNTER — Ambulatory Visit: Payer: Medicare Other | Attending: Nurse Practitioner | Admitting: Nurse Practitioner

## 2022-12-09 ENCOUNTER — Encounter: Payer: Self-pay | Admitting: Nurse Practitioner

## 2022-12-09 VITALS — BP 122/84 | HR 62 | Ht 72.0 in | Wt 248.0 lb

## 2022-12-09 DIAGNOSIS — Z951 Presence of aortocoronary bypass graft: Secondary | ICD-10-CM | POA: Diagnosis not present

## 2022-12-09 DIAGNOSIS — I2510 Atherosclerotic heart disease of native coronary artery without angina pectoris: Secondary | ICD-10-CM

## 2022-12-09 DIAGNOSIS — I10 Essential (primary) hypertension: Secondary | ICD-10-CM

## 2022-12-09 DIAGNOSIS — D649 Anemia, unspecified: Secondary | ICD-10-CM | POA: Diagnosis not present

## 2022-12-09 DIAGNOSIS — Z79899 Other long term (current) drug therapy: Secondary | ICD-10-CM | POA: Diagnosis not present

## 2022-12-09 DIAGNOSIS — E871 Hypo-osmolality and hyponatremia: Secondary | ICD-10-CM | POA: Diagnosis not present

## 2022-12-09 DIAGNOSIS — E785 Hyperlipidemia, unspecified: Secondary | ICD-10-CM | POA: Diagnosis not present

## 2022-12-09 DIAGNOSIS — I214 Non-ST elevation (NSTEMI) myocardial infarction: Secondary | ICD-10-CM | POA: Diagnosis not present

## 2022-12-09 MED ORDER — METOPROLOL TARTRATE 37.5 MG PO TABS: 37.5000 mg | ORAL_TABLET | Freq: Two times a day (BID) | ORAL | 1 refills | Status: DC

## 2022-12-09 MED ORDER — CLOPIDOGREL BISULFATE 75 MG PO TABS: 75.0000 mg | ORAL_TABLET | Freq: Every day | ORAL | 1 refills | Status: DC

## 2022-12-09 NOTE — Patient Instructions (Addendum)
Medication Instructions:  Your physician recommends that you continue on your current medications as directed. Please refer to the Current Medication list given to you today.  Medications refilled today.  Labwork: CBC, BMET- due in 1 week @ PCP-[facility]  Testing/Procedures: none  Follow-Up:  Your physician recommends that you schedule a follow-up appointment in: 2-3 months  Any Other Special Instructions Will Be Listed Below (If Applicable).  If you need a refill on your cardiac medications before your next appointment, please call your pharmacy.

## 2022-12-09 NOTE — Progress Notes (Signed)
Office Visit    Patient Name: Christian Davis Date of Encounter: 12/09/2022  PCP:  Kirstie Peri, MD   [facility]  Cardiologist:  Marjo Bicker, MD  Advanced Practice Provider:  No care team member to display Electrophysiologist:  None   Chief Complaint    Christian Davis is a 67 y.o. male with a hx of pulmonary asbestosis, CAD, s/p NSTEMI and CABG x 3, GERD, hx of kidney stones, who presents today for post CABG follow-up.    Past Medical History    Past Medical History:  Diagnosis Date   Arthritis    Cancer [facility])    Coronary artery calcification seen on CT scan    Depression    GERD (gastroesophageal reflux disease)    H/O asbestos exposure    History of kidney stones    Past Surgical History:  Procedure Laterality Date   COLONOSCOPY WITH PROPOFOL N/A 08/23/2022   Procedure: COLONOSCOPY WITH PROPOFOL;  Surgeon: Lanelle Bal, DO;  Location: [facility] ENDO SUITE;  Service: Endoscopy;  Laterality: N/A;  9:45am, asa 3   CORONARY ARTERY BYPASS GRAFT N/A 11/18/2022   Procedure: CORONARY ARTERY BYPASS GRAFTING (CABG) x THREE BYPASSES USING OPEN LEFT INTERNAL MAMMARY ARTERY AND OPEN HARVESTED RIGHT GREATER SAPHENOUS VEIN;  Surgeon: Loreli Slot, MD;  Location: [facility] OR;  Service: Open Heart Surgery;  Laterality: N/A;   CORONARY PRESSURE/FFR STUDY N/A 11/16/2022   Procedure: INTRAVASCULAR PRESSURE WIRE/FFR STUDY;  Surgeon: Yvonne Kendall, MD;  Location: [facility] INVASIVE CV LAB;  Service: Cardiovascular;  Laterality: N/A;   FRACTURE SURGERY Left 2002   LEFT HEART CATH AND CORONARY ANGIOGRAPHY N/A 11/16/2022   Procedure: LEFT HEART CATH AND CORONARY ANGIOGRAPHY;  Surgeon: Yvonne Kendall, MD;  Location: [facility] INVASIVE CV LAB;  Service: Cardiovascular;  Laterality: N/A;   NECK SURGERY  1990   POLYPECTOMY  08/23/2022   Procedure: POLYPECTOMY;  Surgeon: Lanelle Bal, DO;  Location: [facility] ENDO [facility]ITE;  Service: Endoscopy;;   RADIAL ARTERY HARVEST Left 11/18/2022    Procedure: ATTEMPTED RADIAL ARTERY HARVEST;  Surgeon: Loreli Slot, MD;  Location: [facility] OR;  Service: Open Heart Surgery;  Laterality: Left;   TEE WITHOUT CARDIOVERSION N/A 11/18/2022   Procedure: TRANSESOPHAGEAL ECHOCARDIOGRAM;  Surgeon: Loreli Slot, MD;  Location: [facility] OR;  Service: Open Heart Surgery;  Laterality: N/A;    Allergies  Allergies  Allergen Reactions   Codeine Anxiety    History of Present Illness    Christian Davis is a 67 y.o. male with a PMH as mentioned above.  Evaluated by Dr. Ancil Boozer on November 11, 2022 for chest pressure.  He self-reported that he was diagnosed with pulmonary asbestosis, noted shortness of breath at baseline.  Noted DOE with excessive exertion.  Had been noting exertional substernal chest pressure times several years, around once per week, resolved spontaneously lasting for 5 to 10 minutes.  Denied any other associated symptoms.  Denied smoking cigarettes, reported drinking 2 beers per day.   Lexi scan was arranged and report is noted below, was considered abnormal and high risk.  Was having chest pain after he finished stress test and reported to [facility].  EKG revealed nonspecific ST changes, high sensitive troponin elevated from 9-36, diagnosed with NSTEMI versus unstable angina.  Transferred to [facility] and underwent left heart cath that revealed severe two-vessel CAD, report noted below, recommended for CABG.  EF mildly reduced at 45 to 50%.  Underwent CABG x  3 on November 18, 2022 (LIMA-LAD, SVG-OM, and SVG-diagonal).  Was diuresed postop.  Was started on Plavix due to NSTEMI.  Did have some difficulty controlling blood pressure.  Overall did well postop.  Today he presents for scheduled follow-up post CABG.  He states he is doing well. Denies any chest pain, shortness of breath, palpitations, syncope, presyncope, dizziness, orthopnea, PND, swelling or significant weight changes, acute bleeding, or claudication.  Tolerating his medications well.   EKGs/Labs/Other Studies Reviewed:   The following studies were reviewed today:   EKG:  EKG is not ordered today.    Echo 11/16/2022: 1. Left ventricular ejection fraction, by estimation, is 60 to 65%. The  left ventricle has normal function. Left ventricular endocardial border  not optimally defined to evaluate regional wall motion. Left ventricular  diastolic parameters were normal.   2. Right ventricular systolic function is normal. The right ventricular  size is normal.   3. The mitral valve was not well visualized. No evidence of mitral valve  regurgitation. No evidence of mitral stenosis.   4. The aortic valve is tricuspid. Aortic valve regurgitation is not  visualized. No aortic stenosis is present.   Comparison(s): No prior Echocardiogram.  LHC 11/16/2022: Conclusions: Severe two-vessel coronary artery disease with sequential 50-70% ostial through mid LAD stenoses that are hemodynamically significant by RFR and chronic total occlusion of dominant LCx with left-to-left and right-to-left collaterals. Mildly reduced left ventricular systolic function (LVEF 45-50%) with global hypokinesis. Mild to moderately elevated left heart filling pressure (LVEDP 20-25 mmHg).   Recommendations: Cardiac surgery consultation for CABG. Aggressive secondary prevention of coronary artery disease. Gentle diuresis; furosemide 20 mg IV x 1 has been ordered. Follow-up echocardiogram.   Myoview 11/15/2022:   Stress ECG is borderline positive for ischemia due to findings of 1.0 mm of horizontal ST depression noted in lead II with stress that persisted into recovery.   Patient had substernal chest tightness that occurred in stress, continued into recovery and after nuclear scanning. Chest pain resolved but patient sent to ER due to prolonged episode of chest tightness.   There is a medium sized reversible perfusion defect present in the apical to mid anterolateral and  inferolateral location consistent with ischemia.There is another medium sized reversible defect present in the apical to basal inferior location consistent with ischemia.   Left ventricular function is abnormal. Nuclear stress EF: 51 %. Consider 2D Echocardiogram for accurate estimation of LVEF.   Findings are consistent with ischemia. The study is high risk.  Recent Labs: 11/15/2022: TSH 3.896 11/19/2022: Magnesium 2.3 11/21/2022: BUN 18; Creatinine, Ser 0.77; Hemoglobin 10.7; Platelets 116; Potassium 3.7; Sodium 131  Recent Lipid Panel    Component Value Date/Time   CHOL 227 (H) 11/16/2022 0220   CHOL 206 (H) 01/28/2020 0921   TRIG 71 11/16/2022 0220   HDL 62 11/16/2022 0220   HDL 53 01/28/2020 0921   CHOLHDL 3.7 11/16/2022 0220   VLDL 14 11/16/2022 0220   LDLCALC 151 (H) 11/16/2022 0220   LDLCALC 135 (H) 01/28/2020 0921    Home Medications   Current Meds  Medication Sig   acetaminophen (TYLENOL) 500 MG tablet Take 2 tablets (1,000 mg total) by mouth every 6 (six) hours as needed.   Apoaequorin (PREVAGEN EXTRA STRENGTH) 20 MG CAPS Take 20 mg by mouth daily.   aspirin EC 81 MG tablet Take 1 tablet (81 mg total) by mouth daily. Swallow whole.   azelastine (ASTELIN) 0.1 % nasal spray Place 2 sprays into both  nostrils at bedtime. Use in each nostril as directed   cholecalciferol (VITAMIN D3) 25 MCG (1000 UNIT) tablet Take 1,000 Units by mouth daily.   citalopram (CELEXA) 20 MG tablet Take 20 mg by mouth daily.   cyanocobalamin (VITAMIN B12) 1000 MCG tablet Take 1,000 mcg by mouth daily.   gabapentin (NEURONTIN) 100 MG capsule Take 100 mg by mouth at bedtime.   levocetirizine (XYZAL) 5 MG tablet Take 5 mg by mouth daily.   losartan (COZAAR) 25 MG tablet Take 1 tablet (25 mg total) by mouth daily.   [START ON 12/20/2022] meloxicam (MOBIC) 7.5 MG tablet Take 1 tablet (7.5 mg total) by mouth daily as needed for pain.   Menthol, Topical Analgesic, (BLUE-EMU MAXIMUM STRENGTH EX) Apply 1  Application topically daily as needed (pain).   rosuvastatin (CRESTOR) 40 MG tablet Take 1 tablet (40 mg total) by mouth daily.   traMADol (ULTRAM) 50 MG tablet Take 1 tablet (50 mg total) by mouth every 6 (six) hours as needed for moderate pain.   clopidogrel (PLAVIX) 75 MG tablet Take 1 tablet (75 mg total) by mouth daily.    Metoprolol Tartrate 37.5 MG TABS Take 1 tablet (37.5 mg total) by mouth 2 (two) times daily.     Review of Systems    All other systems reviewed and are otherwise negative except as noted above.  Physical Exam    VS:  BP 122/84 (BP Location: Right Arm, Patient Position: Sitting, Cuff Size: Normal)   Pulse 62   Ht 6' (1.829 m)   Wt 248 lb (112.5 kg)   SpO2 95%   BMI 33.63 kg/m  , BMI Body mass index is 33.63 kg/m.  Wt Readings from Last 3 Encounters:  12/09/22 248 lb (112.5 kg)  11/22/22 258 lb 9.6 oz (117.3 kg)  11/11/22 266 lb 3.2 oz (120.7 kg)     GEN: Obese, 67 y.o. male in no acute distress. HEENT: normal. Neck: Supple, no JVD, carotid bruits, or masses. Cardiac: S1/S2, RRR, no murmurs, rubs, or gallops. No clubbing, cyanosis, edema.  Radials/PT 2+ and equal bilaterally.  Respiratory:  Respirations regular and unlabored, clear to auscultation bilaterally. MS: No deformity or atrophy. Skin: Warm and dry, no rash. Neuro:  Strength and sensation are intact. Psych: Normal affect.  Assessment & Plan    CAD, s/p NSTEMI and CABG x 3, post-op anemia/hyponatremia, medication management Stable with no anginal symptoms. No indication for ischemic evaluation. Continue ASA, Plavix, Losartan, Lopressor, and rosuvastatin. Will provide refills per his request. Heart healthy diet and regular cardiovascular exercise encouraged. Okay to start cardiac rehab. Did have mild anemia and hyponatremia post CABG. Will recheck CBC and BMET.    Cardiac Rehabilitation Eligibility Assessment  The patient is ready to start cardiac rehabilitation from a cardiac standpoint.    2. HTN BP well controlled. Discussed to monitor BP at home at least 2 hours after medications and sitting for 5-10 minutes. Continue current meds. Heart healthy diet and regular cardiovascular exercise encouraged.   3. HLD Continue crestor. Heart healthy diet and regular cardiovascular exercise encouraged.   Disposition: Follow up in 2-3 month(s) with Vishnu P Mallipeddi, MD or APP.  Signed, Sharlene Dory, NP 12/11/2022, 9:47 PM [facility]

## 2022-12-13 DIAGNOSIS — D649 Anemia, unspecified: Secondary | ICD-10-CM | POA: Diagnosis not present

## 2022-12-13 DIAGNOSIS — Z79899 Other long term (current) drug therapy: Secondary | ICD-10-CM | POA: Diagnosis not present

## 2022-12-20 ENCOUNTER — Other Ambulatory Visit: Payer: Self-pay | Admitting: Thoracic Surgery (Cardiothoracic Vascular Surgery)

## 2022-12-20 DIAGNOSIS — Z951 Presence of aortocoronary bypass graft: Secondary | ICD-10-CM

## 2022-12-21 ENCOUNTER — Ambulatory Visit
Admission: RE | Admit: 2022-12-21 | Discharge: 2022-12-21 | Disposition: A | Discharge status: Home / Self Care | Payer: Medicare Other | Source: Ambulatory Visit | Attending: Thoracic Surgery (Cardiothoracic Vascular Surgery) | Admitting: Thoracic Surgery (Cardiothoracic Vascular Surgery)

## 2022-12-21 ENCOUNTER — Other Ambulatory Visit: Payer: Self-pay | Admitting: *Deleted

## 2022-12-21 ENCOUNTER — Ambulatory Visit (INDEPENDENT_AMBULATORY_CARE_PROVIDER_SITE_OTHER): Payer: Self-pay | Admitting: Thoracic Surgery (Cardiothoracic Vascular Surgery)

## 2022-12-21 ENCOUNTER — Encounter: Payer: Self-pay | Admitting: Thoracic Surgery (Cardiothoracic Vascular Surgery)

## 2022-12-21 VITALS — BP 111/72 | HR 63 | Resp 18 | Ht 72.0 in | Wt 243.0 lb

## 2022-12-21 DIAGNOSIS — Z951 Presence of aortocoronary bypass graft: Secondary | ICD-10-CM

## 2022-12-21 DIAGNOSIS — Z48812 Encounter for surgical aftercare following surgery on the circulatory system: Secondary | ICD-10-CM | POA: Diagnosis not present

## 2022-12-21 DIAGNOSIS — J90 Pleural effusion, not elsewhere classified: Secondary | ICD-10-CM | POA: Diagnosis not present

## 2022-12-21 MED ORDER — FUROSEMIDE 40 MG PO TABS
40.0000 mg | ORAL_TABLET | Freq: Every day | ORAL | 0 refills | Status: DC
Start: 2022-12-21 — End: 2023-01-17

## 2022-12-21 MED ORDER — PREDNISONE 10 MG (21) PO TBPK: ORAL_TABLET | ORAL | 0 refills | Status: AC

## 2022-12-21 MED ORDER — POTASSIUM CHLORIDE CRYS ER 20 MEQ PO TBCR: 20.0000 meq | EXTENDED_RELEASE_TABLET | Freq: Every day | ORAL | 0 refills | Status: DC

## 2022-12-21 NOTE — Progress Notes (Signed)
[address]       [name] [address]             [phone]     HPI: Mr. Bellaire returns for follow-up after recent coronary bypass grafting.  Carwyn Lacina is a 67 year old man with a history of asbestos exposure, generalized anxiety disorder, night terrors, hyperlipidemia, CAD, and non-ST elevation MI.  He presented with chest pain.  At catheterization he was found to have severe two-vessel coronary disease.  He underwent coronary bypass grafting x 3 on 11/18/2022.  His left radial was too small to use.  His postoperative course was uncomplicated and he went home on day 4.  He only took about 3 pain pills after going home.  He is not using them at all currently.  Has noticed some swelling in his legs.  No shortness of breath.  No recurrent angina.  Overall feels well.  Past Medical History:  Diagnosis Date   Arthritis    Cancer (HCC)    Coronary artery calcification seen on CT scan    Depression    GERD (gastroesophageal reflux disease)    H/O asbestos exposure    History of kidney stones     Current Outpatient Medications  Medication Sig Dispense Refill   acetaminophen (TYLENOL) 500 MG tablet Take 2 tablets (1,000 mg total) by mouth every 6 (six) hours as needed. 30 tablet 0   Apoaequorin (PREVAGEN EXTRA STRENGTH) 20 MG CAPS Take 20 mg by mouth daily.     aspirin EC 81 MG tablet Take 1 tablet (81 mg total) by mouth daily. Swallow whole. 30 tablet 12   azelastine (ASTELIN) 0.1 % nasal spray Place 2 sprays into both nostrils at bedtime. Use in each nostril as directed     cholecalciferol (VITAMIN D3) 25 MCG (1000 UNIT) tablet Take 1,000 Units by mouth daily.     citalopram (CELEXA) 20 MG tablet Take 20 mg by mouth daily.     clopidogrel (PLAVIX) 75 MG tablet Take 1 tablet (75 mg total) by mouth daily. 90 tablet 1   cyanocobalamin (VITAMIN B12) 1000 MCG tablet Take 1,000 mcg by mouth daily.     furosemide (LASIX) 40 MG tablet Take 1 tablet (40 mg total) by  mouth daily. 14 tablet 0   gabapentin (NEURONTIN) 100 MG capsule Take 100 mg by mouth at bedtime.     levocetirizine (XYZAL) 5 MG tablet Take 5 mg by mouth daily.     losartan (COZAAR) 25 MG tablet Take 1 tablet (25 mg total) by mouth daily. 30 tablet 3   meloxicam (MOBIC) 7.5 MG tablet Take 1 tablet (7.5 mg total) by mouth daily as needed for pain.     Menthol, Topical Analgesic, (BLUE-EMU MAXIMUM STRENGTH EX) Apply 1 Application topically daily as needed (pain).     Metoprolol Tartrate 37.5 MG TABS Take 1 tablet (37.5 mg total) by mouth 2 (two) times daily. 180 tablet 1   potassium chloride (KLOR-CON M) 20 MEQ tablet Take 1 tablet (20 mEq total) by mouth daily. 14 tablet 0   predniSONE (STERAPRED UNI-PAK 21 TAB) 10 MG (21) TBPK tablet Take 6 tablets (60 mg total) by mouth daily for 1 day, THEN 5 tablets (50 mg total) daily for 1 day, THEN 4 tablets (40 mg total) daily for 1 day, THEN 3 tablets (30 mg total) daily for 1 day, THEN 2 tablets (20 mg total) daily for 1 day, THEN 1 tablet (10 mg total) daily  for 1 day. 21 tablet 0   rosuvastatin (CRESTOR) 40 MG tablet Take 1 tablet (40 mg total) by mouth daily. 30 tablet 3   No current facility-administered medications for this visit.    Physical Exam BP 111/72 (BP Location: Right Arm, Patient Position: Sitting)   Pulse 63   Resp 18   Ht 6' (1.829 m)   Wt 243 lb (110.2 kg)   SpO2 98% Comment: RA  BMI 32.38 kg/m  67 year old man in no acute distress Alert and oriented x 3 with no focal deficits Lungs very slightly diminished at left base but otherwise clear Cardiac regular rate and rhythm Sternum stable, incision clean dry and intact 2+ edema right leg, 1+ left leg, incisions intact right leg  Diagnostic Tests: I personally reviewed his chest x-ray.  Shows postoperative changes with a small left effusion.  Impression: [place] Suhy is a 67 year old man with a history of asbestos exposure, generalized anxiety disorder, night terrors,  hyperlipidemia, CAD, and non-ST elevation MI.   CAD-status post non-ST elevation MI.  Status post coronary bypass grafting x 3.  On aspirin, Plavix, and statin.  Status post CABG-he is now about a month out from surgery.  He is doing well.  Has minimal discomfort.  Okay to begin driving.  Appropriate precautions were discussed.  She will avoid lifting over 10 pounds until after [facility] Day.  After that his activities are unrestricted.  I do think he is fine to start cardiac rehab.  Left pleural effusion-has a small persistent left pleural effusion.  Also has a little bit of swelling in his legs.  Will treat him with Lasix and potassium for 2 weeks.  Will also give a prednisone taper.  I will see him back in about 3 weeks to follow-up on that.   Plan: Prednisone taper Lasix 40 mg and potassium 20 mill equivalents daily x 14 days Return in 3 weeks with PA and lateral chest x-ray Okay to start cardiac rehab.  Loreli Slot, MD [facility] [phone]

## 2022-12-21 NOTE — Progress Notes (Signed)
Ambulatory referral placed to [facility] Re[facility] program per Dr. Dorris Fetch.

## 2022-12-27 DIAGNOSIS — I25119 Atherosclerotic heart disease of native coronary artery with unspecified angina pectoris: Secondary | ICD-10-CM | POA: Diagnosis not present

## 2022-12-27 DIAGNOSIS — M25529 Pain in unspecified elbow: Secondary | ICD-10-CM | POA: Diagnosis not present

## 2022-12-27 DIAGNOSIS — R413 Other amnesia: Secondary | ICD-10-CM | POA: Diagnosis not present

## 2022-12-27 DIAGNOSIS — Z299 Encounter for prophylactic measures, unspecified: Secondary | ICD-10-CM | POA: Diagnosis not present

## 2022-12-29 DIAGNOSIS — R413 Other amnesia: Secondary | ICD-10-CM | POA: Diagnosis not present

## 2023-01-12 DIAGNOSIS — Z299 Encounter for prophylactic measures, unspecified: Secondary | ICD-10-CM | POA: Diagnosis not present

## 2023-01-12 DIAGNOSIS — R413 Other amnesia: Secondary | ICD-10-CM | POA: Diagnosis not present

## 2023-01-17 ENCOUNTER — Telehealth (HOSPITAL_COMMUNITY): Payer: Self-pay | Admitting: *Deleted

## 2023-01-17 ENCOUNTER — Other Ambulatory Visit: Payer: Self-pay | Admitting: Thoracic Surgery (Cardiothoracic Vascular Surgery)

## 2023-01-17 DIAGNOSIS — Z951 Presence of aortocoronary bypass graft: Secondary | ICD-10-CM

## 2023-01-17 NOTE — Telephone Encounter (Signed)
Cardiac Rehab Medication Review by a Nurse   Does the patient  feel that his/her medications are working for him/her?  yes   Has the patient been experiencing any side effects to the medications prescribed?  no   Does the patient measure his/her own blood pressure or blood glucose at home?  yes    Does the patient have any problems obtaining medications due to transportation or finances?   no   Understanding of regimen: good Understanding of indications: good Potential of compliance: good     Verified medication via phone with patient. Nurse comments:     

## 2023-01-18 ENCOUNTER — Ambulatory Visit (INDEPENDENT_AMBULATORY_CARE_PROVIDER_SITE_OTHER): Payer: Self-pay | Admitting: Physician Assistant

## 2023-01-18 ENCOUNTER — Ambulatory Visit
Admission: RE | Admit: 2023-01-18 | Discharge: 2023-01-18 | Disposition: A | Discharge status: Home / Self Care | Payer: Medicare Other | Source: Ambulatory Visit | Attending: Thoracic Surgery (Cardiothoracic Vascular Surgery) | Admitting: Thoracic Surgery (Cardiothoracic Vascular Surgery)

## 2023-01-18 ENCOUNTER — Ambulatory Visit: Payer: Medicare Other | Admitting: Thoracic Surgery (Cardiothoracic Vascular Surgery)

## 2023-01-18 VITALS — BP 134/74 | HR 56 | Resp 20 | Ht 72.0 in | Wt 245.0 lb

## 2023-01-18 DIAGNOSIS — Z951 Presence of aortocoronary bypass graft: Secondary | ICD-10-CM

## 2023-01-18 DIAGNOSIS — J90 Pleural effusion, not elsewhere classified: Secondary | ICD-10-CM | POA: Diagnosis not present

## 2023-01-18 MED ORDER — POTASSIUM CHLORIDE CRYS ER 20 MEQ PO TBCR: 20.0000 meq | EXTENDED_RELEASE_TABLET | Freq: Every day | ORAL | 0 refills | Status: DC

## 2023-01-18 MED ORDER — FUROSEMIDE 40 MG PO TABS
40.0000 mg | ORAL_TABLET | Freq: Every day | ORAL | 0 refills | Status: DC
Start: 2023-01-18 — End: 2023-03-10

## 2023-01-18 MED ORDER — PREDNISONE 10 MG (21) PO TBPK
ORAL_TABLET | ORAL | 0 refills | Status: DC
Start: 2023-01-18 — End: 2023-02-09

## 2023-01-18 MED ORDER — CLOPIDOGREL BISULFATE 75 MG PO TABS: 75.0000 mg | ORAL_TABLET | Freq: Every day | ORAL | 1 refills | Status: DC

## 2023-01-18 MED ORDER — METOPROLOL TARTRATE 37.5 MG PO TABS: 37.5000 mg | ORAL_TABLET | Freq: Two times a day (BID) | ORAL | 1 refills | Status: DC

## 2023-01-18 NOTE — Progress Notes (Signed)
[address]       [name] [address]             [phone]     HPI:  Christian Davis is a 67 year old man with a history of asbestos exposure, generalized anxiety disorder, night terrors, hyperlipidemia, CAD, and non-ST elevation MI. The patient returns for routine postoperative follow-up having undergone CABG x 3 (LIMA to LAD, SVG to OM and SVG to Diagonal, left radial artery was too small to use) by Dr. Dorris Fetch on 11/18/22. He had a routine postoperative recovery and was discharged on 11/22/22. He was seen by Dr. Dorris Fetch on 12/21/22 and was progressing well but had a persistent left pleural effusion. He was given 2 weeks of Lasix and started on a Prednisone taper.   Today he denies chest pain, chest tightness, shortness of breath, dizziness and LOC. He reports he still has some chest soreness on the left side of his incision but no longer requires pain medication. He is ambulating well and is looking forward to cardiac rehab.  Current Outpatient Medications  Medication Sig Dispense Refill   acetaminophen (TYLENOL) 500 MG tablet Take 2 tablets (1,000 mg total) by mouth every 6 (six) hours as needed. 30 tablet 0   aspirin EC 81 MG tablet Take 1 tablet (81 mg total) by mouth daily. Swallow whole. 30 tablet 12   azelastine (ASTELIN) 0.1 % nasal spray Place 2 sprays into both nostrils at bedtime. Use in each nostril as directed     cholecalciferol (VITAMIN D3) 25 MCG (1000 UNIT) tablet Take 1,000 Units by mouth daily.     citalopram (CELEXA) 20 MG tablet Take 20 mg by mouth daily.     clopidogrel (PLAVIX) 75 MG tablet Take 1 tablet (75 mg total) by mouth daily. 90 tablet 1   cyanocobalamin (VITAMIN B12) 1000 MCG tablet Take 1,000 mcg by mouth daily.     levocetirizine (XYZAL) 5 MG tablet Take 5 mg by mouth daily.     losartan (COZAAR) 25 MG tablet Take 1 tablet (25 mg total) by mouth daily. 30 tablet 3   meloxicam (MOBIC) 7.5 MG tablet Take 1 tablet (7.5 mg total)  by mouth daily as needed for pain.     Menthol, Topical Analgesic, (BLUE-EMU MAXIMUM STRENGTH EX) Apply 1 Application topically daily as needed (pain).     Metoprolol Tartrate 37.5 MG TABS Take 1 tablet (37.5 mg total) by mouth 2 (two) times daily. 180 tablet 1   rosuvastatin (CRESTOR) 40 MG tablet Take 1 tablet (40 mg total) by mouth daily. 30 tablet 3   No current facility-administered medications for this visit.   Vitals: Today's Vitals   01/18/23 1400  BP: 134/74  Pulse: (!) 56  Resp: 20  SpO2: 100%  Weight: 245 lb (111.1 kg)  Height: 6' (1.829 m)   Body mass index is 33.23 kg/m. Physical Exam: General: No acute distress  Neuro: Grossly intact CV: Regular rate and rhythm, no murmur Pulm: Clear to auscultation bilaterally GI: +BS, nontender Extremities: Trace edema Wound: Well healed  Diagnostic Tests: CLINICAL DATA:  Post CABG   EXAM: CHEST - 2 VIEW   COMPARISON:  Chest x-ray dated Dec 21, 2022   FINDINGS: Cardiac and mediastinal contours are unchanged. Status post median sternotomy and CABG. Small left pleural effusion is slightly decreased in size when compared with the prior exam. Bibasilar atelectasis. No evidence of pneumothorax.   IMPRESSION: Small left pleural effusion is slightly decreased in  size when compared with the prior exam.     Electronically Signed   By: Allegra Lai M.D.   On: 01/18/2023 13:55  Assessment and Plan: Christian Davis is about 2 months out from CABG and is doing very well. He denies CP, SOB, and dizziness. He reports he has started driving and is looking forward to cardiac rehab. Discussed he no longer has a 10lb weight restriction but to slowly increase activity and lifting as tolerated with no heavy lifting until 3 months out of surgery. Will continue current medications. On CXR he has a small left pleural effusion that has decreased in size. As discussed with Dr. Dorris Fetch we will start him on another Prednisone taper and 2  weeks of Lasix. We will plan to follow up with him in 1 month with CXR.    Jenny Reichmann, PA-C [facility] [phone]

## 2023-01-20 ENCOUNTER — Encounter (HOSPITAL_COMMUNITY): Payer: Self-pay

## 2023-01-20 ENCOUNTER — Encounter (HOSPITAL_COMMUNITY)
Admission: RE | Admit: 2023-01-20 | Discharge: 2023-01-20 | Disposition: A | Discharge status: Home / Self Care | Payer: Medicare Other | Source: Ambulatory Visit | Attending: Thoracic Surgery (Cardiothoracic Vascular Surgery) | Admitting: Thoracic Surgery (Cardiothoracic Vascular Surgery)

## 2023-01-20 VITALS — BP 112/72 | HR 56 | Ht 71.0 in | Wt 245.8 lb

## 2023-01-20 DIAGNOSIS — Z951 Presence of aortocoronary bypass graft: Secondary | ICD-10-CM | POA: Diagnosis not present

## 2023-01-20 DIAGNOSIS — I214 Non-ST elevation (NSTEMI) myocardial infarction: Secondary | ICD-10-CM | POA: Insufficient documentation

## 2023-01-20 NOTE — Progress Notes (Signed)
Cardiac Individual Treatment Plan  Patient Details  Name: Christian Davis MRN: [id number] Date of Birth: June 06, 1956 Referring Provider:   Flowsheet Row CARDIAC REHAB PHASE II ORIENTATION from 01/20/2023 in [facility] CARDIAC REHABILITATION  Referring Provider Dr. Dorris Fetch       Initial Encounter Date:  Flowsheet Row CARDIAC REHAB PHASE II ORIENTATION from 01/20/2023 in [place] CARDIAC REHABILITATION  Date 01/20/23       Visit Diagnosis: S/P CABG x 3  NSTEMI (non-ST elevated myocardial infarction) (HCC)  Patient's Home Medications on Admission:  Current Outpatient Medications:    acetaminophen (TYLENOL) 500 MG tablet, Take 2 tablets (1,000 mg total) by mouth every 6 (six) hours as needed., Disp: 30 tablet, Rfl: 0   aspirin EC 81 MG tablet, Take 1 tablet (81 mg total) by mouth daily. Swallow whole., Disp: 30 tablet, Rfl: 12   azelastine (ASTELIN) 0.1 % nasal spray, Place 2 sprays into both nostrils at bedtime. Use in each nostril as directed, Disp: , Rfl:    cholecalciferol (VITAMIN D3) 25 MCG (1000 UNIT) tablet, Take 1,000 Units by mouth daily., Disp: , Rfl:    citalopram (CELEXA) 20 MG tablet, Take 20 mg by mouth daily., Disp: , Rfl:    clopidogrel (PLAVIX) 75 MG tablet, Take 1 tablet (75 mg total) by mouth daily., Disp: 60 tablet, Rfl: 1   cyanocobalamin (VITAMIN B12) 1000 MCG tablet, Take 1,000 mcg by mouth daily., Disp: , Rfl:    donepezil (ARICEPT ODT) 10 MG disintegrating tablet, Take 10 mg by mouth at bedtime., Disp: , Rfl:    furosemide (LASIX) 40 MG tablet, Take 1 tablet (40 mg total) by mouth daily., Disp: 14 tablet, Rfl: 0   levocetirizine (XYZAL) 5 MG tablet, Take 5 mg by mouth daily., Disp: , Rfl:    losartan (COZAAR) 25 MG tablet, Take 1 tablet (25 mg total) by mouth daily., Disp: 30 tablet, Rfl: 3   meloxicam (MOBIC) 7.5 MG tablet, Take 1 tablet (7.5 mg total) by mouth daily as needed for pain., Disp: , Rfl:    Menthol, Topical Analgesic, (BLUE-EMU MAXIMUM STRENGTH  EX), Apply 1 Application topically daily as needed (pain)., Disp: , Rfl:    Metoprolol Tartrate 37.5 MG TABS, Take 1 tablet (37.5 mg total) by mouth 2 (two) times daily., Disp: 90 tablet, Rfl: 1   potassium chloride SA (KLOR-CON M) 20 MEQ tablet, Take 1 tablet (20 mEq total) by mouth daily., Disp: 14 tablet, Rfl: 0   predniSONE (STERAPRED UNI-PAK 21 TAB) 10 MG (21) TBPK tablet, Take 6 tablets (60 mg total) by mouth daily for 1 day, THEN 5 tablets (50 mg total) daily for 1 day, THEN 4 tablets (40 mg total) daily for 1 day, THEN 3 tablets (30 mg total) daily for 1 day, THEN 2 tablets (20 mg total) daily for 1 day, THEN 1 tablet (10 mg total) daily for 1 day., Disp: 15 tablet, Rfl: 0   rosuvastatin (CRESTOR) 40 MG tablet, Take 1 tablet (40 mg total) by mouth daily., Disp: 30 tablet, Rfl: 3  Past Medical History: Past Medical History:  Diagnosis Date   Arthritis    Cancer (HCC)    Coronary artery calcification seen on CT scan    Depression    GERD (gastroesophageal reflux disease)    H/O asbestos exposure    History of kidney stones     Tobacco Use: Social History   Tobacco Use  Smoking Status Never   Passive exposure: Never  Smokeless Tobacco Never  Labs: Review Flowsheet  More data exists      Latest Ref Rng & Units 10/26/2017 01/28/2020 11/15/2022 11/16/2022 11/18/2022  Labs for ITP Cardiac and Pulmonary Rehab  Cholestrol 0 - 200 mg/dL 914  782  - 956  -  LDL (calc) 0 - 99 mg/dL 213  086  - 578  -  HDL-C >40 mg/dL 50  53  - 62  -  Trlycerides <150 mg/dL 74  469  - 71  -  Hemoglobin A1c 4.8 - 5.6 % - - 5.9  5.9  -  PH, Arterial 7.35 - 7.45 - - - - 7.299  7.286  7.391  7.399  7.371  7.449  7.445  7.396  7.330   PCO2 arterial 32 - 48 mmHg - - - - 41.3  39.8  31.9  34.7  39.4  31.0  31.3  34.8  47.9   Bicarbonate 20.0 - 28.0 mmol/L - - - - 20.0  18.8  19.8  21.4  22.8  21.5  21.5  23.1  21.4  25.3   TCO2 22 - 32 mmol/L - - - - 21  20  21  23  22  24  25  22  24  22  24  22  24  27  27     Acid-base deficit 0.0 - 2.0 mmol/L - - - - 6.0  7.0  5.0  3.0  2.0  2.0  2.0  2.0  3.0  1.0   O2 Saturation % - - - - 97  99  96  100  100  100  100  79  100  100     Capillary Blood Glucose: Lab Results  Component Value Date   GLUCAP 90 11/20/2022   GLUCAP 97 11/20/2022   GLUCAP 94 11/20/2022   GLUCAP 117 (H) 11/19/2022   GLUCAP 123 (H) 11/19/2022     Exercise Target Goals: Exercise Program Goal: Individual exercise prescription set using results from initial 6 min walk test and THRR while considering  patient's activity barriers and safety.   Exercise Prescription Goal: Starting with aerobic activity 30 plus minutes a day, 3 days per week for initial exercise prescription. Provide home exercise prescription and guidelines that participant acknowledges understanding prior to discharge.  Activity Barriers & Risk Stratification:  Activity Barriers & Cardiac Risk Stratification - 01/20/23 0900       Activity Barriers & Cardiac Risk Stratification   Activity Barriers Neck/Spine Problems    Cardiac Risk Stratification High             6 Minute Walk:  6 Minute Walk     Row Name 01/20/23 1011         6 Minute Walk   Phase Initial     Distance 1000 feet     Walk Time 6 minutes     # of Rest Breaks 0     MPH 1.89     METS 1.8     RPE 11     VO2 Peak 6.3     Symptoms No     Resting HR 56 bpm     Resting BP 112/72     Resting Oxygen Saturation  96 %     Exercise Oxygen Saturation  during 6 min walk 92 %     Max Ex. HR 68 bpm     Max Ex. BP 120/70     2 Minute Post BP 116/70  Oxygen Initial Assessment:   Oxygen Re-Evaluation:   Oxygen Discharge (Final Oxygen Re-Evaluation):   Initial Exercise Prescription:  Initial Exercise Prescription - 01/20/23 1000       Date of Initial Exercise RX and Referring Provider   Date 01/20/23    Referring Provider Dr. Dorris Fetch    Expected Discharge Date 04/13/23      Treadmill   MPH 1    Grade  0    Minutes 17      NuStep   Level 1    SPM 50    Minutes 22      Prescription Details   Frequency (times per week) 3    Duration Progress to 30 minutes of continuous aerobic without signs/symptoms of physical distress      Intensity   THRR 40-80% of Max Heartrate 62-124    Ratings of Perceived Exertion 11-13      Resistance Training   Training Prescription Yes    Weight 3    Reps 10-15             Perform Capillary Blood Glucose checks as needed.  Exercise Prescription Changes:   Exercise Comments:   Exercise Goals and Review:   Exercise Goals     Row Name 01/20/23 1015             Exercise Goals   Increase Physical Activity Yes       Intervention Provide advice, education, support and counseling about physical activity/exercise needs.;Develop an individualized exercise prescription for aerobic and resistive training based on initial evaluation findings, risk stratification, comorbidities and participant's personal goals.       Expected Outcomes Short Term: Attend rehab on a regular basis to increase amount of physical activity.;Long Term: Add in home exercise to make exercise part of routine and to increase amount of physical activity.;Long Term: Exercising regularly at least 3-5 days a week.       Increase Strength and Stamina Yes       Intervention Provide advice, education, support and counseling about physical activity/exercise needs.;Develop an individualized exercise prescription for aerobic and resistive training based on initial evaluation findings, risk stratification, comorbidities and participant's personal goals.       Expected Outcomes Short Term: Increase workloads from initial exercise prescription for resistance, speed, and METs.;Short Term: Perform resistance training exercises routinely during rehab and add in resistance training at home;Long Term: Improve cardiorespiratory fitness, muscular endurance and strength as measured by increased METs  and functional capacity ( )       Able to understand and use rate of perceived exertion (RPE) scale Yes       Intervention Provide education and explanation on how to use RPE scale       Expected Outcomes Short Term: Able to use RPE daily in rehab to express subjective intensity level;Long Term:  Able to use RPE to guide intensity level when exercising independently       Knowledge and understanding of Target Heart Rate Range (THRR) Yes       Intervention Provide education and explanation of THRR including how the numbers were predicted and where they are located for reference       Expected Outcomes Short Term: Able to state/look up THRR;Long Term: Able to use THRR to govern intensity when exercising independently;Short Term: Able to use daily as guideline for intensity in rehab       Able to check pulse independently Yes       Intervention Provide education and demonstration  on how to check pulse in carotid and radial arteries.;Review the importance of being able to check your own pulse for safety during independent exercise       Expected Outcomes Short Term: Able to explain why pulse checking is important during independent exercise;Long Term: Able to check pulse independently and accurately       Understanding of Exercise Prescription Yes       Intervention Provide education, explanation, and written materials on patient's individual exercise prescription       Expected Outcomes Short Term: Able to explain program exercise prescription;Long Term: Able to explain home exercise prescription to exercise independently                Exercise Goals Re-Evaluation :    Discharge Exercise Prescription (Final Exercise Prescription Changes):   Nutrition:  Target Goals: Understanding of nutrition guidelines, daily intake of sodium 1500mg , cholesterol 200mg , calories 30% from fat and 7% or less from saturated fats, daily to have 5 or more servings of fruits and vegetables.  Biometrics:   Pre Biometrics - 01/20/23 1015       Pre Biometrics   Height 5\' 11"  (1.803 m)    Weight 111.5 kg    Waist Circumference 46 inches    Hip Circumference 44 inches    Waist to Hip Ratio 1.05 %    BMI (Calculated) 34.3    Triceps Skinfold 24 mm    % Body Fat 34.3 %    Grip Strength 35.6 kg    Flexibility 0 in    Single Leg Stand 3.14 seconds              Nutrition Therapy Plan and Nutrition Goals:  Nutrition Therapy & Goals - 01/20/23 0953       Nutrition Therapy   RD appointment deferred Yes      Personal Nutrition Goals   Comments Patient scored 13 on his diet assessment. Handout explained and provided regarding heatlhier choices. We provide educational sessions on heart healthy nutrition and assistance with RD referral if patient is interested. He declined RD meeting. His wife said they were meeting with a dietician at their PCP's office next week. He says he trys to eat a lot of vegatables.      Intervention Plan   Intervention Nutrition handout(s) given to patient.    Expected Outcomes Short Term Goal: Understand basic principles of dietary content, such as calories, fat, sodium, cholesterol and nutrients.             Nutrition Assessments:  Nutrition Assessments - 01/20/23 0953       MEDFICTS Scores   Pre Score 13            MEDIFICTS Score Key: ?70 Need to make dietary changes  40-70 Heart Healthy Diet ? 40 Therapeutic Level Cholesterol Diet   Picture Your Plate Scores: <14 Unhealthy dietary pattern with much room for improvement. 41-50 Dietary pattern unlikely to meet recommendations for good health and room for improvement. 51-60 More healthful dietary pattern, with some room for improvement.  >60 Healthy dietary pattern, although there may be some specific behaviors that could be improved.    Nutrition Goals Re-Evaluation:   Nutrition Goals Discharge (Final Nutrition Goals Re-Evaluation):   Psychosocial: Target Goals: Acknowledge  presence or absence of significant depression and/or stress, maximize coping skills, provide positive support system. Participant is able to verbalize types and ability to use techniques and skills needed for reducing stress and depression.  Initial Review &  Psychosocial Screening:  Initial Psych Review & Screening - 01/20/23 1004       Initial Review   Current issues with None Identified      Family Dynamics   Good Support System? Yes      Barriers   Psychosocial barriers to participate in program There are no identifiable barriers or psychosocial needs.;The patient should benefit from training in stress management and relaxation.      Screening Interventions   Interventions Provide feedback about the scores to participant    Expected Outcomes Short Term goal: Utilizing psychosocial counselor, staff and physician to assist with identification of specific Stressors or current issues interfering with healing process. Setting desired goal for each stressor or current issue identified.;Short Term goal: Identification and review with participant of any Quality of Life or Depression concerns found by scoring the questionnaire.             Quality of Life Scores:  Quality of Life - 01/20/23 1016       Quality of Life   Select Quality of Life      Quality of Life Scores   Health/Function Pre 22.18 %    Socioeconomic Pre 24 %    Psych/Spiritual Pre 28.29 %    Family Pre 26.4 %    GLOBAL Pre 24.55 %            Scores of 19 and below usually indicate a poorer quality of life in these areas.  A difference of  2-3 points is a clinically meaningful difference.  A difference of 2-3 points in the total score of the Quality of Life Index has been associated with significant improvement in overall quality of life, self-image, physical symptoms, and general health in studies assessing change in quality of life.  PHQ-9: Review Flowsheet       01/20/2023 02/08/2020 10/24/2017 11/22/2016   Depression screen PHQ 2/9  Decreased Interest 0 0 2 0  Down, Depressed, Hopeless 0 0 0 0  PHQ - 2 Score 0 0 2 0  Altered sleeping 0 - 0 -  Tired, decreased energy 0 - 2 -  Change in appetite 0 - 0 -  Feeling bad or failure about yourself  0 - 0 -  Trouble concentrating 0 - 1 -  Moving slowly or fidgety/restless 0 - 0 -  Suicidal thoughts 0 - 0 -  PHQ-9 Score 0 - 5 -  Difficult doing work/chores Not difficult at all - Somewhat difficult -   Interpretation of Total Score  Total Score Depression Severity:  1-4 = Minimal depression, 5-9 = Mild depression, 10-14 = Moderate depression, 15-19 = Moderately severe depression, 20-27 = Severe depression   Psychosocial Evaluation and Intervention:  Psychosocial Evaluation - 01/20/23 1015       Psychosocial Evaluation & Interventions   Interventions Relaxation education;Stress management education;Encouraged to exercise with the program and follow exercise prescription    Comments Patient has no psychosocial barriers or issues identifiied at his orientation visit. His PHQ-9 score was 0. His is accompained by his wife today. He denies any depression or anxiety. He does have short term memory loss and has recently started Donepezil 10 mg daily. He has a good support system with his wife and several friends. They have a son that lives in [place] and one grandson. He is retired from [employer] 10 years ago and he says his job was very physical but when he retired he has not been very active. 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 He is ready to start the program hoping to get stronger and live a healthier life.    Expected Outcomes Patient will continue ot have no psychosocial barriers identified.    Continue Psychosocial Services  No Follow up required             Psychosocial Re-Evaluation:   Psychosocial Discharge (Final Psychosocial Re-Evaluation):   Vocational Rehabilitation: Provide vocational  rehab assistance to qualifying candidates.   Vocational Rehab Evaluation & Intervention:  Vocational Rehab - 01/20/23 0957       Initial Vocational Rehab Evaluation & Intervention   Assessment shows need for Vocational Rehabilitation No      Vocational Rehab Re-Evaulation   Comments Patient is retired and does not need vocational rehab.             Education: Education Goals: Education classes will be provided on a weekly basis, covering required topics. Participant will state understanding/return demonstration of topics presented.  Learning Barriers/Preferences:  Learning Barriers/Preferences - 01/20/23 1023       Learning Barriers/Preferences   Learning Barriers None    Learning Preferences Audio             Education Topics: Hypertension, Hypertension Reduction -Define heart disease and high blood pressure. Discus how high blood pressure affects the body and ways to reduce high blood pressure.   Exercise and Your Heart -Discuss why it is important to exercise, the FITT principles of exercise, normal and abnormal responses to exercise, and how to exercise safely.   Angina -Discuss definition of angina, causes of angina, treatment of angina, and how to decrease risk of having angina.   Cardiac Medications -Review what the following cardiac medications are used for, how they affect the body, and side effects that may occur when taking the medications.  Medications include Aspirin, Beta blockers, calcium channel blockers, ACE Inhibitors, angiotensin receptor blockers, diuretics, digoxin, and antihyperlipidemics.   Congestive Heart Failure -Discuss the definition of CHF, how to live with CHF, the signs and symptoms of CHF, and how keep track of weight and sodium intake.   Heart Disease and Intimacy -Discus the effect sexual activity has on the heart, how changes occur during intimacy as we age, and safety during sexual activity.   Smoking Cessation /  COPD -Discuss different methods to quit smoking, the health benefits of quitting smoking, and the definition of COPD.   Nutrition I: Fats -Discuss the types of cholesterol, what cholesterol does to the heart, and how cholesterol levels can be controlled.   Nutrition II: Labels -Discuss the different components of food labels and how to read food label   Heart Parts/Heart Disease and PAD -Discuss the anatomy of the heart, the pathway of blood circulation through the heart, and these are affected by heart disease.   Stress I: Signs and Symptoms -Discuss the causes of stress, how stress may lead to anxiety and depression, and ways to limit stress.   Stress II: Relaxation -Discuss different types of relaxation techniques to limit stress.   Warning Signs of Stroke / TIA -Discuss definition of a stroke, what the signs and symptoms are of a stroke, and how to identify when someone is having stroke.   Knowledge Questionnaire Score:  Knowledge Questionnaire Score - 01/20/23 0956       Knowledge Questionnaire Score   Pre Score 19/24             Core  Components/Risk Factors/Patient Goals at Admission:  Personal Goals and Risk Factors at Admission - 01/20/23 0957       Core Components/Risk Factors/Patient Goals on Admission    Weight Management Weight Maintenance    Hypertension Yes    Intervention Provide education on lifestyle modifcations including regular physical activity/exercise, weight management, moderate sodium restriction and increased consumption of fresh fruit, vegetables, and low fat dairy, alcohol moderation, and smoking cessation.;Monitor prescription use compliance.    Expected Outcomes Short Term: Continued assessment and intervention until BP is < 140/69mm HG in hypertensive participants. < 130/32mm HG in hypertensive participants with diabetes, heart failure or chronic kidney disease.;Long Term: Maintenance of blood pressure at goal levels.    Lipids Yes     Intervention Provide education and support for participant on nutrition & aerobic/resistive exercise along with prescribed medications to achieve LDL 70mg , HDL >40mg .    Expected Outcomes Short Term: Participant states understanding of desired cholesterol values and is compliant with medications prescribed. Participant is following exercise prescription and nutrition guidelines.;Long Term: Cholesterol controlled with medications as prescribed, with individualized exercise RX and with personalized nutrition plan. Value goals: LDL < 70mg , HDL > 40 mg.    Personal Goal Other Yes    Personal Goal Patient wants to get better and stronger and be able to live a healthier life.    Intervention Patient will attend CR with exercise and education.    Expected Outcomes Patient will complete the program meeting both personal and program goals.             Core Components/Risk Factors/Patient Goals Review:    Core Components/Risk Factors/Patient Goals at Discharge (Final Review):    ITP Comments:   Comments: Patient arrived for 1st visit/orientation/education at 0800. Patient was referred to CR by Dr. Charlett Lango due to S/P CABGx3 (Z95.1) and NSTEMI (I21.4). During orientation advised patient on arrival and appointment times what to wear, what to do before, during and after exercise. Reviewed attendance and class policy.  Pt is scheduled to return Cardiac Rehab on 01/24/23 at 930. Pt was advised to come to class 15 minutes before class starts.  Discussed RPE/Dpysnea scales. Patient participated in warm up stretches. Patient was able to complete 6 minute walk test.  Telemetry:NSR. Patient was measured for the equipment. Discussed equipment safety with patient. Took patient pre-anthropometric measurements. Patient finished visit at 1000.

## 2023-01-21 DIAGNOSIS — J302 Other seasonal allergic rhinitis: Secondary | ICD-10-CM | POA: Diagnosis not present

## 2023-01-21 DIAGNOSIS — Z299 Encounter for prophylactic measures, unspecified: Secondary | ICD-10-CM | POA: Diagnosis not present

## 2023-01-21 DIAGNOSIS — I25119 Atherosclerotic heart disease of native coronary artery with unspecified angina pectoris: Secondary | ICD-10-CM | POA: Diagnosis not present

## 2023-01-21 DIAGNOSIS — Z0000 Encounter for general adult medical examination without abnormal findings: Secondary | ICD-10-CM | POA: Diagnosis not present

## 2023-01-24 ENCOUNTER — Other Ambulatory Visit: Payer: Self-pay | Admitting: Thoracic Surgery (Cardiothoracic Vascular Surgery)

## 2023-01-24 ENCOUNTER — Encounter (HOSPITAL_COMMUNITY)
Admission: RE | Admit: 2023-01-24 | Discharge: 2023-01-24 | Disposition: A | Discharge status: Home / Self Care | Payer: Medicare Other | Source: Ambulatory Visit | Attending: Internal Medicine | Admitting: Internal Medicine

## 2023-01-24 DIAGNOSIS — I214 Non-ST elevation (NSTEMI) myocardial infarction: Secondary | ICD-10-CM

## 2023-01-24 DIAGNOSIS — Z951 Presence of aortocoronary bypass graft: Secondary | ICD-10-CM

## 2023-01-24 NOTE — Progress Notes (Signed)
Daily Session Note  Patient Details  Name: Christian Davis MRN: [id number] Date of Birth: 08-07-1956 Referring Provider:   Flowsheet Row CARDIAC REHAB PHASE II ORIENTATION from 01/20/2023 in [facility] CARDIAC REHABILITATION  Referring Provider Dr. Dorris Fetch       Encounter Date: 01/24/2023  Check In:  Session Check In - 01/24/23 0922       Check-In   Supervising physician immediately available to respond to emergencies [facility] MD immediately available    Physician(s) Dr. Lequita Asal    Location [facility]    Staff Present [name], BS, Exercise Physiologist;[name], RN, BSN    Virtual Visit No    Medication changes reported     No    Fall or balance concerns reported    Yes    Comments Patient says he loses his balance at times.    Tobacco Cessation No Change    Warm-up and Cool-down Performed as group-led instruction    Resistance Training Performed Yes    VAD Patient? No    PAD/SET Patient? No      Pain Assessment   Currently in Pain? No/denies    Pain Score 0-No pain    Multiple Pain Sites No             Capillary Blood Glucose: No results found for this or any previous visit (from the past 24 hour(s)).    Social History   Tobacco Use  Smoking Status Never   Passive exposure: Never  Smokeless Tobacco Never    Goals Met:  Independence with exercise equipment Exercise tolerated well No report of concerns or symptoms today Strength training completed today  Goals Unmet:  Not Applicable  Comments: Check out 1030.   Dr. Dina Rich is Medical Director for [facility] Cardiac Rehab

## 2023-01-26 ENCOUNTER — Encounter (HOSPITAL_COMMUNITY)
Admission: RE | Admit: 2023-01-26 | Discharge: 2023-01-26 | Disposition: A | Discharge status: Home / Self Care | Payer: Medicare Other | Source: Ambulatory Visit | Attending: Internal Medicine | Admitting: Internal Medicine

## 2023-01-26 DIAGNOSIS — I214 Non-ST elevation (NSTEMI) myocardial infarction: Secondary | ICD-10-CM | POA: Diagnosis not present

## 2023-01-26 DIAGNOSIS — Z951 Presence of aortocoronary bypass graft: Secondary | ICD-10-CM

## 2023-01-26 NOTE — Progress Notes (Signed)
Daily Session Note  Patient Details  Name: Christian Davis MRN: [id number] Date of Birth: Jul 15, 1956 Referring Provider:   Flowsheet Row CARDIAC REHAB PHASE II ORIENTATION from 01/20/2023 in [facility] CARDIAC REHABILITATION  Referring Provider Dr. Dorris Fetch       Encounter Date: 01/26/2023  Check In:  Session Check In - 01/26/23 0923       Check-In   Supervising physician immediately available to respond to emergencies [facility] MD immediately available    Physician(s) Dr. Lequita Asal    Location [facility]    Staff Present Rodena Medin, RN, BSN;Hillary Troutman BSN, RN;Heather Fredric Mare, BS, Exercise Physiologist    Virtual Visit No    Medication changes reported     No    Fall or balance concerns reported    Yes    Tobacco Cessation No Change    Warm-up and Cool-down Performed as group-led instruction    Resistance Training Performed Yes    VAD Patient? No    PAD/SET Patient? No      Pain Assessment   Currently in Pain? No/denies    Pain Score 0-No pain    Multiple Pain Sites No             Capillary Blood Glucose: No results found for this or any previous visit (from the past 24 hour(s)).    Social History   Tobacco Use  Smoking Status Never   Passive exposure: Never  Smokeless Tobacco Never    Goals Met:  Independence with exercise equipment Exercise tolerated well No report of concerns or symptoms today Strength training completed today  Goals Unmet:  Not Applicable  Comments: Check out 1030.   Dr. Dina Rich is Medical Director for [facility] Card[facility]

## 2023-01-28 ENCOUNTER — Encounter (HOSPITAL_COMMUNITY)
Admission: RE | Admit: 2023-01-28 | Discharge: 2023-01-28 | Disposition: A | Discharge status: Home / Self Care | Payer: Medicare Other | Source: Ambulatory Visit | Attending: Internal Medicine | Admitting: Internal Medicine

## 2023-01-28 DIAGNOSIS — Z951 Presence of aortocoronary bypass graft: Secondary | ICD-10-CM | POA: Diagnosis not present

## 2023-01-28 DIAGNOSIS — I214 Non-ST elevation (NSTEMI) myocardial infarction: Secondary | ICD-10-CM

## 2023-01-28 NOTE — Progress Notes (Signed)
Daily Session Note  Patient Details  Name: Christian Davis MRN: [id number] Date of Birth: [birth year] Referring Provider:   Flowsheet Row CARDIAC REHAB PHASE II ORIENTATION from 01/20/2023 in [facility] CARDIAC REHABILITATION  Referring Provider Dr. Dorris Fetch       Encounter Date: 01/28/2023  Check In:  Session Check In - 01/28/23 0930       Check-In   Supervising physician immediately available to respond to emergencies [facility] MD immediately available    Physician(s) Dr. Lequita Asal    Location [facility]    Staff Present [name], BS, Exercise Physiologist;Daphyne Daphine Deutscher, RN, BSN    Virtual Visit No    Medication changes reported     No    Fall or balance concerns reported    Yes    Comments Patient says he loses his balance at times.    Tobacco Cessation No Change    Warm-up and Cool-down Performed as group-led instruction    Resistance Training Performed Yes    VAD Patient? No    PAD/SET Patient? No      Pain Assessment   Currently in Pain? No/denies    Pain Score 0-No pain    Multiple Pain Sites No             Capillary Blood Glucose: No results found for this or any previous visit (from the past 24 hour(s)).    Social History   Tobacco Use  Smoking Status Never   Passive exposure: Never  Smokeless Tobacco Never    Goals Met:  Independence with exercise equipment Exercise tolerated well No report of concerns or symptoms today Strength training completed today  Goals Unmet:  Not Applicable  Comments: check out 1030   Dr. Dina Rich is Medical Director for [facility] Card[facility] Rehab

## 2023-01-31 ENCOUNTER — Encounter (HOSPITAL_COMMUNITY)
Admission: RE | Admit: 2023-01-31 | Discharge: 2023-01-31 | Disposition: A | Discharge status: Home / Self Care | Payer: Medicare Other | Source: Ambulatory Visit | Attending: Internal Medicine | Admitting: Internal Medicine

## 2023-01-31 VITALS — Wt 244.9 lb

## 2023-01-31 DIAGNOSIS — Z951 Presence of aortocoronary bypass graft: Secondary | ICD-10-CM | POA: Diagnosis not present

## 2023-01-31 DIAGNOSIS — I214 Non-ST elevation (NSTEMI) myocardial infarction: Secondary | ICD-10-CM

## 2023-01-31 NOTE — Progress Notes (Signed)
Daily Session Note  Patient Details  Name: Christian Davis MRN: [id number] Date of Birth: 11-13-1955 Referring Provider:   Flowsheet Row CARDIAC REHAB PHASE II ORIENTATION from 01/20/2023 in [facility] CARDIAC REHABILITATION  Referring Provider Dr. Dorris Fetch       Encounter Date: 01/31/2023  Check In:  Session Check In - 01/31/23 0930       Check-In   Supervising physician immediately available to respond to emergencies [facility] MD immediately available    Physician(s) Dr. Tenny Craw    Location [facility]    Staff Present [name], BS, Exercise Physiologist;Debra Laural Benes, RN, BSN    Virtual Visit No    Medication changes reported     No    Fall or balance concerns reported    Yes    Comments Patient says he loses his balance at times.    Tobacco Cessation No Change    Warm-up and Cool-down Performed as group-led instruction    Resistance Training Performed Yes    VAD Patient? No    PAD/SET Patient? No      Pain Assessment   Currently in Pain? No/denies    Pain Score 0-No pain    Multiple Pain Sites No             Capillary Blood Glucose: No results found for this or any previous visit (from the past 24 hour(s)).    Social History   Tobacco Use  Smoking Status Never   Passive exposure: Never  Smokeless Tobacco Never    Goals Met:  Independence with exercise equipment Exercise tolerated well No report of concerns or symptoms today Strength training completed today  Goals Unmet:  Not Applicable  Comments: check out 1030   Dr. Dina Rich is Medical Director for [facility] Card[facility] Re[facility]

## 2023-02-02 ENCOUNTER — Encounter (HOSPITAL_COMMUNITY)
Admission: RE | Admit: 2023-02-02 | Discharge: 2023-02-02 | Disposition: A | Discharge status: Home / Self Care | Payer: Medicare Other | Source: Ambulatory Visit | Attending: Internal Medicine | Admitting: Internal Medicine

## 2023-02-02 DIAGNOSIS — I214 Non-ST elevation (NSTEMI) myocardial infarction: Secondary | ICD-10-CM

## 2023-02-02 DIAGNOSIS — Z951 Presence of aortocoronary bypass graft: Secondary | ICD-10-CM | POA: Diagnosis not present

## 2023-02-02 NOTE — Progress Notes (Signed)
Daily Session Note  Patient Details  Name: MECCA PUFF MRN: [id number] Date of Birth: [birth year] Referring Provider:   Flowsheet Row CARDIAC REHAB PHASE II ORIENTATION from 01/20/2023 in [facility] CARDIAC REHABILITATION  Referring Provider Dr. Dorris Fetch       Encounter Date: 02/02/2023  Check In:  Session Check In - 02/02/23 0930       Check-In   Supervising physician immediately available to respond to emergencies [facility] MD immediately available    Physician(s) Dr. Diona Browner    Location [facility]    Staff Present [name], BS, Exercise Physiologist;Melburn Treiber BSN, RN    Virtual Visit No    Medication changes reported     No    Fall or balance concerns reported    Yes    Comments Patient says he loses his balance at times.    Tobacco Cessation No Change    Warm-up and Cool-down Performed as group-led instruction    Resistance Training Performed Yes    VAD Patient? No    PAD/SET Patient? No      Pain Assessment   Currently in Pain? Yes    Pain Score 5     Pain Location Elbow    Pain Orientation Right;Left    Pain Descriptors / Indicators Aching    Pain Type Chronic pain    Pain Onset More than a month ago    Pain Frequency Constant    Multiple Pain Sites No             Capillary Blood Glucose: No results found for this or any previous visit (from the past 24 hour(s)).    Social History   Tobacco Use  Smoking Status Never   Passive exposure: Never  Smokeless Tobacco Never    Goals Met:  Independence with exercise equipment Exercise tolerated well No report of concerns or symptoms today Strength training completed today  Goals Unmet:  Not Applicable  Comments: check out at 10:30   Dr. Dina Rich is Medical Director for [facility] Cardiac Rehab

## 2023-02-02 NOTE — Progress Notes (Signed)
Cardiac Individual Treatment Plan  Patient Details  Name: Christian Davis MRN: [id number] Date of Birth: 13-Apr-1956 Referring Provider:   Flowsheet Row CARDIAC REHAB PHASE II ORIENTATION from 01/20/2023 in [facility] CARDIAC REHABILITATION  Referring Provider Dr. Dorris Fetch       Initial Encounter Date:  Flowsheet Row CARDIAC REHAB PHASE II ORIENTATION from 01/20/2023 in [place] CARDIAC REHABILITATION  Date 01/20/23       Visit Diagnosis: NSTEMI (non-ST elevated myocardial infarction) (HCC)  S/P CABG x 3  Patient's Home Medications on Admission:  Current Outpatient Medications:    acetaminophen (TYLENOL) 500 MG tablet, Take 2 tablets (1,000 mg total) by mouth every 6 (six) hours as needed., Disp: 30 tablet, Rfl: 0   aspirin EC 81 MG tablet, Take 1 tablet (81 mg total) by mouth daily. Swallow whole., Disp: 30 tablet, Rfl: 12   azelastine (ASTELIN) 0.1 % nasal spray, Place 2 sprays into both nostrils at bedtime. Use in each nostril as directed, Disp: , Rfl:    cholecalciferol (VITAMIN D3) 25 MCG (1000 UNIT) tablet, Take 1,000 Units by mouth daily., Disp: , Rfl:    citalopram (CELEXA) 20 MG tablet, Take 20 mg by mouth daily., Disp: , Rfl:    clopidogrel (PLAVIX) 75 MG tablet, Take 1 tablet (75 mg total) by mouth daily., Disp: 60 tablet, Rfl: 1   cyanocobalamin (VITAMIN B12) 1000 MCG tablet, Take 1,000 mcg by mouth daily., Disp: , Rfl:    donepezil (ARICEPT ODT) 10 MG disintegrating tablet, Take 10 mg by mouth at bedtime., Disp: , Rfl:    furosemide (LASIX) 40 MG tablet, Take 1 tablet (40 mg total) by mouth daily., Disp: 14 tablet, Rfl: 0   levocetirizine (XYZAL) 5 MG tablet, Take 5 mg by mouth daily., Disp: , Rfl:    losartan (COZAAR) 25 MG tablet, Take 1 tablet (25 mg total) by mouth daily., Disp: 30 tablet, Rfl: 3   meloxicam (MOBIC) 7.5 MG tablet, Take 1 tablet (7.5 mg total) by mouth daily as needed for pain., Disp: , Rfl:    Menthol, Topical Analgesic, (BLUE-EMU MAXIMUM STRENGTH  EX), Apply 1 Application topically daily as needed (pain)., Disp: , Rfl:    Metoprolol Tartrate 37.5 MG TABS, Take 1 tablet (37.5 mg total) by mouth 2 (two) times daily., Disp: 90 tablet, Rfl: 1   potassium chloride SA (KLOR-CON M) 20 MEQ tablet, Take 1 tablet (20 mEq total) by mouth daily., Disp: 14 tablet, Rfl: 0   predniSONE (STERAPRED UNI-PAK 21 TAB) 10 MG (21) TBPK tablet, Take 6 tablets (60 mg total) by mouth daily for 1 day, THEN 5 tablets (50 mg total) daily for 1 day, THEN 4 tablets (40 mg total) daily for 1 day, THEN 3 tablets (30 mg total) daily for 1 day, THEN 2 tablets (20 mg total) daily for 1 day, THEN 1 tablet (10 mg total) daily for 1 day., Disp: 15 tablet, Rfl: 0   rosuvastatin (CRESTOR) 40 MG tablet, Take 1 tablet (40 mg total) by mouth daily., Disp: 30 tablet, Rfl: 3  Past Medical History: Past Medical History:  Diagnosis Date   Arthritis    Cancer (HCC)    Coronary artery calcification seen on CT scan    Depression    GERD (gastroesophageal reflux disease)    H/O asbestos exposure    History of kidney stones     Tobacco Use: Social History   Tobacco Use  Smoking Status Never   Passive exposure: Never  Smokeless Tobacco Never  Labs: Review Flowsheet  More data exists      Latest Ref Rng & Units 10/26/2017 01/28/2020 11/15/2022 11/16/2022 11/18/2022  Labs for ITP Cardiac and Pulmonary Rehab  Cholestrol 0 - 200 mg/dL 914  782  - 956  -  LDL (calc) 0 - 99 mg/dL 213  086  - 578  -  HDL-C >40 mg/dL 50  53  - 62  -  Trlycerides <150 mg/dL 74  469  - 71  -  Hemoglobin A1c 4.8 - 5.6 % - - 5.9  5.9  -  PH, Arterial 7.35 - 7.45 - - - - 7.299  7.286  7.391  7.399  7.371  7.449  7.445  7.396  7.330   PCO2 arterial 32 - 48 mmHg - - - - 41.3  39.8  31.9  34.7  39.4  31.0  31.3  34.8  47.9   Bicarbonate 20.0 - 28.0 mmol/L - - - - 20.0  18.8  19.8  21.4  22.8  21.5  21.5  23.1  21.4  25.3   TCO2 22 - 32 mmol/L - - - - 21  20  21  23  22  24  25  22  24  22  24  22  24  27  27     Acid-base deficit 0.0 - 2.0 mmol/L - - - - 6.0  7.0  5.0  3.0  2.0  2.0  2.0  2.0  3.0  1.0   O2 Saturation % - - - - 97  99  96  100  100  100  100  79  100  100     Capillary Blood Glucose: Lab Results  Component Value Date   GLUCAP 90 11/20/2022   GLUCAP 97 11/20/2022   GLUCAP 94 11/20/2022   GLUCAP 117 (H) 11/19/2022   GLUCAP 123 (H) 11/19/2022     Exercise Target Goals: Exercise Program Goal: Individual exercise prescription set using results from initial 6 min walk test and THRR while considering  patient's activity barriers and safety.   Exercise Prescription Goal: Starting with aerobic activity 30 plus minutes a day, 3 days per week for initial exercise prescription. Provide home exercise prescription and guidelines that participant acknowledges understanding prior to discharge.  Activity Barriers & Risk Stratification:  Activity Barriers & Cardiac Risk Stratification - 01/20/23 0900       Activity Barriers & Cardiac Risk Stratification   Activity Barriers Neck/Spine Problems    Cardiac Risk Stratification High             6 Minute Walk:  6 Minute Walk     Row Name 01/20/23 1011         6 Minute Walk   Phase Initial     Distance 1000 feet     Walk Time 6 minutes     # of Rest Breaks 0     MPH 1.89     METS 1.8     RPE 11     VO2 Peak 6.3     Symptoms No     Resting HR 56 bpm     Resting BP 112/72     Resting Oxygen Saturation  96 %     Exercise Oxygen Saturation  during 6 min walk 92 %     Max Ex. HR 68 bpm     Max Ex. BP 120/70     2 Minute Post BP 116/70  Oxygen Initial Assessment:   Oxygen Re-Evaluation:   Oxygen Discharge (Final Oxygen Re-Evaluation):   Initial Exercise Prescription:  Initial Exercise Prescription - 01/20/23 1000       Date of Initial Exercise RX and Referring Provider   Date 01/20/23    Referring Provider Dr. Dorris Fetch    Expected Discharge Date 04/13/23      Treadmill   MPH 1    Grade  0    Minutes 17      NuStep   Level 1    SPM 50    Minutes 22      Prescription Details   Frequency (times per week) 3    Duration Progress to 30 minutes of continuous aerobic without signs/symptoms of physical distress      Intensity   THRR 40-80% of Max Heartrate 62-124    Ratings of Perceived Exertion 11-13      Resistance Training   Training Prescription Yes    Weight 3    Reps 10-15             Perform Capillary Blood Glucose checks as needed.  Exercise Prescription Changes:   Exercise Prescription Changes     Row Name 01/31/23 1200             Response to Exercise   Blood Pressure (Admit) 130/60       Blood Pressure (Exercise) 124/62       Blood Pressure (Exit) 116/62       Heart Rate (Admit) 59 bpm       Heart Rate (Exercise) 84 bpm       Heart Rate (Exit) 63 bpm       Rating of Perceived Exertion (Exercise) 12       Duration Continue with 30 min of aerobic exercise without signs/symptoms of physical distress.       Intensity THRR unchanged         Progression   Progression Continue to progress workloads to maintain intensity without signs/symptoms of physical distress.         Resistance Training   Training Prescription Yes       Weight 4       Reps 10-15       Time 10 Minutes         Treadmill   MPH 1.5       Grade 0       Minutes 17       METs 2.15         NuStep   Level 2       SPM 63       Minutes 22       METs 1.79                Exercise Comments:   Exercise Goals and Review:   Exercise Goals     Row Name 01/20/23 1015 01/31/23 1215           Exercise Goals   Increase Physical Activity Yes Yes      Intervention Provide advice, education, support and counseling about physical activity/exercise needs.;Develop an individualized exercise prescription for aerobic and resistive training based on initial evaluation findings, risk stratification, comorbidities and participant's personal goals. Provide advice, education,  support and counseling about physical activity/exercise needs.;Develop an individualized exercise prescription for aerobic and resistive training based on initial evaluation findings, risk stratification, comorbidities and participant's personal goals.      Expected Outcomes Short Term: Attend rehab on a regular  basis to increase amount of physical activity.;Long Term: Add in home exercise to make exercise part of routine and to increase amount of physical activity.;Long Term: Exercising regularly at least 3-5 days a week. Short Term: Attend rehab on a regular basis to increase amount of physical activity.;Long Term: Add in home exercise to make exercise part of routine and to increase amount of physical activity.;Long Term: Exercising regularly at least 3-5 days a week.      Increase Strength and Stamina Yes Yes      Intervention Provide advice, education, support and counseling about physical activity/exercise needs.;Develop an individualized exercise prescription for aerobic and resistive training based on initial evaluation findings, risk stratification, comorbidities and participant's personal goals. Provide advice, education, support and counseling about physical activity/exercise needs.;Develop an individualized exercise prescription for aerobic and resistive training based on initial evaluation findings, risk stratification, comorbidities and participant's personal goals.      Expected Outcomes Short Term: Increase workloads from initial exercise prescription for resistance, speed, and METs.;Short Term: Perform resistance training exercises routinely during rehab and add in resistance training at home;Long Term: Improve cardiorespiratory fitness, muscular endurance and strength as measured by increased METs and functional capacity ( ) Short Term: Increase workloads from initial exercise prescription for resistance, speed, and METs.;Short Term: Perform resistance training exercises routinely during  rehab and add in resistance training at home;Long Term: Improve cardiorespiratory fitness, muscular endurance and strength as measured by increased METs and functional capacity ( )      Able to understand and use rate of perceived exertion (RPE) scale Yes Yes      Intervention Provide education and explanation on how to use RPE scale Provide education and explanation on how to use RPE scale      Expected Outcomes Short Term: Able to use RPE daily in rehab to express subjective intensity level;Long Term:  Able to use RPE to guide intensity level when exercising independently Short Term: Able to use RPE daily in rehab to express subjective intensity level;Long Term:  Able to use RPE to guide intensity level when exercising independently      Knowledge and understanding of Target Heart Rate Range (THRR) Yes Yes      Intervention Provide education and explanation of THRR including how the numbers were predicted and where they are located for reference Provide education and explanation of THRR including how the numbers were predicted and where they are located for reference      Expected Outcomes Short Term: Able to state/look up THRR;Long Term: Able to use THRR to govern intensity when exercising independently;Short Term: Able to use daily as guideline for intensity in rehab Short Term: Able to state/look up THRR;Long Term: Able to use THRR to govern intensity when exercising independently;Short Term: Able to use daily as guideline for intensity in rehab      Able to check pulse independently Yes Yes      Intervention Provide education and demonstration on how to check pulse in carotid and radial arteries.;Review the importance of being able to check your own pulse for safety during independent exercise Provide education and demonstration on how to check pulse in carotid and radial arteries.;Review the importance of being able to check your own pulse for safety during independent exercise      Expected  Outcomes Short Term: Able to explain why pulse checking is important during independent exercise;Long Term: Able to check pulse independently and accurately Short Term: Able to explain why pulse checking is important during independent exercise;Long  Term: Able to check pulse independently and accurately      Understanding of Exercise Prescription Yes Yes      Intervention Provide education, explanation, and written materials on patient's individual exercise prescription Provide education, explanation, and written materials on patient's individual exercise prescription      Expected Outcomes Short Term: Able to explain program exercise prescription;Long Term: Able to explain home exercise prescription to exercise independently Short Term: Able to explain program exercise prescription;Long Term: Able to explain home exercise prescription to exercise independently               Exercise Goals Re-Evaluation :  Exercise Goals Re-Evaluation     Row Name 01/31/23 1216             Exercise Goal Re-Evaluation   Exercise Goals Review Increase Physical Activity;Increase Strength and Stamina;Able to understand and use rate of perceived exertion (RPE) scale;Knowledge and understanding of Target Heart Rate Range (THRR);Able to check pulse independently;Understanding of Exercise Prescription       Comments Pt has completed 5 sessions of cardiac rehab. He seems to be enjoying class and is progressing well. He is forgetfull at times and has to be reminded about equipment he is on. He is currently exericisng at 2.15 METs on the treadmill, Will continue to monitor and progress as able.       Expected Outcomes Through exercise at rehab and home, patient will meet their expected goals.,                 Discharge Exercise Prescription (Final Exercise Prescription Changes):  Exercise Prescription Changes - 01/31/23 1200       Response to Exercise   Blood Pressure (Admit) 130/60    Blood Pressure  (Exercise) 124/62    Blood Pressure (Exit) 116/62    Heart Rate (Admit) 59 bpm    Heart Rate (Exercise) 84 bpm    Heart Rate (Exit) 63 bpm    Rating of Perceived Exertion (Exercise) 12    Duration Continue with 30 min of aerobic exercise without signs/symptoms of physical distress.    Intensity THRR unchanged      Progression   Progression Continue to progress workloads to maintain intensity without signs/symptoms of physical distress.      Resistance Training   Training Prescription Yes    Weight 4    Reps 10-15    Time 10 Minutes      Treadmill   MPH 1.5    Grade 0    Minutes 17    METs 2.15      NuStep   Level 2    SPM 63    Minutes 22    METs 1.79             Nutrition:  Target Goals: Understanding of nutrition guidelines, daily intake of sodium 1500mg , cholesterol 200mg , calories 30% from fat and 7% or less from saturated fats, daily to have 5 or more servings of fruits and vegetables.  Biometrics:  Pre Biometrics - 01/20/23 1015       Pre Biometrics   Height 5\' 11"  (1.803 m)    Weight 111.5 kg    Waist Circumference 46 inches    Hip Circumference 44 inches    Waist to Hip Ratio 1.05 %    BMI (Calculated) 34.3    Triceps Skinfold 24 mm    % Body Fat 34.3 %    Grip Strength 35.6 kg    Flexibility 0 in  Single Leg Stand 3.14 seconds              Nutrition Therapy Plan and Nutrition Goals:  Nutrition Therapy & Goals - 01/24/23 1325       Nutrition Therapy   RD appointment deferred Yes      Personal Nutrition Goals   Comments We provide educational sessions on heart healthy nutrition and assistance with RD referral if patient is interested.      Intervention Plan   Intervention Nutrition handout(s) given to patient.    Expected Outcomes Short Term Goal: Understand basic principles of dietary content, such as calories, fat, sodium, cholesterol and nutrients.             Nutrition Assessments:  Nutrition Assessments - 01/20/23  0953       MEDFICTS Scores   Pre Score 13            MEDIFICTS Score Key: ?70 Need to make dietary changes  40-70 Heart Healthy Diet ? 40 Therapeutic Level Cholesterol Diet   Picture Your Plate Scores: <16 Unhealthy dietary pattern with much room for improvement. 41-50 Dietary pattern unlikely to meet recommendations for good health and room for improvement. 51-60 More healthful dietary pattern, with some room for improvement.  >60 Healthy dietary pattern, although there may be some specific behaviors that could be improved.    Nutrition Goals Re-Evaluation:   Nutrition Goals Discharge (Final Nutrition Goals Re-Evaluation):   Psychosocial: Target Goals: Acknowledge presence or absence of significant depression and/or stress, maximize coping skills, provide positive support system. Participant is able to verbalize types and ability to use techniques and skills needed for reducing stress and depression.  Initial Review & Psychosocial Screening:  Initial Psych Review & Screening - 01/20/23 1004       Initial Review   Current issues with None Identified      Family Dynamics   Good Support System? Yes      Barriers   Psychosocial barriers to participate in program There are no identifiable barriers or psychosocial needs.;The patient should benefit from training in stress management and relaxation.      Screening Interventions   Interventions Provide feedback about the scores to participant    Expected Outcomes Short Term goal: Utilizing psychosocial counselor, staff and physician to assist with identification of specific Stressors or current issues interfering with healing process. Setting desired goal for each stressor or current issue identified.;Short Term goal: Identification and review with participant of any Quality of Life or Depression concerns found by scoring the questionnaire.             Quality of Life Scores:  Quality of Life - 01/20/23 1016        Quality of Life   Select Quality of Life      Quality of Life Scores   Health/Function Pre 22.18 %    Socioeconomic Pre 24 %    Psych/Spiritual Pre 28.29 %    Family Pre 26.4 %    GLOBAL Pre 24.55 %            Scores of 19 and below usually indicate a poorer quality of life in these areas.  A difference of  2-3 points is a clinically meaningful difference.  A difference of 2-3 points in the total score of the Quality of Life Index has been associated with significant improvement in overall quality of life, self-image, physical symptoms, and general health in studies assessing change in quality of life.  PHQ-9: Review  Flowsheet       01/20/2023 02/08/2020 10/24/2017 11/22/2016  Depression screen PHQ 2/9  Decreased Interest 0 0 2 0  Down, Depressed, Hopeless 0 0 0 0  PHQ - 2 Score 0 0 2 0  Altered sleeping 0 - 0 -  Tired, decreased energy 0 - 2 -  Change in appetite 0 - 0 -  Feeling bad or failure about yourself  0 - 0 -  Trouble concentrating 0 - 1 -  Moving slowly or fidgety/restless 0 - 0 -  Suicidal thoughts 0 - 0 -  PHQ-9 Score 0 - 5 -  Difficult doing work/chores Not difficult at all - Somewhat difficult -   Interpretation of Total Score  Total Score Depression Severity:  1-4 = Minimal depression, 5-9 = Mild depression, 10-14 = Moderate depression, 15-19 = Moderately severe depression, 20-27 = Severe depression   Psychosocial Evaluation and Intervention:  Psychosocial Evaluation - 01/20/23 1015       Psychosocial Evaluation & Interventions   Interventions Relaxation education;Stress management education;Encouraged to exercise with the program and follow exercise prescription    Comments Patient has no psychosocial barriers or issues identifiied at his orientation visit. His PHQ-9 score was 0. His is accompained by his wife today. He denies any depression or anxiety. He does have short term memory loss and has recently started Donepezil 10 mg daily. He has a good support  system with his wife and several friends. They have a son that lives in [place] and one grandson. He is retired from [employer] 10 years ago and he says his job was very physical but when he retired he has not been very active. 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 He is ready to start the program hoping to get stronger and live a healthier life.    Expected Outcomes Patient will continue ot have no psychosocial barriers identified.    Continue Psychosocial Services  No Follow up required             Psychosocial Re-Evaluation:  Psychosocial Re-Evaluation     Row Name 01/24/23 1321             Psychosocial Re-Evaluation   Current issues with None Identified       Comments Patient is new to the program. He has no psychosocial barriers identified. He has completed 1 session. We will continue to monitor his progress.       Expected Outcomes Patient will continue to have no psychosocial barriers identified.       Interventions Stress management education;Encouraged to attend Cardiac Rehabilitation for the exercise;Relaxation education       Continue Psychosocial Services  No Follow up required                Psychosocial Discharge (Final Psychosocial Re-Evaluation):  Psychosocial Re-Evaluation - 01/24/23 1321       Psychosocial Re-Evaluation   Current issues with None Identified    Comments Patient is new to the program. He has no psychosocial barriers identified. He has completed 1 session. We will continue to monitor his progress.    Expected Outcomes Patient will continue to have no psychosocial barriers identified.    Interventions Stress management education;Encouraged to attend Cardiac Rehabilitation for the exercise;Relaxation education    Continue Psychosocial Services  No Follow up required             Vocational Rehabilitation: Provide vocational rehab assistance to  qualifying candidates.   Vocational Rehab Evaluation  & Intervention:  Vocational Rehab - 01/20/23 0957       Initial Vocational Rehab Evaluation & Intervention   Assessment shows need for Vocational Rehabilitation No      Vocational Rehab Re-Evaulation   Comments Patient is retired and does not need vocational rehab.             Education: Education Goals: Education classes will be provided on a weekly basis, covering required topics. Participant will state understanding/return demonstration of topics presented.  Learning Barriers/Preferences:  Learning Barriers/Preferences - 01/20/23 1023       Learning Barriers/Preferences   Learning Barriers None    Learning Preferences Audio             Education Topics: Hypertension, Hypertension Reduction -Define heart disease and high blood pressure. Discus how high blood pressure affects the body and ways to reduce high blood pressure.   Exercise and Your Heart -Discuss why it is important to exercise, the FITT principles of exercise, normal and abnormal responses to exercise, and how to exercise safely.   Angina -Discuss definition of angina, causes of angina, treatment of angina, and how to decrease risk of having angina.   Cardiac Medications -Review what the following cardiac medications are used for, how they affect the body, and side effects that may occur when taking the medications.  Medications include Aspirin, Beta blockers, calcium channel blockers, ACE Inhibitors, angiotensin receptor blockers, diuretics, digoxin, and antihyperlipidemics.   Congestive Heart Failure -Discuss the definition of CHF, how to live with CHF, the signs and symptoms of CHF, and how keep track of weight and sodium intake.   Heart Disease and Intimacy -Discus the effect sexual activity has on the heart, how changes occur during intimacy as we age, and safety during sexual activity.   Smoking Cessation / COPD -Discuss different methods to quit smoking, the health benefits of quitting  smoking, and the definition of COPD.   Nutrition I: Fats -Discuss the types of cholesterol, what cholesterol does to the heart, and how cholesterol levels can be controlled.   Nutrition II: Labels -Discuss the different components of food labels and how to read food label   Heart Parts/Heart Disease and PAD -Discuss the anatomy of the heart, the pathway of blood circulation through the heart, and these are affected by heart disease.   Stress I: Signs and Symptoms -Discuss the causes of stress, how stress may lead to anxiety and depression, and ways to limit stress. Flowsheet Row CARDIAC REHAB PHASE II EXERCISE from 01/26/2023 in [place] CARDIAC REHABILITATION  Date 01/26/23  Educator HB  Instruction Review Code 1- Verbalizes Understanding       Stress II: Relaxation -Discuss different types of relaxation techniques to limit stress.   Warning Signs of Stroke / TIA -Discuss definition of a stroke, what the signs and symptoms are of a stroke, and how to identify when someone is having stroke.   Knowledge Questionnaire Score:  Knowledge Questionnaire Score - 01/20/23 0956       Knowledge Questionnaire Score   Pre Score 19/24             Core Components/Risk Factors/Patient Goals at Admission:  Personal Goals and Risk Factors at Admission - 01/20/23 0957       Core Components/Risk Factors/Patient Goals on Admission    Weight Management Weight Maintenance    Hypertension Yes    Intervention Provide education on lifestyle modifcations including regular physical activity/exercise, weight  management, moderate sodium restriction and increased consumption of fresh fruit, vegetables, and low fat dairy, alcohol moderation, and smoking cessation.;Monitor prescription use compliance.    Expected Outcomes Short Term: Continued assessment and intervention until BP is < 140/96mm HG in hypertensive participants. < 130/48mm HG in hypertensive participants with diabetes, heart  failure or chronic kidney disease.;Long Term: Maintenance of blood pressure at goal levels.    Lipids Yes    Intervention Provide education and support for participant on nutrition & aerobic/resistive exercise along with prescribed medications to achieve LDL 70mg , HDL >40mg .    Expected Outcomes Short Term: Participant states understanding of desired cholesterol values and is compliant with medications prescribed. Participant is following exercise prescription and nutrition guidelines.;Long Term: Cholesterol controlled with medications as prescribed, with individualized exercise RX and with personalized nutrition plan. Value goals: LDL < 70mg , HDL > 40 mg.    Personal Goal Other Yes    Personal Goal Patient wants to get better and stronger and be able to live a healthier life.    Intervention Patient will attend CR with exercise and education.    Expected Outcomes Patient will complete the program meeting both personal and program goals.             Core Components/Risk Factors/Patient Goals Review:   Goals and Risk Factor Review     Row Name 01/24/23 1322             Core Components/Risk Factors/Patient Goals Review   Personal Goals Review Weight Management/Obesity;Lipids;Hypertension;Other       Review Patient was referred to CR with CABGx3 and NSTEMI. He has multiple risk factors for CAD and is participating in the program for risk modification. He started the program today. His blood pressure is at goal. His personal goals for the program are to get better and stronger and be able to live a healthier life. We will continue to monitor his progress as he works towards meeting these goals.       Expected Outcomes Patient will complete the program meeting both personal and program goals.                Core Components/Risk Factors/Patient Goals at Discharge (Final Review):   Goals and Risk Factor Review - 01/24/23 1322       Core Components/Risk Factors/Patient Goals Review    Personal Goals Review Weight Management/Obesity;Lipids;Hypertension;Other    Review Patient was referred to CR with CABGx3 and NSTEMI. He has multiple risk factors for CAD and is participating in the program for risk modification. He started the program today. His blood pressure is at goal. His personal goals for the program are to get better and stronger and be able to live a healthier life. We will continue to monitor his progress as he works towards meeting these goals.    Expected Outcomes Patient will complete the program meeting both personal and program goals.             ITP Comments:   Comments: ITP REVIEW Pt is making expected progress toward Cardiac Rehab goals after completing 5 sessions. Recommend continued exercise, life style modification, education, and increased stamina and strength.

## 2023-02-04 ENCOUNTER — Encounter (HOSPITAL_COMMUNITY): Payer: Medicare Other

## 2023-02-07 ENCOUNTER — Encounter (HOSPITAL_COMMUNITY)
Admission: RE | Admit: 2023-02-07 | Discharge: 2023-02-07 | Disposition: A | Discharge status: Home / Self Care | Payer: Medicare Other | Source: Ambulatory Visit | Attending: Internal Medicine | Admitting: Internal Medicine

## 2023-02-07 DIAGNOSIS — Z951 Presence of aortocoronary bypass graft: Secondary | ICD-10-CM | POA: Diagnosis not present

## 2023-02-07 DIAGNOSIS — I214 Non-ST elevation (NSTEMI) myocardial infarction: Secondary | ICD-10-CM

## 2023-02-07 NOTE — Progress Notes (Signed)
Daily Session Note  Patient Details  Name: Christian Davis MRN: [id number] Date of Birth: [birth year] Referring Provider:   Flowsheet Row CARDIAC REHAB PHASE II ORIENTATION from 01/20/2023 in [facility] CARDIAC REHABILITATION  Referring Provider Dr. Dorris Fetch       Encounter Date: 02/07/2023  Check In:  Session Check In - 02/07/23 0930       Check-In   Supervising physician immediately available to respond to emergencies [facility] MD immediately available    Physician(s) Dr Jenene Slicker    Location [facility]    Staff Present [name], BS, Exercise Physiologist;Shizuo Biskup Daphine Deutscher, RN, BSN    Virtual Visit No    Medication changes reported     No    Fall or balance concerns reported    Yes    Comments Patient says he loses his balance at times.    Tobacco Cessation No Change    Warm-up and Cool-down Performed as group-led instruction    Resistance Training Performed Yes    VAD Patient? No      Pain Assessment   Currently in Pain? No/denies             Capillary Blood Glucose: No results found for this or any previous visit (from the past 24 hour(s)).    Social History   Tobacco Use  Smoking Status Never   Passive exposure: Never  Smokeless Tobacco Never    Goals Met:  Independence with exercise equipment Exercise tolerated well No report of concerns or symptoms today Strength training completed today  Goals Unmet:  Not Applicable  Comments: Checkout at 1030.   Dr. Dina Rich is Medical Director for [facility] Cardiac Rehab

## 2023-02-08 ENCOUNTER — Emergency Department (HOSPITAL_COMMUNITY): Payer: Medicare Other

## 2023-02-08 ENCOUNTER — Observation Stay (HOSPITAL_COMMUNITY)
Admission: EM | Admit: 2023-02-08 | Discharge: 2023-02-09 | Disposition: A | Discharge status: Home / Self Care | Payer: Medicare Other | Attending: Family Medicine | Admitting: Family Medicine

## 2023-02-08 ENCOUNTER — Encounter (HOSPITAL_COMMUNITY): Payer: Self-pay | Admitting: Emergency Medicine

## 2023-02-08 ENCOUNTER — Other Ambulatory Visit: Payer: Self-pay

## 2023-02-08 DIAGNOSIS — G250 Essential tremor: Secondary | ICD-10-CM | POA: Diagnosis present

## 2023-02-08 DIAGNOSIS — I2510 Atherosclerotic heart disease of native coronary artery without angina pectoris: Secondary | ICD-10-CM | POA: Diagnosis not present

## 2023-02-08 DIAGNOSIS — Z7982 Long term (current) use of aspirin: Secondary | ICD-10-CM | POA: Diagnosis not present

## 2023-02-08 DIAGNOSIS — R079 Chest pain, unspecified: Secondary | ICD-10-CM | POA: Diagnosis not present

## 2023-02-08 DIAGNOSIS — R0789 Other chest pain: Secondary | ICD-10-CM | POA: Diagnosis not present

## 2023-02-08 DIAGNOSIS — Z79899 Other long term (current) drug therapy: Secondary | ICD-10-CM | POA: Diagnosis not present

## 2023-02-08 DIAGNOSIS — F411 Generalized anxiety disorder: Secondary | ICD-10-CM | POA: Diagnosis present

## 2023-02-08 DIAGNOSIS — Z951 Presence of aortocoronary bypass graft: Secondary | ICD-10-CM

## 2023-02-08 DIAGNOSIS — I10 Essential (primary) hypertension: Secondary | ICD-10-CM | POA: Insufficient documentation

## 2023-02-08 LAB — CBC
HCT: 39.2 % (ref 39.0–52.0)
Hemoglobin: 12.5 g/dL — ABNORMAL LOW (ref 13.0–17.0)
MCH: 29 pg (ref 26.0–34.0)
MCHC: 31.9 g/dL (ref 30.0–36.0)
MCV: 91 fL (ref 80.0–100.0)
Platelets: 154 10*3/uL (ref 150–400)
RBC: 4.31 MIL/uL (ref 4.22–5.81)
RDW: 14.6 % (ref 11.5–15.5)
WBC: 6.3 10*3/uL (ref 4.0–10.5)
nRBC: 0 % (ref 0.0–0.2)

## 2023-02-08 LAB — BASIC METABOLIC PANEL
Anion gap: 7 (ref 5–15)
BUN: 13 mg/dL (ref 8–23)
CO2: 26 mmol/L (ref 22–32)
Calcium: 8.7 mg/dL — ABNORMAL LOW (ref 8.9–10.3)
Chloride: 103 mmol/L (ref 98–111)
Creatinine, Ser: 0.8 mg/dL (ref 0.61–1.24)
GFR, Estimated: >60 mL/min (ref >60)
Glucose, Bld: 95 mg/dL (ref 70–99)
Potassium: 4.2 mmol/L (ref 3.5–5.1)
Sodium: 136 mmol/L (ref 135–145)

## 2023-02-08 LAB — PROTIME-INR
INR: 1.1 (ref 0.8–1.2)
Prothrombin Time: 14.8 s (ref 11.4–15.2)

## 2023-02-08 LAB — TROPONIN I (HIGH SENSITIVITY)
Troponin I (High Sensitivity): 9 ng/L (ref <18)
Troponin I (High Sensitivity): 10 ng/L

## 2023-02-08 MED ORDER — ASPIRIN 81 MG PO TBEC
81.0000 mg | DELAYED_RELEASE_TABLET | Freq: Every day | ORAL | Status: DC
  Administered 2023-02-09: 81 mg via ORAL
  Filled 2023-02-08: qty 1

## 2023-02-08 MED ORDER — NITROGLYCERIN 2 % TD OINT
0.5000 [in_us] | TOPICAL_OINTMENT | Freq: Once | TRANSDERMAL | Status: AC
  Administered 2023-02-08: 0.5 [in_us] via TOPICAL
  Filled 2023-02-08: qty 1

## 2023-02-08 MED ORDER — CLOPIDOGREL BISULFATE 75 MG PO TABS
75.0000 mg | ORAL_TABLET | Freq: Every day | ORAL | Status: DC
  Administered 2023-02-09: 75 mg via ORAL
  Filled 2023-02-08: qty 1

## 2023-02-08 MED ORDER — ENOXAPARIN SODIUM 40 MG/0.4ML IJ SOSY
40.0000 mg | PREFILLED_SYRINGE | INTRAMUSCULAR | Status: DC
  Administered 2023-02-08: 40 mg via SUBCUTANEOUS
  Filled 2023-02-08: qty 0.4

## 2023-02-08 MED ORDER — ACETAMINOPHEN 650 MG RE SUPP: 650.0000 mg | Freq: Four times a day (QID) | RECTAL | Status: DC | PRN

## 2023-02-08 MED ORDER — ONDANSETRON HCL 4 MG PO TABS: 4.0000 mg | ORAL_TABLET | Freq: Four times a day (QID) | ORAL | Status: DC | PRN

## 2023-02-08 MED ORDER — ONDANSETRON HCL 4 MG/2ML IJ SOLN: 4.0000 mg | Freq: Four times a day (QID) | INTRAMUSCULAR | Status: DC | PRN

## 2023-02-08 MED ORDER — DONEPEZIL HCL 10 MG PO TBDP: 10.0000 mg | ORAL_TABLET | Freq: Every day | ORAL | Status: DC

## 2023-02-08 MED ORDER — POLYETHYLENE GLYCOL 3350 17 G PO PACK: 17.0000 g | PACK | Freq: Every day | ORAL | Status: DC | PRN

## 2023-02-08 MED ORDER — ROSUVASTATIN CALCIUM 20 MG PO TABS
40.0000 mg | ORAL_TABLET | Freq: Every day | ORAL | Status: DC
  Administered 2023-02-09: 40 mg via ORAL
  Filled 2023-02-08: qty 2

## 2023-02-08 MED ORDER — ACETAMINOPHEN 325 MG PO TABS: 650.0000 mg | ORAL_TABLET | Freq: Four times a day (QID) | ORAL | Status: DC | PRN

## 2023-02-08 MED ORDER — LOSARTAN POTASSIUM 50 MG PO TABS
25.0000 mg | ORAL_TABLET | Freq: Every day | ORAL | Status: DC
  Administered 2023-02-09: 25 mg via ORAL
  Filled 2023-02-08: qty 1

## 2023-02-08 MED ORDER — DONEPEZIL HCL 5 MG PO TABS
10.0000 mg | ORAL_TABLET | Freq: Every day | ORAL | Status: DC
  Administered 2023-02-08: 10 mg via ORAL
  Filled 2023-02-08: qty 2

## 2023-02-08 MED ORDER — METOPROLOL TARTRATE 25 MG PO TABS
37.5000 mg | ORAL_TABLET | Freq: Two times a day (BID) | ORAL | Status: DC
  Administered 2023-02-08 – 2023-02-09 (×2): 37.5 mg via ORAL
  Filled 2023-02-08 (×2): qty 2

## 2023-02-08 NOTE — ED Provider Notes (Signed)
[place] EMERGENCY DEPARTMENT AT [facility] Provider Note   CSN: [id number] Arrival date & time: 02/08/23  1403     History  Chief Complaint  Patient presents with   Chest Pain    Christian Davis is a 67 y.o. male.   Chest Pain  This patient is a 67 year old male, he has a known history of cognitive decline currently on Aricept, accompanied by his wife who is the primary historian.  She reports that this patient had recently undergone a triple bypass coronary graft in April of this year approximately 2 months ago, he has done well, vein harvesting came from his right lower extremity.  Today he was on the porch outside and when he walked in feeling dizzy complaining of pain in his chest radiating into his jaw and his arm.  He still feels those symptoms at this time though it is gradually improving.  He is not short of breath coughing or feeling sweaty.  He does have some swelling in the right lower extremity which has been present since the surgery    Home Medications Prior to Admission medications   Medication Sig Start Date End Date Taking? Authorizing Provider  nitroGLYCERIN (NITROSTAT) 0.4 MG SL tablet Place 1 tablet (0.4 mg total) under the tongue every 5 (five) minutes as needed for chest pain. 02/09/23 02/09/24 Yes Emokpae, Courage, MD  acetaminophen (TYLENOL) 500 MG tablet Take 2 tablets (1,000 mg total) by mouth every 6 (six) hours as needed. 11/22/22   Ardelle Balls, PA-C  aspirin EC 81 MG tablet Take 1 tablet (81 mg total) by mouth daily with breakfast. Swallow whole. 02/09/23   Shon Hale, MD  azelastine (ASTELIN) 0.1 % nasal spray Place 2 sprays into both nostrils at bedtime. Use in each nostril as directed    [provider]  cholecalciferol (VITAMIN D3) 25 MCG (1000 UNIT) tablet Take 1,000 Units by mouth daily.    [provider]  citalopram (CELEXA) 20 MG tablet Take 20 mg by mouth daily. 08/03/22   [provider]   clopidogrel (PLAVIX) 75 MG tablet Take 1 tablet (75 mg total) by mouth daily. 01/18/23   Stehler, Oren Bracket, PA-C  cyanocobalamin (VITAMIN B12) 1000 MCG tablet Take 1,000 mcg by mouth daily.    [provider]  donepezil (ARICEPT ODT) 10 MG disintegrating tablet Take 1 tablet (10 mg total) by mouth at bedtime. 02/09/23   Shon Hale, MD  furosemide (LASIX) 40 MG tablet Take 1 tablet (40 mg total) by mouth daily. 01/18/23   Stehler, Oren Bracket, PA-C  levocetirizine (XYZAL) 5 MG tablet Take 5 mg by mouth daily.    [provider]  losartan (COZAAR) 25 MG tablet Take 1 tablet (25 mg total) by mouth daily. 11/22/22   Ardelle Balls, PA-C  Menthol, Topical Analgesic, (BLUE-EMU MAXIMUM STRENGTH EX) Apply 1 Application topically daily as needed (pain).    [provider]  Metoprolol Tartrate 37.5 MG TABS Take 1 tablet (37.5 mg total) by mouth 2 (two) times daily. 01/18/23   Stehler, Oren Bracket, PA-C  potassium chloride SA (KLOR-CON M) 20 MEQ tablet Take 1 tablet (20 mEq total) by mouth daily. 01/18/23   Stehler, Oren Bracket, PA-C  rosuvastatin (CRESTOR) 40 MG tablet Take 1 tablet (40 mg total) by mouth daily. 11/22/22   Ardelle Balls, PA-C      Allergies    Codeine    Review of Systems   Review of Systems  Cardiovascular:  Positive for chest pain.  All other systems reviewed and are negative.   Physical Exam Updated Vital Signs BP 136/71 (BP Location: Left Arm)   Pulse (!) 56   Temp 98.1 F (36.7 C)   Resp 18   Ht 1.803 m (5\' 11" )   Wt 111.2 kg   SpO2 97%   BMI 34.19 kg/m  Physical Exam Vitals and nursing note reviewed.  Constitutional:      General: He is not in acute distress.    Appearance: He is well-developed.  HENT:     Head: Normocephalic and atraumatic.     Mouth/Throat:     Pharynx: No oropharyngeal exudate.  Eyes:     General: No scleral icterus.       Right eye: No discharge.        Left eye: No discharge.     Conjunctiva/sclera:  Conjunctivae normal.     Pupils: Pupils are equal, round, and reactive to light.  Neck:     Thyroid: No thyromegaly.     Vascular: No JVD.  Cardiovascular:     Rate and Rhythm: Normal rate and regular rhythm.     Heart sounds: Normal heart sounds. No murmur heard.    No friction rub. No gallop.  Pulmonary:     Effort: Pulmonary effort is normal. No respiratory distress.     Breath sounds: Normal breath sounds. No wheezing or rales.  Abdominal:     General: Bowel sounds are normal. There is no distension.     Palpations: Abdomen is soft. There is no mass.     Tenderness: There is no abdominal tenderness.  Musculoskeletal:        General: No tenderness. Normal range of motion.     Cervical back: Normal range of motion and neck supple.     Right lower leg: Edema present.     Left lower leg: No edema.  Lymphadenopathy:     Cervical: No cervical adenopathy.  Skin:    General: Skin is warm and dry.     Findings: No erythema or rash.  Neurological:     Mental Status: He is alert.     Coordination: Coordination normal.  Psychiatric:        Behavior: Behavior normal.     ED Results / Procedures / Treatments   Labs (all labs ordered are listed, but only abnormal results are displayed) Labs Reviewed  BASIC METABOLIC PANEL - Abnormal; Notable for the following components:      Result Value   Calcium 8.7 (*)    All other components within normal limits  CBC - Abnormal; Notable for the following components:   Hemoglobin 12.5 (*)    All other components within normal limits  BASIC METABOLIC PANEL - Abnormal; Notable for the following components:   Calcium 8.5 (*)    All other components within normal limits  CBC - Abnormal; Notable for the following components:   RBC 4.20 (*)    Hemoglobin 12.4 (*)    HCT 38.2 (*)    Platelets 143 (*)    All other components within normal limits  PROTIME-INR  LIPID PANEL  TROPONIN I (HIGH SENSITIVITY)  TROPONIN I (HIGH SENSITIVITY)     EKG None  Radiology No results found.  Procedures Procedures    Medications Ordered in ED Medications  nitroGLYCERIN (NITROGLYN) 2 % ointment 0.5 inch (0.5 inches Topical Given 02/08/23 1435)    ED Course/ Medical Decision Making/ A&P  Medical Decision Making Amount and/or Complexity of Data Reviewed Labs: ordered. Radiology: ordered.  Risk Prescription drug management. Decision regarding hospitalization.   \ This patient presents to the ED for concern of chest discomfort with radiation to the jaw and the elbow, this involves an extensive number of treatment options, and is a complaint that carries with it a high risk of complications and morbidity.  The differential diagnosis includes coronary disease, obstruction, NSTEMI, unstable angina, less likely to be pneumonia, pneumothorax or neurologic cause.  He does report a history of some type of cervical radiculopathy but has not had any of those symptoms at this time   Co morbidities that complicate the patient evaluation  Known coronary disease status post bypass grafting   Additional history obtained:  Additional history obtained from family member and the medical record External records from outside source obtained and reviewed including operative notes from April 4   Lab Tests:  I Ordered, and personally interpreted labs.  The pertinent results include: No acute findings, normal troponin   Imaging Studies ordered:  I ordered imaging studies including no acute infiltrates, atelectasis I independently visualized and interpreted imaging which showed no acute findings I agree with the radiologist interpretation   Cardiac Monitoring: / EKG:  The patient was maintained on a cardiac monitor.  I personally viewed and interpreted the cardiac monitored which showed an underlying rhythm of: Normal sinus rhythm, EKG performed February 08, 2023 at 2:14 PM is unremarkable except for some  nonspecific ST segments and a PVC.   Consultations Obtained:  I requested consultation with the hospitalist,  and discussed lab and imaging findings as well as pertinent plan - they recommend: Admission   Problem List / ED Course / Critical interventions / Medication management  CAD concerning - needing admit for r/o and cp obs  I have reviewed the patients home medicines and have made adjustments as needed   Social Determinants of Health:  none   Test / Admission - Considered:  Will admit         Final Clinical Impression(s) / ED Diagnoses Final diagnoses:  Nonspecific chest pain    Rx / DC Orders ED Discharge Orders          Ordered    aspirin EC 81 MG tablet  Daily with breakfast        02/09/23 1147    donepezil (ARICEPT ODT) 10 MG disintegrating tablet  Daily at bedtime        02/09/23 1147    nitroGLYCERIN (NITROSTAT) 0.4 MG SL tablet  Every 5 min PRN        02/09/23 1147    Increase activity slowly        02/09/23 1147    Diet - low sodium heart healthy        02/09/23 1147    Discharge instructions       Comments: 1)Please STop Meloxicam (Mobic) while on aspirin and Plavix due to increased risk of bleeding and kidney complications 2)Avoid ibuprofen/Advil/Aleve/Motrin/Goody Powders/Naproxen/BC powders/Meloxicam/Diclofenac/Indomethacin and other Nonsteroidal anti-inflammatory medications as these will make you more likely to bleed and can cause stomach ulcers, can also cause Kidney problems. 3) please continue aspirin and Plavix as previously prescribed to prevent heart attack 4)Please follow-up with cardiology PA Sharlene Dory on 03/10/2023 5) okay to take nitroglycerin tablets under the tongue as needed for chest discomfort   02/09/23 1147    Call MD for:  temperature >100.4        02/09/23  1147    Call MD for:  persistant nausea and vomiting        02/09/23 1147    Call MD for:  difficulty breathing, headache or visual disturbances         02/09/23 1147    Call MD for:  persistant dizziness or light-headedness        02/09/23 1147              Eber Hong, MD 02/18/23 [phone]

## 2023-02-08 NOTE — Assessment & Plan Note (Signed)
CABG x 3 (LIMA to LAD, SVG to OM and SVG to Diagonal,.

## 2023-02-08 NOTE — Progress Notes (Signed)
Patient arrived to floor, alert and oriented x 4.  Patient skin intact, just recent surgical scar to leg and chest from recent CABG.

## 2023-02-08 NOTE — ED Notes (Signed)
Pt transported to Xray via stretcher

## 2023-02-08 NOTE — Assessment & Plan Note (Signed)
Resume Celexa 

## 2023-02-08 NOTE — ED Notes (Signed)
ED TO INPATIENT HANDOFF REPORT  ED Nurse Name and Phone #: [phone]  S Name/Age/Gender Christian Davis 67 y.o. male Room/Bed: APA02/APA02  Code Status   Code Status: Prior  Home/SNF/Other Home Patient oriented to: self, place, time, and situation Is this baseline? Yes   Triage Complete: Triage complete  Chief Complaint Chest pain [R07.9]  Triage Note Pt via POV c/o central chest pain radiating to jaw and left arm since about an hour PTA. Pt had triple CABG on April 6. Pt states he "feels like something is just not right" but denies nausea, dizziness, diaphoresis.    Allergies Allergies  Allergen Reactions   Codeine Anxiety    Level of Care/Admitting Diagnosis ED Disposition     ED Disposition  Admit   Condition  --   Comment  Hospital Area: [facility] [[id number]]  Level of Care: Telemetry [5]  Covid Evaluation: Asymptomatic - no recent exposure (last 10 days) testing not required  Diagnosis: Chest pain [308657]  Admitting Physician: Onnie Boer [[id number]]  Attending Physician: Onnie Boer [id number]          B Medical/Surgery History Past Medical History:  Diagnosis Date   Arthritis    Cancer (HCC)    Coronary artery calcification seen on CT scan    Depression    GERD (gastroesophageal reflux disease)    H/O asbestos exposure    History of kidney stones    Past Surgical History:  Procedure Laterality Date   COLONOSCOPY WITH PROPOFOL N/A 08/23/2022   Procedure: COLONOSCOPY WITH PROPOFOL;  Surgeon: Lanelle Bal, DO;  Location: [facility] ENDO [facility];  Service: Endoscopy;  Laterality: N/A;  9:45am, asa 3   CORONARY ARTERY BYPASS GRAFT N/A 11/18/2022   Procedure: CORONARY ARTERY BYPASS GRAFTING (CABG) x THREE BYPASSES USING OPEN LEFT INTERNAL MAMMARY ARTERY AND OPEN HARVESTED RIGHT GREATER SAPHENOUS VEIN;  Surgeon: Loreli Slot, MD;  Location: [facility] OR;  Service: Open Heart Surgery;  Laterality: N/A;   CORONARY PRESSURE/FFR STUDY N/A  11/16/2022   Procedure: INTRAVASCULAR PRESSURE WIRE/FFR STUDY;  Surgeon: Yvonne Kendall, MD;  Location: [facility] INVASIVE CV LAB;  Service: Cardiovascular;  Laterality: N/A;   FRACTURE SURGERY Left 2002   LEFT HEART CATH AND CORONARY ANGIOGRAPHY N/A 11/16/2022   Procedure: LEFT HEART CATH AND CORONARY ANGIOGRAPHY;  Surgeon: Yvonne Kendall, MD;  Location: [facility] INVASIVE CV LAB;  Service: Cardiovascular;  Laterality: N/A;   NECK SURGERY  1990   POLYPECTOMY  08/23/2022   Procedure: POLYPECTOMY;  Surgeon: Lanelle Bal, DO;  Location: [facility];  Service: Endoscopy;;   RADIAL ARTERY HARVEST Left 11/18/2022   Procedure: ATTEMPTED RADIAL ARTERY HARVEST;  Surgeon: Loreli Slot, MD;  Location: [facility] OR;  Service: Open Heart Surgery;  Laterality: Left;   TEE WITHOUT CARDIOVERSION N/A 11/18/2022   Procedure: TRANSESOPHAGEAL ECHOCARDIOGRAM;  Surgeon: Loreli Slot, MD;  Location: [facility] OR;  Service: Open Heart Surgery;  Laterality: N/A;     A IV Location/Drains/Wounds Patient Lines/Drains/Airways Status     Active Line/Drains/Airways     Name Placement date Placement time Site Days   Peripheral IV 02/08/23 20 G Right Antecubital 02/08/23  1513  Antecubital  less than 1            Intake/Output Last 24 hours No intake or output data in the 24 hours ending 02/08/23 1917  Labs/Imaging Results for orders placed or performed during the hospital encounter of 02/08/23 (from the past 48 hour(s))  Basic metabolic panel  Status: Abnormal   Collection Time: 02/08/23  3:11 PM  Result Value Ref Range   Sodium 136 135 - 145 mmol/L   Potassium 4.2 3.5 - 5.1 mmol/L   Chloride 103 98 - 111 mmol/L   CO2 26 22 - 32 mmol/L   Glucose, Bld 95 70 - 99 mg/dL    Comment: Glucose reference range applies only to samples taken after fasting for at least 8 hours.   BUN 13 8 - 23 mg/dL   Creatinine, Ser 8.11 0.61 - 1.24 mg/dL   Calcium 8.7 (L) 8.9 - 10.3 mg/dL   GFR, Estimated >91 >47 mL/min    Comment:  (NOTE) Calculated using the CKD-EPI Creatinine Equation (2021)    Anion gap 7 5 - 15    Comment: Performed at [facility], [address], [place], [place] [address]  CBC     Status: Abnormal   Collection Time: 02/08/23  3:11 PM  Result Value Ref Range   WBC 6.3 4.0 - 10.5 K/uL   RBC 4.31 4.22 - 5.81 MIL/uL   Hemoglobin 12.5 (L) 13.0 - 17.0 g/dL   HCT 21.3 08.6 - 57.8 %   MCV 91.0 80.0 - 100.0 fL   MCH 29.0 26.0 - 34.0 pg   MCHC 31.9 30.0 - 36.0 g/dL   RDW 46.9 62.9 - 52.8 %   Platelets 154 150 - 400 K/uL   nRBC 0.0 0.0 - 0.2 %    Comment: Performed at [facility], [address] [place], [place] [address]  Troponin I (High Sensitivity)     Status: None   Collection Time: 02/08/23  3:11 PM  Result Value Ref Range   Troponin I (High Sensitivity) 9 <18 ng/L    Comment: (NOTE) Elevated high sensitivity troponin I (hsTnI) values and significant  changes across serial measurements may suggest ACS but many other  chronic and acute conditions are known to elevate hsTnI results.  Refer to the "Links" section for chest pain algorithms and additional  guidance. Performed at [facility], [address], [place], [place] [address]   Protime-INR (order if Patient is taking Coumadin / Warfarin)     Status: None   Collection Time: 02/08/23  3:11 PM  Result Value Ref Range   Prothrombin Time 14.8 11.4 - 15.2 seconds   INR 1.1 0.8 - 1.2    Comment: (NOTE) INR goal varies based on device and disease states. Performed at [facility], [address], [place], [place] [address]   Troponin I (High Sensitivity)     Status: None   Collection Time: 02/08/23  4:52 PM  Result Value Ref Range   Troponin I (High Sensitivity) 10 <18 ng/L    Comment: (NOTE) Elevated high sensitivity troponin I (hsTnI) values and significant  changes across serial measurements may suggest ACS but many other  chronic and acute conditions are known to elevate hsTnI results.  Refer to the "Links" section for chest pain  algorithms and additional  guidance. Performed at [facility], [address], [place], [place] [address]    DG Chest 2 View  Result Date: 02/08/2023 CLINICAL DATA:  Central chest pain, radiating to jaw and left arm, recent CABG EXAM: CHEST - 2 VIEW COMPARISON:  01/18/2023 FINDINGS: Unchanged cardiac and mediastinal contours, status post median sternotomy and CABG. Resolution of previously noted left pleural effusion. Bibasilar atelectasis. No pneumothorax. No acute osseous abnormality prior ACDF. IMPRESSION: Bibasilar atelectasis. Resolution of previously noted left pleural effusion. Electronically Signed   By:  Wiliam Ke M.D.   On: 02/08/2023 15:03    Pending Labs Unresulted Labs (From admission, onward)    None       Vitals/Pain Today's Vitals   02/08/23 1543 02/08/23 1800 02/08/23 1830 02/08/23 1915  BP: 124/76 (!) 141/74 (!) 142/97 (!) 166/77  Pulse: (!) 52 (!) 54 (!) 56 (!) 56  Resp: 20 18 16 18   Temp: 97.9 F (36.6 C)     TempSrc: Oral     SpO2: 99% 100% 96% 100%  Weight:      Height:      PainSc:        Isolation Precautions No active isolations  Medications Medications  nitroGLYCERIN (NITROGLYN) 2 % ointment 0.5 inch (0.5 inches Topical Given 02/08/23 1435)    Mobility walks     Focused Assessments Cardiac Assessment Handoff:    No results found for: "CKTOTAL", "CKMB", "CKMBINDEX", "TROPONINI" No results found for: "DDIMER" Does the Patient currently have chest pain? No    R Recommendations: See Admitting Provider Note  Report given to:   Additional Notes:Nitropaste still on chest

## 2023-02-08 NOTE — H&P (Addendum)
History and Physical    Christian Davis ZOX:[id number] DOB: 1955-11-09 DOA: 02/08/2023  PCP: Kirstie Peri, MD   Patient coming from: Home  I have personally briefly reviewed patient's old medical records in [facility]  Chief Complaint: Chest Pain  HPI: Christian Davis is a 67 y.o. male with medical history significant for anxiety, tremors, CABG.  Patient presented to the ED with complaints of chest pain that started while he was sitting outside on his porch.  Describes central chest pain, that radiated to his jaw and arm with associated dizziness but no difficulty breathing.  Spouse reported chest pain lasted about 30 minutes.  On arrival to the ED chest pain was improving and he is currently chest pain-free.  Patient is unable to tell me if this chest pain was similar to recent chest pain requiring CABG. Patient has been going for physical therapy, and has had no difficulty breathing or chest pain with his activities. Recently diagnosed with cognitive decline, he is on Aricept.  Patient's spouse helps him with his medications, he has been compliant with all his medications.  Recent CABG at [facility] 11/18/2022, by Dr. Dorris Fetch.  Has had postop follow-ups, last follow-up 01/18/2023 with cardiothoracic surgery, patient doing well.  ED Course: Temperature 96.8.  Heart rate 52-64.  Respiratory rate 13-20.  Blood pressure systolic 124-147.  O2 sats greater than 96% on room air. Troponin 9 > 10.  Chest x-ray shows bibasilar atelectasis and resolved left pleural effusion. EKG shows upright T waves in V3 - V6 , compared to prior EKG 4/5 with inverted T waves.  Review of Systems: As per HPI all other systems reviewed and negative.  Past Medical History:  Diagnosis Date   Arthritis    Cancer [facility])    Coronary artery calcification seen on CT scan    Depression    GERD (gastroesophageal reflux disease)    H/O asbestos exposure    History of kidney stones     Past Surgical  History:  Procedure Laterality Date   COLONOSCOPY WITH PROPOFOL N/A 08/23/2022   Procedure: COLONOSCOPY WITH PROPOFOL;  Surgeon: Lanelle Bal, DO;  Location: [facility] ENDO SUITE;  Service: Endoscopy;  Laterality: N/A;  9:45am, asa 3   CORONARY ARTERY BYPASS GRAFT N/A 11/18/2022   Procedure: CORONARY ARTERY BYPASS GRAFTING (CABG) x THREE BYPASSES USING OPEN LEFT INTERNAL MAMMARY ARTERY AND OPEN HARVESTED RIGHT GREATER SAPHENOUS VEIN;  Surgeon: Loreli Slot, MD;  Location: [facility] OR;  Service: Open Heart Surgery;  Laterality: N/A;   CORONARY PRESSURE/FFR STUDY N/A 11/16/2022   Procedure: INTRAVASCULAR PRESSURE WIRE/FFR STUDY;  Surgeon: Yvonne Kendall, MD;  Location: [facility] INVASIVE CV LAB;  Service: Cardiovascular;  Laterality: N/A;   FRACTURE SURGERY Left 2002   LEFT HEART CATH AND CORONARY ANGIOGRAPHY N/A 11/16/2022   Procedure: LEFT HEART CATH AND CORONARY ANGIOGRAPHY;  Surgeon: Yvonne Kendall, MD;  Location: [facility] INVASIVE CV LAB;  Service: Cardiovascular;  Laterality: N/A;   NECK SURGERY  1990   POLYPECTOMY  08/23/2022   Procedure: POLYPECTOMY;  Surgeon: Lanelle Bal, DO;  Location: [facility] ENDO SUITE;  Service: Endoscopy;;   RADIAL ARTERY HARVEST Left 11/18/2022   Procedure: ATTEMPTED RADIAL ARTERY HARVEST;  Surgeon: Loreli Slot, MD;  Location: [facility] OR;  Service: Open Heart Surgery;  Laterality: Left;   TEE WITHOUT CARDIOVERSION N/A 11/18/2022   Procedure: TRANSESOPHAGEAL ECHOCARDIOGRAM;  Surgeon: Loreli Slot, MD;  Location: [facility] OR;  Service: Open Heart Surgery;  Laterality: N/A;  reports that he has never smoked. He has never been exposed to tobacco smoke. He has never used smokeless tobacco. He reports current alcohol use of about 21.0 standard drinks of alcohol per week. He reports that he does not use drugs.  Allergies  Allergen Reactions   Codeine Anxiety    Family History  Problem Relation Age of Onset   Alzheimer's disease Mother    Hypertension Father     Prior to  Admission medications   Medication Sig Start Date End Date Taking? Authorizing Provider  acetaminophen (TYLENOL) 500 MG tablet Take 2 tablets (1,000 mg total) by mouth every 6 (six) hours as needed. 11/22/22   Ardelle Balls, PA-C  aspirin EC 81 MG tablet Take 1 tablet (81 mg total) by mouth daily. Swallow whole. 11/22/22   Ardelle Balls, PA-C  azelastine (ASTELIN) 0.1 % nasal spray Place 2 sprays into both nostrils at bedtime. Use in each nostril as directed    [provider]  cholecalciferol (VITAMIN D3) 25 MCG (1000 UNIT) tablet Take 1,000 Units by mouth daily.    [provider]  citalopram (CELEXA) 20 MG tablet Take 20 mg by mouth daily. 08/03/22   [provider]  clopidogrel (PLAVIX) 75 MG tablet Take 1 tablet (75 mg total) by mouth daily. 01/18/23   Stehler, Oren Bracket, PA-C  cyanocobalamin (VITAMIN B12) 1000 MCG tablet Take 1,000 mcg by mouth daily.    [provider]  donepezil (ARICEPT ODT) 10 MG disintegrating tablet Take 10 mg by mouth at bedtime.    [provider]  furosemide (LASIX) 40 MG tablet Take 1 tablet (40 mg total) by mouth daily. 01/18/23   Stehler, Oren Bracket, PA-C  levocetirizine (XYZAL) 5 MG tablet Take 5 mg by mouth daily.    [provider]  losartan (COZAAR) 25 MG tablet Take 1 tablet (25 mg total) by mouth daily. 11/22/22   Ardelle Balls, PA-C  meloxicam (MOBIC) 7.5 MG tablet Take 1 tablet (7.5 mg total) by mouth daily as needed for pain. 12/20/22   Ardelle Balls, PA-C  Menthol, Topical Analgesic, (BLUE-EMU MAXIMUM STRENGTH EX) Apply 1 Application topically daily as needed (pain).    [provider]  Metoprolol Tartrate 37.5 MG TABS Take 1 tablet (37.5 mg total) by mouth 2 (two) times daily. 01/18/23   Stehler, Oren Bracket, PA-C  potassium chloride SA (KLOR-CON M) 20 MEQ tablet Take 1 tablet (20 mEq total) by mouth daily. 01/18/23   Stehler, Oren Bracket, PA-C  predniSONE (STERAPRED UNI-PAK 21 TAB) 10  MG (21) TBPK tablet Take 6 tablets (60 mg total) by mouth daily for 1 day, THEN 5 tablets (50 mg total) daily for 1 day, THEN 4 tablets (40 mg total) daily for 1 day, THEN 3 tablets (30 mg total) daily for 1 day, THEN 2 tablets (20 mg total) daily for 1 day, THEN 1 tablet (10 mg total) daily for 1 day. 01/18/23   Stehler, Oren Bracket, PA-C  rosuvastatin (CRESTOR) 40 MG tablet Take 1 tablet (40 mg total) by mouth daily. 11/22/22   Ardelle Balls, PA-C    Physical Exam: Vitals:   02/08/23 1412 02/08/23 1415 02/08/23 1543 02/08/23 1800  BP:  (!) 147/80 124/76 (!) 141/74  Pulse:  64 (!) 52 (!) 54  Resp:  13 20 18   Temp:  (!) 96.8 F (36 C) 97.9 F (36.6 C)   TempSrc:  Axillary Oral   SpO2:  99% 99% 100%  Weight: 111.1  kg     Height: 5\' 11"  (1.803 m)       Constitutional: NAD, calm, comfortable Vitals:   02/08/23 1412 02/08/23 1415 02/08/23 1543 02/08/23 1800  BP:  (!) 147/80 124/76 (!) 141/74  Pulse:  64 (!) 52 (!) 54  Resp:  13 20 18   Temp:  (!) 96.8 F (36 C) 97.9 F (36.6 C)   TempSrc:  Axillary Oral   SpO2:  99% 99% 100%  Weight: 111.1 kg     Height: 5\' 11"  (1.803 m)      Eyes: PERRL, lids and conjunctivae normal ENMT: Mucous membranes are moist.  Neck: normal, supple, no masses, no thyromegaly Respiratory: clear to auscultation bilaterally, no wheezing, no crackles. Normal respiratory effort. No accessory muscle use.  Cardiovascular: Clean surgical healed scar to mid chest, regular rate and rhythm, no murmurs / rubs / gallops.  Trace edema to ankles of both feet worse on the right.    Abdomen: no tenderness, no masses palpated. No hepatosplenomegaly. Bowel sounds positive.  Musculoskeletal: no clubbing / cyanosis. No joint deformity upper and lower extremities. Good ROM, no contractures. Normal muscle tone.  Skin: Clean surgical scar to medial aspect of right lower extremity, no rashes, lesions, ulcers. No induration Neurologic: No facial symmetry, speech fluent, moving  extremities spontaneously.  Psychiatric: Normal judgment and insight. Alert and oriented x 4. Normal mood.   Labs on Admission: I have personally reviewed following labs and imaging studies  CBC: Recent Labs  Lab 02/08/23 1511  WBC 6.3  HGB 12.5*  HCT 39.2  MCV 91.0  PLT 154   Basic Metabolic Panel: Recent Labs  Lab 02/08/23 1511  NA 136  K 4.2  CL 103  CO2 26  GLUCOSE 95  BUN 13  CREATININE 0.80  CALCIUM 8.7*   Coagulation Profile: Recent Labs  Lab 02/08/23 1511  INR 1.1    Radiological Exams on Admission: DG Chest 2 View  Result Date: 02/08/2023 CLINICAL DATA:  Central chest pain, radiating to jaw and left arm, recent CABG EXAM: CHEST - 2 VIEW COMPARISON:  01/18/2023 FINDINGS: Unchanged cardiac and mediastinal contours, status post median sternotomy and CABG. Resolution of previously noted left pleural effusion. Bibasilar atelectasis. No pneumothorax. No acute osseous abnormality prior ACDF. IMPRESSION: Bibasilar atelectasis. Resolution of previously noted left pleural effusion. Electronically Signed   By: Wiliam Ke M.D.   On: 02/08/2023 15:03    EKG: Independently reviewed.  Sinus bradycardia rate 56, QTc 437.  Upright T waves V3 through V6.  Previous EKG shows T wave inversions in leads V3 through V6.  Assessment/Plan Principal Problem:   Chest pain Active Problems:   Essential tremor   Generalized anxiety disorder   S/P CABG x 3  Assessment and Plan: * Chest pain Chest pain in the setting of recent CABG.  Troponins 9 > 10.   EKG shows upright T waves in V3 through V6, previous post-op EKG- T waves V3 through V6 with inverted.  Chest pain has since resolved.  He has been undergoing physical therapy without cardiac or respiratory symptoms with activity. Last echo-TEE 4/4 shows EF of 60 to 65%.  Reports compliance with all his medications, but he does not know the names of what he takes.  I am unable to reach his wife to confirm. -I talked to cardiology, Dr.  Izora Ribas, observe overnight and Cards team can see in a.m. -Repeat EKG in the morning - Resume aspirin, Plavix, crestor, losartan, metoprolol -Nitro as needed  S/P CABG x 3  CABG x 3 (LIMA to LAD, SVG to OM and SVG to Diagonal,.   Generalized anxiety disorder Resume Celexa   DVT prophylaxis: Lovenox Code Status: FULL- confirmed with patient at bedside Family Communication: None at bedside. I called spouse twice, goes to voicemail.  Did not leave message. Disposition Plan: ~ 2 days Consults called: Cards Admission status:  Obs tele    Author: Onnie Boer, MD 02/08/2023 9:00 PM  For on call review [email or web address].

## 2023-02-08 NOTE — ED Triage Notes (Signed)
Pt via POV c/o central chest pain radiating to jaw and left arm since about an hour PTA. Pt had triple CABG on April 6. Pt states he "feels like something is just not right" but denies nausea, dizziness, diaphoresis.

## 2023-02-08 NOTE — Assessment & Plan Note (Addendum)
Chest pain in the setting of recent CABG.  Troponins 9 > 10.   EKG shows upright T waves in V3 through V6, previous post-op EKG- T waves V3 through V6 with inverted.  Chest pain has since resolved.  He has been undergoing physical therapy without cardiac or respiratory symptoms with activity. Last echo-TEE 4/4 shows EF of 60 to 65%.  Reports compliance with all his medications, but he does not know the names of what he takes.  I am unable to reach his wife to confirm. -I talked to cardiology, Dr. Izora Ribas, observe overnight and Cards team can see in a.m. -Repeat EKG in the morning - Resume aspirin, Plavix, crestor, losartan, metoprolol -Nitro as needed

## 2023-02-09 ENCOUNTER — Observation Stay (HOSPITAL_BASED_OUTPATIENT_CLINIC_OR_DEPARTMENT_OTHER): Payer: Medicare Other

## 2023-02-09 ENCOUNTER — Encounter (HOSPITAL_COMMUNITY): Payer: Medicare Other

## 2023-02-09 ENCOUNTER — Other Ambulatory Visit (HOSPITAL_COMMUNITY): Payer: Self-pay | Admitting: *Deleted

## 2023-02-09 DIAGNOSIS — I10 Essential (primary) hypertension: Secondary | ICD-10-CM | POA: Diagnosis not present

## 2023-02-09 DIAGNOSIS — E7849 Other hyperlipidemia: Secondary | ICD-10-CM | POA: Diagnosis not present

## 2023-02-09 DIAGNOSIS — I25810 Atherosclerosis of coronary artery bypass graft(s) without angina pectoris: Secondary | ICD-10-CM | POA: Diagnosis not present

## 2023-02-09 DIAGNOSIS — R0789 Other chest pain: Principal | ICD-10-CM

## 2023-02-09 DIAGNOSIS — R079 Chest pain, unspecified: Secondary | ICD-10-CM | POA: Diagnosis not present

## 2023-02-09 DIAGNOSIS — F411 Generalized anxiety disorder: Secondary | ICD-10-CM | POA: Diagnosis not present

## 2023-02-09 LAB — BASIC METABOLIC PANEL
Anion gap: 9 (ref 5–15)
BUN: 12 mg/dL (ref 8–23)
CO2: 25 mmol/L (ref 22–32)
Calcium: 8.5 mg/dL — ABNORMAL LOW (ref 8.9–10.3)
Chloride: 102 mmol/L (ref 98–111)
Creatinine, Ser: 0.69 mg/dL (ref 0.61–1.24)
GFR, Estimated: >60 mL/min (ref >60)
Glucose, Bld: 86 mg/dL (ref 70–99)
Potassium: 4.1 mmol/L (ref 3.5–5.1)
Sodium: 136 mmol/L (ref 135–145)

## 2023-02-09 LAB — LIPID PANEL
Cholesterol: 126 mg/dL (ref 0–200)
HDL: 53 mg/dL (ref >40)
LDL Cholesterol: 56 mg/dL (ref 0–99)
Total CHOL/HDL Ratio: 2.4 RATIO
Triglycerides: 83 mg/dL (ref <150)
VLDL: 17 mg/dL (ref 0–40)

## 2023-02-09 LAB — CBC
HCT: 38.2 % — ABNORMAL LOW (ref 39.0–52.0)
Hemoglobin: 12.4 g/dL — ABNORMAL LOW (ref 13.0–17.0)
MCH: 29.5 pg (ref 26.0–34.0)
MCHC: 32.5 g/dL (ref 30.0–36.0)
MCV: 91 fL (ref 80.0–100.0)
Platelets: 143 10*3/uL — ABNORMAL LOW (ref 150–400)
RBC: 4.2 MIL/uL — ABNORMAL LOW (ref 4.22–5.81)
RDW: 14.6 % (ref 11.5–15.5)
WBC: 5.6 10*3/uL (ref 4.0–10.5)
nRBC: 0 % (ref 0.0–0.2)

## 2023-02-09 LAB — ECHOCARDIOGRAM LIMITED
Height: 71 in
S' Lateral: 3 cm
Weight: 3922.42 oz

## 2023-02-09 MED ORDER — DONEPEZIL HCL 10 MG PO TBDP: 10.0000 mg | ORAL_TABLET | Freq: Every day | ORAL | 3 refills | Status: AC

## 2023-02-09 MED ORDER — ASPIRIN 81 MG PO TBEC: 81.0000 mg | DELAYED_RELEASE_TABLET | Freq: Every day | ORAL | 12 refills | Status: DC

## 2023-02-09 MED ORDER — NITROGLYCERIN 0.4 MG SL SUBL: 0.4000 mg | SUBLINGUAL_TABLET | SUBLINGUAL | 3 refills | Status: AC | PRN

## 2023-02-09 MED ORDER — PERFLUTREN LIPID MICROSPHERE
1.0000 mL | INTRAVENOUS | Status: DC | PRN
  Administered 2023-02-09: 5 mL via INTRAVENOUS

## 2023-02-09 NOTE — Progress Notes (Signed)
Vitals stable, no c/o pain noted. Pt slept through the night.

## 2023-02-09 NOTE — Progress Notes (Addendum)
Transition of Care [facility]) - Inpatient Brief Assessment   Patient Details  Name: Christian Davis MRN: [id number] Date of Birth: 1955-12-05  Transition of Care [facility] CM/SW Contact:    Leitha Bleak, RN Phone Number: 02/09/2023, 10:24 AM   Clinical Narrative:  Patient admitted in OBS with chest pain. Echo results pending. No need identified, TOC following.   Transition of Care Asessment: Insurance and Status: Insurance coverage has been reviewed Patient has primary care physician: Yes Home environment has been reviewed: Home with spouse Prior level of function:: independent Prior/Current Home Services: No current home services Social Determinants of Health Reivew: SDOH reviewed no interventions necessary Readmission risk has been reviewed: Yes Transition of care needs: no transition of care needs at this time

## 2023-02-09 NOTE — Progress Notes (Addendum)
Patient discharged with instructions given on medications,and follow up visits,patient verbalized understanding. Prescriptions sent to Pharmacy of choice documented on AVS. IV discontinued,catheter intact. Accompanied by staff to an awaiting vehicle. 

## 2023-02-09 NOTE — Discharge Instructions (Signed)
1)Please STop Meloxicam (Mobic) while on aspirin and Plavix due to increased risk of bleeding and kidney complications 2)Avoid ibuprofen/Advil/Aleve/Motrin/Goody Powders/Naproxen/BC powders/Meloxicam/Diclofenac/Indomethacin and other Nonsteroidal anti-inflammatory medications as these will make you more likely to bleed and can cause stomach ulcers, can also cause Kidney problems. 3) please continue aspirin and Plavix as previously prescribed to prevent heart attack 4)Please follow-up with cardiology PA Sharlene Dory on 03/10/2023 5) okay to take nitroglycerin tablets under the tongue as needed for chest discomfort

## 2023-02-09 NOTE — Consult Note (Signed)
Cardiology Consultation   Patient ID: Christian Davis MRN: [id number]; DOB: July 11, 1956  Admit date: 02/08/2023 Date of Consult: 02/09/2023  PCP:  Kirstie Peri, MD   [facility] Providers Cardiologist:  Marjo Bicker, MD        Patient Profile:   Christian Davis is a 67 y.o. male with a hx of CAD (s/p CABG in 11/2022 with LIAM-LAD, SVG-D1 and SVG-OM), pulmonary asbestos, HTN, HLD and arthritis who is being seen 02/09/2023 for the evaluation of chest pain at the request of Dr. Mariea Clonts.  History of Present Illness:   Christian Davis was admitted to [facility] in 11/2022 after undergoing an Exercise Myoview and developing significant chest discomfort with this. He was transferred to [facility] and cardiac catheterization showed severe two-vessel CAD and CT Surgery consult was recommended for consideration of CABG. EF was mildly reduced at 45 to 50% by cath and norma at 60-65% by echo. Underwent CABG on 11/18/2022 and progressed well in the post-operative setting. Was discharged on ASA 81 mg daily, Plavix 75 mg daily, losartan 25 mg daily, Lopressor 37.5 mg twice daily and Crestor 40 mg daily.  He did see Sharlene Dory, NP in 11/2022 and denied any recent anginal symptoms. Was cleared to proceed with cardiac rehab.  He presented to [facility] ED on 02/08/2023 for evaluation of centralized chest pain. In talking with the patient and his wife today, he reports being in his normal state of health until yesterday when he developed an episode of chest discomfort while sitting down and resting. Reports it was a sharp pain along his precordium which lasted for a few seconds and spontaneously resolved. He denies any recurrent pain overnight or this morning. No associated dyspnea, nausea, vomiting or diaphoresis. Says he has been participating in cardiac rehab without anginal symptoms. No recent orthopnea or PND. Does have intermittent lower extremity edema which has been present since his surgery  and overall stable.  Initial labs show WBC 6.3, Hgb 12.5, platelets 154, Na+ 136, K+ 4.2 and creatinine 0.80. Initial and repeat Hs Troponin negative at 9 and 10. CXR showing bibasilar atelectasis and resolution of prior left pleural effusion. EKG showed sinus bradycardia, HR 56 with no acute ST abnormalities.    Past Medical History:  Diagnosis Date   Arthritis    Cancer [facility])    Coronary artery calcification seen on CT scan    Depression    GERD (gastroesophageal reflux disease)    H/O asbestos exposure    History of kidney stones     Past Surgical History:  Procedure Laterality Date   COLONOSCOPY WITH PROPOFOL N/A 08/23/2022   Procedure: COLONOSCOPY WITH PROPOFOL;  Surgeon: Lanelle Bal, DO;  Location: [facility] ENDO SUITE;  Service: Endoscopy;  Laterality: N/A;  9:45am, asa 3   CORONARY ARTERY BYPASS GRAFT N/A 11/18/2022   Procedure: CORONARY ARTERY BYPASS GRAFTING (CABG) x THREE BYPASSES USING OPEN LEFT INTERNAL MAMMARY ARTERY AND OPEN HARVESTED RIGHT GREATER SAPHENOUS VEIN;  Surgeon: Loreli Slot, MD;  Location: [facility] OR;  Service: Open Heart Surgery;  Laterality: N/A;   CORONARY PRESSURE/FFR STUDY N/A 11/16/2022   Procedure: INTRAVASCULAR PRESSURE WIRE/FFR STUDY;  Surgeon: Yvonne Kendall, MD;  Location: [facility] INVASIVE CV LAB;  Service: Cardiovascular;  Laterality: N/A;   FRACTURE SURGERY Left 2002   LEFT HEART CATH AND CORONARY ANGIOGRAPHY N/A 11/16/2022   Procedure: LEFT HEART CATH AND CORONARY ANGIOGRAPHY;  Surgeon: Yvonne Kendall, MD;  Location: [facility] INVASIVE CV LAB;  Service:  Cardiovascular;  Laterality: N/A;   NECK SURGERY  1990   POLYPECTOMY  08/23/2022   Procedure: POLYPECTOMY;  Surgeon: Lanelle Bal, DO;  Location: [facility]ITE;  Service: Endoscopy;;   RADIAL ARTERY HARVEST Left 11/18/2022   Procedure: ATTEMPTED RADIAL ARTERY HARVEST;  Surgeon: Loreli Slot, MD;  Location: [facility] OR;  Service: Open Heart Surgery;  Laterality: Left;   TEE WITHOUT CARDIOVERSION N/A  11/18/2022   Procedure: TRANSESOPHAGEAL ECHOCARDIOGRAM;  Surgeon: Loreli Slot, MD;  Location[facility] OR;  Service: Open Heart Surgery;  Laterality: N/A;     Home Medications:  Prior to Admission medications   Medication Sig Start Date End Date Taking? Authorizing Provider  acetaminophen (TYLENOL) 500 MG tablet Take 2 tablets (1,000 mg total) by mouth every 6 (six) hours as needed. 11/22/22   Ardelle Balls, PA-C  aspirin EC 81 MG tablet Take 1 tablet (81 mg total) by mouth daily. Swallow whole. 11/22/22   Ardelle Balls, PA-C  azelastine (ASTELIN) 0.1 % nasal spray Place 2 sprays into both nostrils at bedtime. Use in each nostril as directed    [provider]  cholecalciferol (VITAMIN D3) 25 MCG (1000 UNIT) tablet Take 1,000 Units by mouth daily.    [provider]  citalopram (CELEXA) 20 MG tablet Take 20 mg by mouth daily. 08/03/22   [provider]  clopidogrel (PLAVIX) 75 MG tablet Take 1 tablet (75 mg total) by mouth daily. 01/18/23   Stehler, Oren Bracket, PA-C  cyanocobalamin (VITAMIN B12) 1000 MCG tablet Take 1,000 mcg by mouth daily.    [provider]  donepezil (ARICEPT ODT) 10 MG disintegrating tablet Take 10 mg by mouth at bedtime.    [provider]  furosemide (LASIX) 40 MG tablet Take 1 tablet (40 mg total) by mouth daily. 01/18/23   Stehler, Oren Bracket, PA-C  levocetirizine (XYZAL) 5 MG tablet Take 5 mg by mouth daily.    [provider]  losartan (COZAAR) 25 MG tablet Take 1 tablet (25 mg total) by mouth daily. 11/22/22   Ardelle Balls, PA-C  meloxicam (MOBIC) 7.5 MG tablet Take 1 tablet (7.5 mg total) by mouth daily as needed for pain. 12/20/22   Ardelle Balls, PA-C  Menthol, Topical Analgesic, (BLUE-EMU MAXIMUM STRENGTH EX) Apply 1 Application topically daily as needed (pain).    [provider]  Metoprolol Tartrate 37.5 MG TABS Take 1 tablet (37.5 mg total) by mouth 2 (two) times daily. 01/18/23    Stehler, Oren Bracket, PA-C  potassium chloride SA (KLOR-CON M) 20 MEQ tablet Take 1 tablet (20 mEq total) by mouth daily. 01/18/23   Stehler, Oren Bracket, PA-C  predniSONE (STERAPRED UNI-PAK 21 TAB) 10 MG (21) TBPK tablet Take 6 tablets (60 mg total) by mouth daily for 1 day, THEN 5 tablets (50 mg total) daily for 1 day, THEN 4 tablets (40 mg total) daily for 1 day, THEN 3 tablets (30 mg total) daily for 1 day, THEN 2 tablets (20 mg total) daily for 1 day, THEN 1 tablet (10 mg total) daily for 1 day. 01/18/23   Stehler, Oren Bracket, PA-C  rosuvastatin (CRESTOR) 40 MG tablet Take 1 tablet (40 mg total) by mouth daily. 11/22/22   Ardelle Balls, PA-C    Inpatient Medications: Scheduled Meds:  aspirin EC  81 mg Oral Daily   clopidogrel  75 mg Oral Daily   donepezil  10 mg Oral QHS   enoxaparin (LOVENOX) injection  40 mg Subcutaneous  Q24H   losartan  25 mg Oral Daily   metoprolol tartrate  37.5 mg Oral BID   rosuvastatin  40 mg Oral Daily   Continuous Infusions:  PRN Meds: acetaminophen **OR** acetaminophen, ondansetron **OR** ondansetron (ZOFRAN) IV, polyethylene glycol  Allergies:    Allergies  Allergen Reactions   Codeine Anxiety    Social History:   Social History   Socioeconomic History   Marital status: Married    Spouse name: Not on file   Number of children: Not on file   Years of education: Not on file   Highest education level: Not on file  Occupational History   Not on file  Tobacco Use   Smoking status: Never    Passive exposure: Never   Smokeless tobacco: Never  Vaping Use   Vaping Use: Never used  Substance and Sexual Activity   Alcohol use: Yes    Alcohol/week: 21.0 standard drinks of alcohol    Types: 21 Cans of beer per week    Comment: 3 beers per day   Drug use: Never   Sexual activity: Not on file  Other Topics Concern   Not on file  Social History Narrative   Not on file   Social Determinants of Health   Financial Resource Strain: Not on file  Food  Insecurity: No Food Insecurity (02/08/2023)   Hunger Vital Sign    Worried About Running Out of Food in the Last Year: Never true    Ran Out of Food in the Last Year: Never true  Transportation Needs: No Transportation Needs (02/08/2023)   PRAPARE - Administrator, Civil Service (Medical): No    Lack of Transportation (Non-Medical): No  Physical Activity: Not on file  Stress: Not on file  Social Connections: Not on file  Intimate Partner Violence: Not At Risk (02/08/2023)   Humiliation, Afraid, Rape, and Kick questionnaire    Fear of Current or Ex-Partner: No    Emotionally Abused: No    Physically Abused: No    Sexually Abused: No    Family History:    Family History  Problem Relation Age of Onset   Alzheimer's disease Mother    Hypertension Father      ROS:  Please see the history of present illness.   All other ROS reviewed and negative.     Physical Exam/Data:   Vitals:   02/08/23 1946 02/08/23 2351 02/09/23 0327 02/09/23 0738  BP: (!) 163/83 (!) 143/64 123/77 136/71  Pulse: 60   (!) 56  Resp: 19 18 18    Temp: 98.2 F (36.8 C) 98.1 F (36.7 C) 98.1 F (36.7 C) 98.1 F (36.7 C)  TempSrc:      SpO2: 99% 98% 98% 97%  Weight:      Height:        Intake/Output Summary (Last 24 hours) at 02/09/2023 1009 Last data filed at 02/09/2023 0948 Gross per 24 hour  Intake 600 ml  Output --  Net 600 ml      02/08/2023    7:42 PM 02/08/2023    2:12 PM 01/31/2023    9:30 AM  Last 3 Weights  Weight (lbs) 245 lb 2.4 oz 245 lb 244 lb 14.9 oz  Weight (kg) 111.2 kg 111.131 kg 111.1 kg     Body mass index is 34.19 kg/m.  General:  Well nourished, well developed male appearing in no acute distress HEENT: normal Neck: no JVD Vascular: No carotid bruits; Distal pulses 2+ bilaterally Cardiac:  normal S1, S2; RRR; no murmur.  Sternal incision appears to be healing well. Lungs:  clear to auscultation bilaterally, no wheezing, rhonchi or rales  Abd: soft, nontender,  no hepatomegaly  Ext: trace lower extremity edema Musculoskeletal:  No deformities, BUE and BLE strength normal and equal Skin: warm and dry  Neuro:  CNs 2-12 intact, no focal abnormalities noted Psych:  Normal affect   EKG:  The EKG was personally reviewed and demonstrates: Sinus bradycardia, HR 56 with no acute ST abnormalities.   Telemetry:  Telemetry was personally reviewed and demonstrates: Sinus bradycardia, heart rate in 50's to 60's.  Relevant CV Studies:  Cardiac Catheterization: 11/2022 Conclusions: Severe two-vessel coronary artery disease with sequential 50-70% ostial through mid LAD stenoses that are hemodynamically significant by RFR and chronic total occlusion of dominant LCx with left-to-left and right-to-left collaterals. Mildly reduced left ventricular systolic function (LVEF 45-50%) with global hypokinesis. Mild to moderately elevated left heart filling pressure (LVEDP 20-25 mmHg).   Recommendations: Cardiac surgery consultation for CABG. Aggressive secondary prevention of coronary artery disease. Gentle diuresis; furosemide 20 mg IV x 1 has been ordered. Follow-up echocardiogram.   Echocardiogram: 11/2022 IMPRESSIONS     1. Left ventricular ejection fraction, by estimation, is 60 to 65%. The  left ventricle has normal function. Left ventricular endocardial border  not optimally defined to evaluate regional wall motion. Left ventricular  diastolic parameters were normal.   2. Right ventricular systolic function is normal. The right ventricular  size is normal.   3. The mitral valve was not well visualized. No evidence of mitral valve  regurgitation. No evidence of mitral stenosis.   4. The aortic valve is tricuspid. Aortic valve regurgitation is not  visualized. No aortic stenosis is present.   Comparison(s): No prior Echocardiogram.    Laboratory Data:  High Sensitivity Troponin:   Recent Labs  Lab 02/08/23 1511 02/08/23 1652  TROPONINIHS 9 10      Chemistry Recent Labs  Lab 02/08/23 1511 02/09/23 0447  NA 136 136  K 4.2 4.1  CL 103 102  CO2 26 25  GLUCOSE 95 86  BUN 13 12  CREATININE 0.80 0.69  CALCIUM 8.7* 8.5*  GFRNONAA >60 >60  ANIONGAP 7 9    No results for input(s): "PROT", "ALBUMIN", "AST", "ALT", "ALKPHOS", "BILITOT" in the last 168 hours. Lipids No results for input(s): "CHOL", "TRIG", "HDL", "LABVLDL", "LDLCALC", "CHOLHDL" in the last 168 hours.  Hematology Recent Labs  Lab 02/08/23 1511 02/09/23 0447  WBC 6.3 5.6  RBC 4.31 4.20*  HGB 12.5* 12.4*  HCT 39.2 38.2*  MCV 91.0 91.0  MCH 29.0 29.5  MCHC 31.9 32.5  RDW 14.6 14.6  PLT 154 143*   Thyroid No results for input(s): "TSH", "FREET4" in the last 168 hours.  BNPNo results for input(s): "BNP", "PROBNP" in the last 168 hours.  DDimer No results for input(s): "DDIMER" in the last 168 hours.   Radiology/Studies:  DG Chest 2 View  Result Date: 02/08/2023 CLINICAL DATA:  Central chest pain, radiating to jaw and left arm, recent CABG EXAM: CHEST - 2 VIEW COMPARISON:  01/18/2023 FINDINGS: Unchanged cardiac and mediastinal contours, status post median sternotomy and CABG. Resolution of previously noted left pleural effusion. Bibasilar atelectasis. No pneumothorax. No acute osseous abnormality prior ACDF. IMPRESSION: Bibasilar atelectasis. Resolution of previously noted left pleural effusion. Electronically Signed   By: Wiliam Ke M.D.   On: 02/08/2023 15:03     Assessment and Plan:   1. Atypical Chest  Pain - His chest pain the day of admission overall seems atypical for angina as it occurred at rest and only lasted for a few seconds and spontaneously resolved. No anginal symptoms when participating in cardiac rehab. He has ruled out for ACS and EKG is without acute ST changes. Will plan to obtain a limited echocardiogram for reassessment of his EF and wall motion. - If echocardiogram is reassuring, would not anticipate further cardiac testing at this  time. Would recommend providing with Rx for SL NTG to have at home and reviewed instructions of use with the patient and his wife.  2. CAD - He is s/p CABG in 11/2022 with LIAM-LAD, SVG-D1 and SVG-OM. Troponin values have been negative this admission as discussed above. Limited echocardiogram pending. - Continue current medical therapy with ASA 81 mg daily, Plavix 75 mg daily, Lopressor 37.5 mg twice daily and Crestor 40 mg daily.  3. HTN - BP has been well-controlled, at 136/71 on most recent check. Continue current medical therapy with Losartan 25 mg daily and Lopressor 37.5 mg twice daily  4. HLD - LDL was at 151 in 11/2022 and he was started on Crestor 40 mg daily. Does not appear this has been rechecked in the interim and will see if a repeat FLP can be added onto his AM labs.  For questions or updates, please contact [facility] Please consult [email or web address] for contact info under    Signed, Ellsworth Lennox, PA-C  02/09/2023 10:09 AM

## 2023-02-09 NOTE — Progress Notes (Signed)
*  PRELIMINARY RESULTS* Echocardiogram Limited 2-D Echocardiogram  has been performed with [facility].  Stacey Drain 02/09/2023, 11:16 AM

## 2023-02-09 NOTE — Discharge Summary (Signed)
Christian Davis, is a 67 y.o. male  DOB 1956-07-05  MRN [id number].  Admission date:  02/08/2023  Admitting Physician  Onnie Boer, MD  Discharge Date:  02/09/2023   Primary MD  Kirstie Peri, MD  Recommendations for primary care physician for things to follow:   1)Please STop Meloxicam (Mobic) while on aspirin and Plavix due to increased risk of bleeding and kidney complications 2)Avoid ibuprofen/Advil/Aleve/Motrin/Goody Powders/Naproxen/BC powders/Meloxicam/Diclofenac/Indomethacin and other Nonsteroidal anti-inflammatory medications as these will make you more likely to bleed and can cause stomach ulcers, can also cause Kidney problems. 3) please continue aspirin and Plavix as previously prescribed to prevent heart attack 4)Please follow-up with cardiology PA Sharlene Dory on 03/10/2023 5) okay to take nitroglycerin tablets under the tongue as needed for chest discomfort  Admission Diagnosis  Chest pain [R07.9] Nonspecific chest pain [R07.9]   Discharge Diagnosis  Chest pain [R07.9] Nonspecific chest pain [R07.9]    Principal Problem:   Chest pain Active Problems:   Essential tremor   Generalized anxiety disorder   S/P CABG x 3      Past Medical History:  Diagnosis Date   Arthritis    Cancer (HCC)    Coronary artery calcification seen on CT scan    Depression    GERD (gastroesophageal reflux disease)    H/O asbestos exposure    History of kidney stones     Past Surgical History:  Procedure Laterality Date   COLONOSCOPY WITH PROPOFOL N/A 08/23/2022   Procedure: COLONOSCOPY WITH PROPOFOL;  Surgeon: Lanelle Bal, DO;  [facility]: [facility];  Service: Endoscopy;  Laterality: N/A;  9:45am, asa 3   CORONARY ARTERY BYPASS GRAFT N/A 11/18/2022   Procedure: CORONARY ARTERY BYPASS GRAFTING (CABG) x THREE BYPASSES USING OPEN LEFT INTERNAL MAMMARY ARTERY AND OPEN HARVESTED RIGHT GREATER  SAPHENOUS VEIN;  Surgeon: Loreli Slot, MD;  Location: [facility] OR;  Service: Open Heart Surgery;  Laterality: N/A;   CORONARY PRESSURE/FFR STUDY N/A 11/16/2022   Procedure: INTRAVASCULAR PRESSURE WIRE/FFR STUDY;  Surgeon: Yvonne Kendall, MD;  Location: [facility] INVASIVE CV LAB;  Service: Cardiovascular;  Laterality: N/A;   FRACTURE SURGERY Left 2002   LEFT HEART CATH AND CORONARY ANGIOGRAPHY N/A 11/16/2022   Procedure: LEFT HEART CATH AND CORONARY ANGIOGRAPHY;  Surgeon: Yvonne Kendall, MD;  Location: [facility];  Service: Cardiovascular;  Laterality: N/A;   NECK SURGERY  1990   POLYPECTOMY  08/23/2022   Procedure: POLYPECTOMY;  Surgeon: Lanelle Bal, DO;  [facility]: [facility];  Service: Endoscopy;;   RADIAL ARTERY HARVEST Left 11/18/2022   Procedure: ATTEMPTED RADIAL ARTERY HARVEST;  Surgeon: Loreli Slot, MD;  Location: [facility] OR;  Service: Open Heart Surgery;  Laterality: Left;   TEE WITHOUT CARDIOVERSION N/A 11/18/2022   Procedure: TRANSESOPHAGEAL ECHOCARDIOGRAM;  Surgeon: Loreli Slot, MD;  Location: [facility] OR;  Service: Open Heart Surgery;  Laterality: N/A;     HPI  from the history and physical done on the day of admission:   Chief Complaint: Chest  Pain   HPI: Christian Davis is a 67 y.o. male with medical history significant for anxiety, tremors, CABG.  Patient presented to the ED with complaints of chest pain that started while he was sitting outside on his porch.  Describes central chest pain, that radiated to his jaw and arm with associated dizziness but no difficulty breathing.  Spouse reported chest pain lasted about 30 minutes.  On arrival to the ED chest pain was improving and he is currently chest pain-free.  Patient is unable to tell me if this chest pain was similar to recent chest pain requiring CABG. Patient has been going for physical therapy, and has had no difficulty breathing or chest pain with his activities. Recently diagnosed with cognitive decline, he  is on Aricept.  Patient's spouse helps him with his medications, he has been compliant with all his medications.   Recent CABG at [facility] 11/18/2022, by Dr. Dorris Fetch.  Has had postop follow-ups, last follow-up 01/18/2023 with cardiothoracic surgery, patient doing well.   ED Course: Temperature 96.8.  Heart rate 52-64.  Respiratory rate 13-20.  Blood pressure systolic 124-147.  O2 sats greater than 96% on room air. Troponin 9 > 10.  Chest x-ray shows bibasilar atelectasis and resolved left pleural effusion. EKG shows upright T waves in V3 - V6 , compared to prior EKG 4/5 with inverted T waves.   Review of Systems: As per HPI all other systems reviewed and negative.    Hospital Course:    Assessment and Plan: 1) atypical chest pain Chest pain in the setting of recent CABG. -Ruled out for ACS by troponins and EKG -. Last echo-TEE 4/4 shows EF of 60 to 65%.  -Cardiology consult appreciated -Recommended aspirin, Plavix, crestor, metoprolol -Sublingual nitro as needed -Cardiology recommends discharge home with outpatient follow-up  2)H/o CAD/S/P CABG x 3--in April 2024  CABG x 3 (LIMA to LAD, SVG to OM and SVG to Diagonal,.  -Medications as above #1  3)Generalized anxiety disorder C/n  Celexa  4)HTN- Losartan  and metoprolol as ordered  Discharge Condition: Stable , chest pain-free  Follow UP   Follow-up Information     Kirstie Peri, MD. Schedule an appointment as soon as possible for a visit.   Specialty: Internal Medicine Contact information: [address] [place] [address] [phone]         Sharlene Dory, NP. Schedule an appointment as soon as possible for a visit on 03/10/2023.   Specialty: Cardiology Contact information: [address] [place] [address] [phone]                  Consults obtained - cardiology  Diet and Activity recommendation:  As advised  Discharge Instructions  * Discharge Instructions     Call MD  for:  difficulty breathing, headache or visual disturbances   Complete by: As directed    Call MD for:  persistant dizziness or light-headedness   Complete by: As directed    Call MD for:  persistant nausea and vomiting   Complete by: As directed    Call MD for:  temperature >100.4   Complete by: As directed    Diet - low sodium heart healthy   Complete by: As directed    Discharge instructions   Complete by: As directed    1)Please STop Meloxicam (Mobic) while on aspirin and Plavix due to increased risk of bleeding and kidney complications 2)Avoid ibuprofen/Advil/Aleve/Motrin/Goody Powders/Naproxen/BC powders/Meloxicam/Diclofenac/Indomethacin and other Nonsteroidal anti-inflammatory medications  as these will make you more likely to bleed and can cause stomach ulcers, can also cause Kidney problems. 3) please continue aspirin and Plavix as previously prescribed to prevent heart attack 4)Please follow-up with cardiology PA Sharlene Dory on 03/10/2023 5) okay to take nitroglycerin tablets under the tongue as needed for chest discomfort   Increase activity slowly   Complete by: As directed          Discharge Medications     Allergies as of 02/09/2023       Reactions   Codeine Anxiety        Medication List     STOP taking these medications    meloxicam 7.5 MG tablet Commonly known as: MOBIC   predniSONE 10 MG (21) Tbpk tablet Commonly known as: STERAPRED UNI-PAK 21 TAB       TAKE these medications    acetaminophen 500 MG tablet Commonly known as: TYLENOL Take 2 tablets (1,000 mg total) by mouth every 6 (six) hours as needed.   aspirin EC 81 MG tablet Take 1 tablet (81 mg total) by mouth daily with breakfast. Swallow whole. What changed: when to take this   azelastine 0.1 % nasal spray Commonly known as: ASTELIN Place 2 sprays into both nostrils at bedtime. Use in each nostril as directed   BLUE-EMU MAXIMUM STRENGTH EX Apply 1 Application topically daily as  needed (pain).   cholecalciferol 25 MCG (1000 UNIT) tablet Commonly known as: VITAMIN D3 Take 1,000 Units by mouth daily.   citalopram 20 MG tablet Commonly known as: CELEXA Take 20 mg by mouth daily.   clopidogrel 75 MG tablet Commonly known as: PLAVIX Take 1 tablet (75 mg total) by mouth daily.   cyanocobalamin 1000 MCG tablet Commonly known as: VITAMIN B12 Take 1,000 mcg by mouth daily.   donepezil 10 MG disintegrating tablet Commonly known as: ARICEPT ODT Take 1 tablet (10 mg total) by mouth at bedtime.   furosemide 40 MG tablet Commonly known as: LASIX Take 1 tablet (40 mg total) by mouth daily.   levocetirizine 5 MG tablet Commonly known as: XYZAL Take 5 mg by mouth daily.   losartan 25 MG tablet Commonly known as: COZAAR Take 1 tablet (25 mg total) by mouth daily.   Metoprolol Tartrate 37.5 MG Tabs Take 1 tablet (37.5 mg total) by mouth 2 (two) times daily.   nitroGLYCERIN 0.4 MG SL tablet Commonly known as: Nitrostat Place 1 tablet (0.4 mg total) under the tongue every 5 (five) minutes as needed for chest pain.   potassium chloride SA 20 MEQ tablet Commonly known as: KLOR-CON M Take 1 tablet (20 mEq total) by mouth daily.   rosuvastatin 40 MG tablet Commonly known as: CRESTOR Take 1 tablet (40 mg total) by mouth daily.        Major procedures and Radiology Reports - PLEASE review detailed and final reports for all details, in brief -   DG Chest 2 View  Result Date: 02/08/2023 CLINICAL DATA:  Central chest pain, radiating to jaw and left arm, recent CABG EXAM: CHEST - 2 VIEW COMPARISON:  01/18/2023 FINDINGS: Unchanged cardiac and mediastinal contours, status post median sternotomy and CABG. Resolution of previously noted left pleural effusion. Bibasilar atelectasis. No pneumothorax. No acute osseous abnormality prior ACDF. IMPRESSION: Bibasilar atelectasis. Resolution of previously noted left pleural effusion. Electronically Signed   By: Wiliam Ke  M.D.   On: 02/08/2023 15:03   DG Chest 2 View  Result Date: 01/18/2023 CLINICAL DATA:  Post  CABG EXAM: CHEST - 2 VIEW COMPARISON:  Chest x-ray dated Dec 21, 2022 FINDINGS: Cardiac and mediastinal contours are unchanged. Status post median sternotomy and CABG. Small left pleural effusion is slightly decreased in size when compared with the prior exam. Bibasilar atelectasis. No evidence of pneumothorax. IMPRESSION: Small left pleural effusion is slightly decreased in size when compared with the prior exam. Electronically Signed   By: Allegra Lai M.D.   On: 01/18/2023 13:55     Today   Subjective    Romance Arquette today has no new complaints - No further chest pains -No dyspnea on exertion          Patient has been seen and examined prior to discharge   Objective   Blood pressure 136/71, pulse (!) 56, temperature 98.1 F (36.7 C), resp. rate 18, height 5\' 11"  (1.803 m), weight 111.2 kg, SpO2 97 %.   Intake/Output Summary (Last 24 hours) at 02/09/2023 1150 Last data filed at 02/09/2023 0948 Gross per 24 hour  Intake 600 ml  Output --  Net 600 ml    Exam Gen:- Awake Alert, no acute distress  HEENT:- [place].AT, No sclera icterus Neck-Supple Neck,No JVD,.  Lungs-  CTAB , good air movement bilaterally CV- S1, S2 normal, regular, sternotomy scar Abd-  +ve B.Sounds, Abd Soft, No tenderness,    Extremity/Skin:- No  edema,   good pulses, healed right lower extremity vein harvest area Psych-affect is appropriate, oriented x3 Neuro-no new focal deficits, no tremors    Data Review   CBC w Diff:  Lab Results  Component Value Date   WBC 5.6 02/09/2023   HGB 12.4 (L) 02/09/2023   HGB 16.3 01/28/2020   HCT 38.2 (L) 02/09/2023   HCT 49.2 01/28/2020   PLT 143 (L) 02/09/2023   PLT 212 01/28/2020    CMP:  Lab Results  Component Value Date   NA 136 02/09/2023   NA 134 01/28/2020   K 4.1 02/09/2023   CL 102 02/09/2023   CO2 25 02/09/2023   BUN 12 02/09/2023   BUN 11 01/28/2020    CREATININE 0.69 02/09/2023   PROT 6.9 01/28/2020   ALBUMIN 4.3 01/28/2020   BILITOT 0.6 01/28/2020   ALKPHOS 73 01/28/2020   AST 20 01/28/2020   ALT 17 01/28/2020  .  Total Discharge time is about 33 minutes  Shon Hale M.D on 02/09/2023 at 11:50 AM  Go to [email or web address] -  for contact info  [facility] - Office  [phone]

## 2023-02-11 ENCOUNTER — Encounter (HOSPITAL_COMMUNITY): Payer: Medicare Other

## 2023-02-14 ENCOUNTER — Encounter (HOSPITAL_COMMUNITY)
Admission: RE | Admit: 2023-02-14 | Discharge: 2023-02-14 | Disposition: A | Discharge status: Home / Self Care | Payer: Medicare Other | Source: Ambulatory Visit | Attending: Internal Medicine | Admitting: Internal Medicine

## 2023-02-14 VITALS — Wt 243.8 lb

## 2023-02-14 DIAGNOSIS — I214 Non-ST elevation (NSTEMI) myocardial infarction: Secondary | ICD-10-CM | POA: Insufficient documentation

## 2023-02-14 DIAGNOSIS — Z951 Presence of aortocoronary bypass graft: Secondary | ICD-10-CM | POA: Insufficient documentation

## 2023-02-14 NOTE — Progress Notes (Signed)
Daily Session Note  Patient Details  Name: Christian Davis MRN: [id number] Date of Birth: May 13, 1956 Referring Provider:   Flowsheet Row CARDIAC REHAB PHASE II ORIENTATION from 01/20/2023 in [facility] CARDIAC REHABILITATION  Referring Provider Dr. Dorris Fetch       Encounter Date: 02/14/2023  Check In:  Session Check In - 02/14/23 0930       Check-In   Supervising physician immediately available to respond to emergencies [facility] MD immediately available    Physician(s) Dr. Dominga Ferry    Location [facility]    Staff Present [name], BS, Exercise Physiologist;Georgean Spainhower Laural Benes, RN, BSN    Virtual Visit No    Medication changes reported     Yes    Comments Patient hospitalzied. Discontinue Mobic and Prednisone; Add SL NTG prn and change ASA to 81 mg daily.    Fall or balance concerns reported    Yes    Comments Patient says he loses his balance at times.    Tobacco Cessation No Change    Warm-up and Cool-down Performed as group-led instruction    Resistance Training Performed Yes    VAD Patient? No    PAD/SET Patient? No      Pain Assessment   Currently in Pain? No/denies    Pain Score 0-No pain    Multiple Pain Sites No             Capillary Blood Glucose: No results found for this or any previous visit (from the past 24 hour(s)).    Social History   Tobacco Use  Smoking Status Never   Passive exposure: Never  Smokeless Tobacco Never    Goals Met:  Independence with exercise equipment Exercise tolerated well No report of concerns or symptoms today Strength training completed today  Goals Unmet:  Not Applicable  Comments: Check out 1030.   Dr. Dina Rich is Medical Director for [facility] Cardiac Rehab

## 2023-02-15 NOTE — Progress Notes (Signed)
[address]       [name] [address]             [phone] HPI: This is a 67 year old man who is s/p CABG x 3 (LIMA to LAD, SVG to OM and SVG to Diagonal, left radial artery was too small to use) by Dr. Dorris Fetch on 11/18/22. Marland Kitchen He has been progressing well but had a persistent left pleural effusion. He was last seen by my colleague on 01/18/2023. He was given 2 weeks of Lasix and started on a Prednisone taper. He presents today for further follow up of left pleural effusion. Of note, he presented to [facility] ED on 06/25 with sharp chest pain (unlike chest pain prior to surgery). He was ruled out for ACS (by Troponin and EKG). He occasionally has a which I told him is likely from sternotomy.  Current Outpatient Medications  Medication Sig Dispense Refill   acetaminophen (TYLENOL) 500 MG tablet Take 2 tablets (1,000 mg total) by mouth every 6 (six) hours as needed. 30 tablet 0   aspirin EC 81 MG tablet Take 1 tablet (81 mg total) by mouth daily with breakfast. Swallow whole. 30 tablet 12   azelastine (ASTELIN) 0.1 % nasal spray Place 2 sprays into both nostrils at bedtime. Use in each nostril as directed     cholecalciferol (VITAMIN D3) 25 MCG (1000 UNIT) tablet Take 1,000 Units by mouth daily.     citalopram (CELEXA) 20 MG tablet Take 20 mg by mouth daily.     clopidogrel (PLAVIX) 75 MG tablet Take 1 tablet (75 mg total) by mouth daily. 60 tablet 1   cyanocobalamin (VITAMIN B12) 1000 MCG tablet Take 1,000 mcg by mouth daily.     donepezil (ARICEPT ODT) 10 MG disintegrating tablet Take 1 tablet (10 mg total) by mouth at bedtime. 30 tablet 3   furosemide (LASIX) 40 MG tablet Take 1 tablet (40 mg total) by mouth daily. 14 tablet 0   levocetirizine (XYZAL) 5 MG tablet Take 5 mg by mouth daily.     losartan (COZAAR) 25 MG tablet Take 1 tablet (25 mg total) by mouth daily. 30 tablet 3   Menthol, Topical Analgesic, (BLUE-EMU MAXIMUM STRENGTH EX) Apply 1 Application  topically daily as needed (pain).     Metoprolol Tartrate 37.5 MG TABS Take 1 tablet (37.5 mg total) by mouth 2 (two) times daily. 90 tablet 1   nitroGLYCERIN (NITROSTAT) 0.4 MG SL tablet Place 1 tablet (0.4 mg total) under the tongue every 5 (five) minutes as needed for chest pain. 30 tablet 3   potassium chloride SA (KLOR-CON M) 20 MEQ tablet Take 1 tablet (20 mEq total) by mouth daily. 14 tablet 0   rosuvastatin (CRESTOR) 40 MG tablet Take 1 tablet (40 mg total) by mouth daily. 30 tablet 3   Vital Signs: Vitals:   02/21/23 1313  BP: 107/62  Pulse: (!) 58  Resp: 20  SpO2: 95%      Physical Exam: CV-RRR Pulmonary-Clear to auscultation bilaterally Extremities-Trace LE edema on right. Chronic venous stasis changes LEs.  Wounds-Sternal and RLE wounds are clean, dry, well healed, and no sign of infection. There is no sternal instability  Diagnostic Tests:  Narrative & Impression  CLINICAL DATA:  Status post CABG   EXAM: CHEST - 2 VIEW   COMPARISON:  Previous studies including the examination of 02/08/2023   FINDINGS: Transverse diameter of heart is increased. There is a coronary bypass surgery. There are  no signs of pulmonary edema or new focal infiltrates. There is no pleural effusion or pneumothorax. There is surgical fusion in cervical spine.   IMPRESSION: There are no signs of pulmonary edema or new focal infiltrates.     Electronically Signed   By: Ernie Avena M.D.   On: 02/21/2023 13:08      Impression and Plan: Regarding pain left of sternum (rib area), likely from surgery (sternotomy). Hopefully, will improve with more time. There are no broken wires on CXR and no sternal instability on exam. He is participating in cardiac re[facility] at [facility]. Per wife, he has an appointment to see Dr. Lenord Carbo at the end of the month. He was instructed to call for any problems;otherwise, he will be seen by [facility] PRN.      Ardelle Balls, PA-C [facility] [phone]

## 2023-02-16 ENCOUNTER — Encounter (HOSPITAL_COMMUNITY): Payer: Medicare Other

## 2023-02-18 ENCOUNTER — Encounter (HOSPITAL_COMMUNITY)
Admission: RE | Admit: 2023-02-18 | Discharge: 2023-02-18 | Disposition: A | Discharge status: Home / Self Care | Payer: Medicare Other | Source: Ambulatory Visit | Attending: Internal Medicine | Admitting: Internal Medicine

## 2023-02-18 DIAGNOSIS — Z951 Presence of aortocoronary bypass graft: Secondary | ICD-10-CM

## 2023-02-18 DIAGNOSIS — I214 Non-ST elevation (NSTEMI) myocardial infarction: Secondary | ICD-10-CM

## 2023-02-18 NOTE — Progress Notes (Signed)
Daily Session Note  Patient Details  Name: Christian Davis MRN: [id number] Date of Birth: 07/01/1956 Referring Provider:   Flowsheet Row CARDIAC REHAB PHASE II ORIENTATION from 01/20/2023 in [facility] CARDIAC REHABILITATION  Referring Provider Dr. Dorris Fetch       Encounter Date: 02/18/2023  Check In:  Session Check In - 02/18/23 0925       Check-In   Supervising physician immediately available to respond to emergencies [facility] MD immediately available    Physician(s) Dr. Diona Browner    Location [facility]    Staff Present Rodena Medin, RN, BSN;Jessica Hawkins, MA, RCEP, CCRP, CCET    Virtual Visit No    Medication changes reported     No    Fall or balance concerns reported    Yes    Comments Patient says he loses his balance at times.    Warm-up and Cool-down Performed as group-led Writer Performed Yes    VAD Patient? No    PAD/SET Patient? No      Pain Assessment   Currently in Pain? No/denies    Pain Score 0-No pain    Multiple Pain Sites No             Capillary Blood Glucose: No results found for this or any previous visit (from the past 24 hour(s)).    Social History   Tobacco Use  Smoking Status Never   Passive exposure: Never  Smokeless Tobacco Never    Goals Met:  Independence with exercise equipment Exercise tolerated well No report of concerns or symptoms today Strength training completed today  Goals Unmet:  Not Applicable  Comments: Check out 10:30   Dr. Dina Rich is Medical Director for [facility] Cardiac Rehab

## 2023-02-21 ENCOUNTER — Ambulatory Visit (INDEPENDENT_AMBULATORY_CARE_PROVIDER_SITE_OTHER): Payer: Medicare Other | Admitting: Physician Assistant

## 2023-02-21 ENCOUNTER — Encounter (HOSPITAL_COMMUNITY): Payer: Medicare Other

## 2023-02-21 ENCOUNTER — Encounter: Payer: Self-pay | Admitting: Physician Assistant

## 2023-02-21 ENCOUNTER — Ambulatory Visit
Admission: RE | Admit: 2023-02-21 | Discharge: 2023-02-21 | Disposition: A | Discharge status: Home / Self Care | Payer: Medicare Other | Source: Ambulatory Visit | Attending: Thoracic Surgery (Cardiothoracic Vascular Surgery) | Admitting: Thoracic Surgery (Cardiothoracic Vascular Surgery)

## 2023-02-21 VITALS — BP 107/62 | HR 58 | Resp 20 | Ht 71.0 in | Wt 246.0 lb

## 2023-02-21 DIAGNOSIS — Z951 Presence of aortocoronary bypass graft: Secondary | ICD-10-CM

## 2023-02-23 ENCOUNTER — Encounter (HOSPITAL_COMMUNITY)
Admission: RE | Admit: 2023-02-23 | Discharge: 2023-02-23 | Disposition: A | Discharge status: Home / Self Care | Payer: Medicare Other | Source: Ambulatory Visit | Attending: Internal Medicine | Admitting: Internal Medicine

## 2023-02-23 DIAGNOSIS — I25119 Atherosclerotic heart disease of native coronary artery with unspecified angina pectoris: Secondary | ICD-10-CM | POA: Diagnosis not present

## 2023-02-23 DIAGNOSIS — Z299 Encounter for prophylactic measures, unspecified: Secondary | ICD-10-CM | POA: Diagnosis not present

## 2023-02-23 DIAGNOSIS — Z951 Presence of aortocoronary bypass graft: Secondary | ICD-10-CM

## 2023-02-23 DIAGNOSIS — J61 Pneumoconiosis due to asbestos and other mineral fibers: Secondary | ICD-10-CM | POA: Diagnosis not present

## 2023-02-23 DIAGNOSIS — I214 Non-ST elevation (NSTEMI) myocardial infarction: Secondary | ICD-10-CM

## 2023-02-23 NOTE — Progress Notes (Signed)
Daily Session Note  Patient Details  Name: Christian Davis MRN: [id number] Date of Birth: 05-Oct-1955 Referring Provider:   Flowsheet Row CARDIAC REHAB PHASE II ORIENTATION from 01/20/2023 in [facility] CARDIAC REHABILITATION  Referring Provider Dr. Dorris Fetch       Encounter Date: 02/23/2023  Check In:  Session Check In - 02/23/23 0930       Check-In   Supervising physician immediately available to respond to emergencies [facility] MD immediately available    Physician(s) Dr. Diona Browner    Location [facility]    Staff Present Rodena Medin, RN, BSN;Jessica Hawkins, MA, RCEP, CCRP, CCET    Virtual Visit No    Medication changes reported     No    Fall or balance concerns reported    Yes    Comments Patient says he loses his balance at times.    Warm-up and Cool-down Performed as group-led Writer Performed Yes    PAD/SET Patient? No      Pain Assessment   Currently in Pain? No/denies    Pain Score 0-No pain    Multiple Pain Sites No             Capillary Blood Glucose: No results found for this or any previous visit (from the past 24 hour(s)).    Social History   Tobacco Use  Smoking Status Never   Passive exposure: Never  Smokeless Tobacco Never    Goals Met:  Independence with exercise equipment Exercise tolerated well No report of concerns or symptoms today Strength training completed today  Goals Unmet:  Not Applicable  Comments: Pt able to follow exercise prescription today without complaint.  Will continue to monitor for progression.    Dr. Dina Rich is Medical Director for [facility] Cardiac Rehab

## 2023-02-25 ENCOUNTER — Telehealth: Payer: Self-pay | Admitting: Student

## 2023-02-25 ENCOUNTER — Encounter (HOSPITAL_COMMUNITY)
Admission: RE | Admit: 2023-02-25 | Discharge: 2023-02-25 | Disposition: A | Discharge status: Home / Self Care | Payer: Medicare Other | Source: Ambulatory Visit | Attending: Internal Medicine | Admitting: Internal Medicine

## 2023-02-25 DIAGNOSIS — I214 Non-ST elevation (NSTEMI) myocardial infarction: Secondary | ICD-10-CM

## 2023-02-25 DIAGNOSIS — Z951 Presence of aortocoronary bypass graft: Secondary | ICD-10-CM | POA: Diagnosis not present

## 2023-02-25 NOTE — Telephone Encounter (Signed)
Left message to return call 

## 2023-02-25 NOTE — Telephone Encounter (Signed)
   I was notified by cardiac rehab that the patient has gained 6 pounds in 1 week and they wanted guidance on adjusting his diuretic. He is listed as taking Lasix 40 mg daily. If he has been compliant with this, would recommend titrating to 40 mg twice daily for the next 3 days and then resuming his baseline dose (can take the extra dose mid-afternoon to help reduce nocturia). He should take an extra tablet if needed for weight gain greater than 3 pounds overnight or 5 pounds in 1 week. He does have previously scheduled follow-up on 03/10/2023 in our [place] office.   Signed, Ellsworth Lennox, PA-C 02/25/2023, 11:21 AM

## 2023-02-25 NOTE — Progress Notes (Addendum)
Daily Session Note  Patient Details  Name: Christian Davis MRN: [id number] Date of Birth: [birth year] Referring Provider:   Flowsheet Row CARDIAC REHAB PHASE II ORIENTATION from 01/20/2023 in [facility] CARDIAC REHABILITATION  Referring Provider Dr. Dorris Davis       Encounter Date: 02/25/2023  Check In:  Session Check In - 02/25/23 0932       Check-In   Supervising physician immediately available to respond to emergencies [facility] MD immediately available    Physician(s) Dr. Dominga Davis    Location [facility]    Staff Present Christian Medin, RN, BSN;Christian Montfort, MA, RCEP, CCRP, CCET    Virtual Visit No    Medication changes reported     No    Fall or balance concerns reported    No    Warm-up and Cool-down Performed on first and last piece of equipment    Resistance Training Performed Yes    VAD Patient? No    PAD/SET Patient? No      Pain Assessment   Currently in Pain? No/denies             Capillary Blood Glucose: No results found for this or any previous visit (from the past 24 hour(s)).    Social History   Tobacco Use  Smoking Status Never   Passive exposure: Never  Smokeless Tobacco Never    Goals Met:  Independence with exercise equipment Exercise tolerated well No report of concerns or symptoms today Strength training completed today  Goals Unmet:  Not Applicable  Comments: Pt able to follow exercise prescription today without complaint.  Will continue to monitor for progression. Christian Davis's weight is up 6lb over last week.  We have reached out to his doctor about this.  He does take Lasix.  He did not report any symptoms.    Dr. Dina Davis is Medical Director for [facility] Cardiac Rehab

## 2023-02-28 ENCOUNTER — Encounter (HOSPITAL_COMMUNITY)
Admission: RE | Admit: 2023-02-28 | Discharge: 2023-02-28 | Disposition: A | Discharge status: Home / Self Care | Payer: Medicare Other | Source: Ambulatory Visit | Attending: Internal Medicine | Admitting: Internal Medicine

## 2023-02-28 DIAGNOSIS — I214 Non-ST elevation (NSTEMI) myocardial infarction: Secondary | ICD-10-CM | POA: Diagnosis not present

## 2023-02-28 DIAGNOSIS — Z951 Presence of aortocoronary bypass graft: Secondary | ICD-10-CM

## 2023-02-28 NOTE — Telephone Encounter (Signed)
Called pt. No answer. Left msg to call back.  

## 2023-02-28 NOTE — Progress Notes (Signed)
Daily Session Note  Patient Details  Name: Christian Davis MRN: [id number] Date of Birth: 05/25/1956 Referring Provider:   Flowsheet Row CARDIAC REHAB PHASE II ORIENTATION from 01/20/2023 in [facility] CARDIAC REHABILITATION  Referring Provider Dr. Dorris Fetch       Encounter Date: 02/28/2023  Check In:  Session Check In - 02/28/23 0915       Check-In   Supervising physician immediately available to respond to emergencies [facility] MD immediately available    Physician(s) Dr. Jenene Slicker    Location [facility]    Staff Present [name], BS, Exercise Physiologist;Jessica Juanetta Gosling, MA, RCEP, CCRP, CCET;Daphyne Daphine Deutscher, RN, BSN    Virtual Visit No    Medication changes reported     No    Fall or balance concerns reported    No    Tobacco Cessation No Change    Warm-up and Cool-down Performed on first and last piece of equipment    Resistance Training Performed Yes    VAD Patient? No    PAD/SET Patient? No      Pain Assessment   Currently in Pain? No/denies    Pain Score 0-No pain    Multiple Pain Sites No             Capillary Blood Glucose: No results found for this or any previous visit (from the past 24 hour(s)).    Social History   Tobacco Use  Smoking Status Never   Passive exposure: Never  Smokeless Tobacco Never    Goals Met:  Independence with exercise equipment Exercise tolerated well No report of concerns or symptoms today Strength training completed today  Goals Unmet:  Not Applicable  Comments: Pt able to follow exercise prescription today without complaint.  Will continue to monitor for progression.     Dr. Dina Rich is Medical Director for [facility] Card[facility]

## 2023-03-01 NOTE — Telephone Encounter (Signed)
Patient is returning call. Please advise? 

## 2023-03-01 NOTE — Telephone Encounter (Signed)
Spoke to pt's wife- ok per pt- who stated that pt does not currently take Lasix 40 mg tablet once daily. Pt only take Lasix when he is told to by provider.   Please advise.

## 2023-03-02 ENCOUNTER — Encounter (HOSPITAL_COMMUNITY)
Admission: RE | Admit: 2023-03-02 | Discharge: 2023-03-02 | Disposition: A | Discharge status: Home / Self Care | Payer: Medicare Other | Source: Ambulatory Visit | Attending: Internal Medicine | Admitting: Internal Medicine

## 2023-03-02 ENCOUNTER — Telehealth: Payer: Self-pay | Admitting: Student

## 2023-03-02 ENCOUNTER — Encounter (HOSPITAL_COMMUNITY): Payer: Self-pay | Admitting: *Deleted

## 2023-03-02 DIAGNOSIS — I214 Non-ST elevation (NSTEMI) myocardial infarction: Secondary | ICD-10-CM

## 2023-03-02 DIAGNOSIS — Z951 Presence of aortocoronary bypass graft: Secondary | ICD-10-CM | POA: Diagnosis not present

## 2023-03-02 DIAGNOSIS — H524 Presbyopia: Secondary | ICD-10-CM | POA: Diagnosis not present

## 2023-03-02 DIAGNOSIS — H353131 Nonexudative age-related macular degeneration, bilateral, early dry stage: Secondary | ICD-10-CM | POA: Diagnosis not present

## 2023-03-02 NOTE — Telephone Encounter (Signed)
[name] N35 minutes ago (9:06 AM)   [facility] Patient is returning CMA's call. He states he is going into rehab, so he would like a callback after 10:15 am. Please advise.

## 2023-03-02 NOTE — Telephone Encounter (Signed)
 Patient notified and verbalized understanding. Patient had no further questions or concerns at this time.

## 2023-03-02 NOTE — Telephone Encounter (Signed)
Left a message for patient to call office back regarding recommendations made by provider.

## 2023-03-02 NOTE — Telephone Encounter (Signed)
Noted. Will call pt back after 10:15 am. Please see other phone note.

## 2023-03-02 NOTE — Telephone Encounter (Signed)
Patient is returning CMA's call. He states he is going into rehab, so he would like a callback after 10:15 am. Please advise.

## 2023-03-02 NOTE — Progress Notes (Signed)
Daily Session Note  Patient Details  Name: Christian Davis MRN: [id number] Date of Birth: [birth year] Referring Provider:   Flowsheet Row CARDIAC REHAB PHASE II ORIENTATION from 01/20/2023 in [facility] CARDIAC REHABILITATION  Referring Provider Dr. Dorris Fetch       Encounter Date: 03/02/2023  Check In:  Session Check In - 03/02/23 0930       Check-In   Supervising physician immediately available to respond to emergencies [facility] MD immediately available    Physician(s) Dr. Jenene Slicker    Location [facility]    Staff Present Fabio Pierce, MA, RCEP, CCRP, Harolyn Rutherford, RN, BSN    Virtual Visit No    Medication changes reported     No    Fall or balance concerns reported    No    Tobacco Cessation No Change    Warm-up and Cool-down Performed on first and last piece of equipment    Resistance Training Performed Yes    VAD Patient? No      Pain Assessment   Currently in Pain? No/denies    Pain Score 0-No pain             Capillary Blood Glucose: No results found for this or any previous visit (from the past 24 hour(s)).    Social History   Tobacco Use  Smoking Status Never   Passive exposure: Never  Smokeless Tobacco Never    Goals Met:  Independence with exercise equipment Exercise tolerated well No report of concerns or symptoms today Strength training completed today  Goals Unmet:  Not Applicable  Comments: Pt able to follow exercise prescription today without complaint.  Will continue to monitor for progression.    Dr. Dina Rich is Medical Director for [facility]

## 2023-03-02 NOTE — Progress Notes (Signed)
Cardiac Individual Treatment Plan  Patient Details  Name: Christian Davis MRN: [id number] Date of Birth: November 13, 1955 Referring Provider:   Flowsheet Row CARDIAC REHAB PHASE II ORIENTATION from 01/20/2023 in [facility] CARDIAC REHABILITATION  Referring Provider Dr. Dorris Fetch       Initial Encounter Date:  Flowsheet Row CARDIAC REHAB PHASE II ORIENTATION from 01/20/2023 in [facility] CARDIAC REHABILITATION  Date 01/20/23       Visit Diagnosis: NSTEMI (non-ST elevated myocardial infarction) (HCC)  S/P CABG x 3  Patient's Home Medications on Admission:  Current Outpatient Medications:    acetaminophen (TYLENOL) 500 MG tablet, Take 2 tablets (1,000 mg total) by mouth every 6 (six) hours as needed., Disp: 30 tablet, Rfl: 0   aspirin EC 81 MG tablet, Take 1 tablet (81 mg total) by mouth daily with breakfast. Swallow whole., Disp: 30 tablet, Rfl: 12   azelastine (ASTELIN) 0.1 % nasal spray, Place 2 sprays into both nostrils at bedtime. Use in each nostril as directed, Disp: , Rfl:    cholecalciferol (VITAMIN D3) 25 MCG (1000 UNIT) tablet, Take 1,000 Units by mouth daily., Disp: , Rfl:    citalopram (CELEXA) 20 MG tablet, Take 20 mg by mouth daily., Disp: , Rfl:    clopidogrel (PLAVIX) 75 MG tablet, Take 1 tablet (75 mg total) by mouth daily., Disp: 60 tablet, Rfl: 1   cyanocobalamin (VITAMIN B12) 1000 MCG tablet, Take 1,000 mcg by mouth daily., Disp: , Rfl:    donepezil (ARICEPT ODT) 10 MG disintegrating tablet, Take 1 tablet (10 mg total) by mouth at bedtime., Disp: 30 tablet, Rfl: 3   furosemide (LASIX) 40 MG tablet, Take 1 tablet (40 mg total) by mouth daily., Disp: 14 tablet, Rfl: 0   levocetirizine (XYZAL) 5 MG tablet, Take 5 mg by mouth daily., Disp: , Rfl:    losartan (COZAAR) 25 MG tablet, Take 1 tablet (25 mg total) by mouth daily., Disp: 30 tablet, Rfl: 3   Menthol, Topical Analgesic, (BLUE-EMU MAXIMUM STRENGTH EX), Apply 1 Application topically daily as needed (pain)., Disp: , Rfl:     Metoprolol Tartrate 37.5 MG TABS, Take 1 tablet (37.5 mg total) by mouth 2 (two) times daily., Disp: 90 tablet, Rfl: 1   nitroGLYCERIN (NITROSTAT) 0.4 MG SL tablet, Place 1 tablet (0.4 mg total) under the tongue every 5 (five) minutes as needed for chest pain., Disp: 30 tablet, Rfl: 3   potassium chloride SA (KLOR-CON M) 20 MEQ tablet, Take 1 tablet (20 mEq total) by mouth daily., Disp: 14 tablet, Rfl: 0   rosuvastatin (CRESTOR) 40 MG tablet, Take 1 tablet (40 mg total) by mouth daily., Disp: 30 tablet, Rfl: 3  Past Medical History: Past Medical History:  Diagnosis Date   Arthritis    Cancer (HCC)    Coronary artery calcification seen on CT scan    Depression    GERD (gastroesophageal reflux disease)    H/O asbestos exposure    History of kidney stones     Tobacco Use: Social History   Tobacco Use  Smoking Status Never   Passive exposure: Never  Smokeless Tobacco Never    Labs: Review Flowsheet  More data exists      Latest Ref Rng & Units 01/28/2020 11/15/2022 11/16/2022 11/18/2022 02/09/2023  Labs for ITP Cardiac and Pulmonary Rehab  Cholestrol 0 - 200 mg/dL 478  - 295  - 621   LDL (calc) 0 - 99 mg/dL 308  - 657  - 56   HDL-C >40 mg/dL 53  -  62  - 53   Trlycerides <150 mg/dL 409  - 71  - 83   Hemoglobin A1c 4.8 - 5.6 % - 5.9  5.9  - -  PH, Arterial 7.35 - 7.45 - - - 7.299  7.286  7.391  7.399  7.371  7.449  7.445  7.396  7.330  -  PCO2 arterial 32 - 48 mmHg - - - 41.3  39.8  31.9  34.7  39.4  31.0  31.3  34.8  47.9  -  Bicarbonate 20.0 - 28.0 mmol/L - - - 20.0  18.8  19.8  21.4  22.8  21.5  21.5  23.1  21.4  25.3  -  TCO2 22 - 32 mmol/L - - - 21  20  21  23  22  24  25  22  24  22  24  22  24  27  27   -  Acid-base deficit 0.0 - 2.0 mmol/L - - - 6.0  7.0  5.0  3.0  2.0  2.0  2.0  2.0  3.0  1.0  -  O2 Saturation % - - - 97  99  96  100  100  100  100  79  100  100  -    Details       Multiple values from one day are sorted in reverse-chronological order          Capillary Blood Glucose: Lab Results  Component Value Date   GLUCAP 90 11/20/2022   GLUCAP 97 11/20/2022   GLUCAP 94 11/20/2022   GLUCAP 117 (H) 11/19/2022   GLUCAP 123 (H) 11/19/2022     Exercise Target Goals: Exercise Program Goal: Individual exercise prescription set using results from initial 6 min walk test and THRR while considering  patient's activity barriers and safety.   Exercise Prescription Goal: Starting with aerobic activity 30 plus minutes a day, 3 days per week for initial exercise prescription. Provide home exercise prescription and guidelines that participant acknowledges understanding prior to discharge.  Activity Barriers & Risk Stratification:  Activity Barriers & Cardiac Risk Stratification - 01/20/23 0900       Activity Barriers & Cardiac Risk Stratification   Activity Barriers Neck/Spine Problems    Cardiac Risk Stratification High             6 Minute Walk:  6 Minute Walk     Row Name 01/20/23 1011         6 Minute Walk   Phase Initial     Distance 1000 feet     Walk Time 6 minutes     # of Rest Breaks 0     MPH 1.89     METS 1.8     RPE 11     VO2 Peak 6.3     Symptoms No     Resting HR 56 bpm     Resting BP 112/72     Resting Oxygen Saturation  96 %     Exercise Oxygen Saturation  during 6 min walk 92 %     Max Ex. HR 68 bpm     Max Ex. BP 120/70     2 Minute Post BP 116/70              Oxygen Initial Assessment:   Oxygen Re-Evaluation:   Oxygen Discharge (Final Oxygen Re-Evaluation):   Initial Exercise Prescription:  Initial Exercise Prescription - 01/20/23 1000       Date of Initial  Exercise RX and Referring Provider   Date 01/20/23    Referring Provider Dr. Dorris Fetch    Expected Discharge Date 04/13/23      Treadmill   MPH 1    Grade 0    Minutes 17      NuStep   Level 1    SPM 50    Minutes 22      Prescription Details   Frequency (times per week) 3    Duration Progress to 30 minutes of  continuous aerobic without signs/symptoms of physical distress      Intensity   THRR 40-80% of Max Heartrate 62-124    Ratings of Perceived Exertion 11-13      Resistance Training   Training Prescription Yes    Weight 3    Reps 10-15             Perform Capillary Blood Glucose checks as needed.  Exercise Prescription Changes:   Exercise Prescription Changes     Row Name 01/31/23 1200 02/14/23 1200 02/28/23 1300         Response to Exercise   Blood Pressure (Admit) 130/60 100/50 104/62     Blood Pressure (Exercise) 124/62 102/60 120/62     Blood Pressure (Exit) 116/62 100/56 120/60     Heart Rate (Admit) 59 bpm 63 bpm 80 bpm     Heart Rate (Exercise) 84 bpm 77 bpm 86 bpm     Heart Rate (Exit) 63 bpm 68 bpm 82 bpm     Rating of Perceived Exertion (Exercise) 12 11 12      Duration Continue with 30 min of aerobic exercise without signs/symptoms of physical distress. Continue with 30 min of aerobic exercise without signs/symptoms of physical distress. Continue with 30 min of aerobic exercise without signs/symptoms of physical distress.     Intensity THRR unchanged THRR unchanged THRR unchanged       Progression   Progression Continue to progress workloads to maintain intensity without signs/symptoms of physical distress. Continue to progress workloads to maintain intensity without signs/symptoms of physical distress. Continue to progress workloads to maintain intensity without signs/symptoms of physical distress.       Resistance Training   Training Prescription Yes Yes Yes     Weight 4 4 4      Reps 10-15 10-15 10-15     Time 10 Minutes 10 Minutes --       Treadmill   MPH 1.5 1.8 1     Grade 0 0 0     Minutes 17 17 15      METs 2.15 2.38 1.92       NuStep   Level 2 1 1      SPM 63 77 72     Minutes 22 22 15      METs 1.79 1.84 1.8       Oxygen   Maintain Oxygen Saturation -- -- 88% or higher              Exercise Comments:   Exercise Goals and Review:    Exercise Goals     Row Name 01/20/23 1015 01/31/23 1215 02/28/23 1314         Exercise Goals   Increase Physical Activity Yes Yes Yes     Intervention Provide advice, education, support and counseling about physical activity/exercise needs.;Develop an individualized exercise prescription for aerobic and resistive training based on initial evaluation findings, risk stratification, comorbidities and participant's personal goals. Provide advice, education, support and counseling about physical activity/exercise needs.;Develop  an individualized exercise prescription for aerobic and resistive training based on initial evaluation findings, risk stratification, comorbidities and participant's personal goals. Provide advice, education, support and counseling about physical activity/exercise needs.;Develop an individualized exercise prescription for aerobic and resistive training based on initial evaluation findings, risk stratification, comorbidities and participant's personal goals.     Expected Outcomes Short Term: Attend rehab on a regular basis to increase amount of physical activity.;Long Term: Add in home exercise to make exercise part of routine and to increase amount of physical activity.;Long Term: Exercising regularly at least 3-5 days a week. Short Term: Attend rehab on a regular basis to increase amount of physical activity.;Long Term: Add in home exercise to make exercise part of routine and to increase amount of physical activity.;Long Term: Exercising regularly at least 3-5 days a week. Short Term: Attend rehab on a regular basis to increase amount of physical activity.;Long Term: Add in home exercise to make exercise part of routine and to increase amount of physical activity.;Long Term: Exercising regularly at least 3-5 days a week.     Increase Strength and Stamina Yes Yes Yes     Intervention Provide advice, education, support and counseling about physical activity/exercise needs.;Develop an  individualized exercise prescription for aerobic and resistive training based on initial evaluation findings, risk stratification, comorbidities and participant's personal goals. Provide advice, education, support and counseling about physical activity/exercise needs.;Develop an individualized exercise prescription for aerobic and resistive training based on initial evaluation findings, risk stratification, comorbidities and participant's personal goals. Provide advice, education, support and counseling about physical activity/exercise needs.;Develop an individualized exercise prescription for aerobic and resistive training based on initial evaluation findings, risk stratification, comorbidities and participant's personal goals.     Expected Outcomes Short Term: Increase workloads from initial exercise prescription for resistance, speed, and METs.;Short Term: Perform resistance training exercises routinely during rehab and add in resistance training at home;Long Term: Improve cardiorespiratory fitness, muscular endurance and strength as measured by increased METs and functional capacity ( ) Short Term: Increase workloads from initial exercise prescription for resistance, speed, and METs.;Short Term: Perform resistance training exercises routinely during rehab and add in resistance training at home;Long Term: Improve cardiorespiratory fitness, muscular endurance and strength as measured by increased METs and functional capacity ( ) Short Term: Increase workloads from initial exercise prescription for resistance, speed, and METs.;Short Term: Perform resistance training exercises routinely during rehab and add in resistance training at home;Long Term: Improve cardiorespiratory fitness, muscular endurance and strength as measured by increased METs and functional capacity ( )     Able to understand and use rate of perceived exertion (RPE) scale Yes Yes Yes     Intervention Provide education and explanation on  how to use RPE scale Provide education and explanation on how to use RPE scale Provide education and explanation on how to use RPE scale     Expected Outcomes Short Term: Able to use RPE daily in rehab to express subjective intensity level;Long Term:  Able to use RPE to guide intensity level when exercising independently Short Term: Able to use RPE daily in rehab to express subjective intensity level;Long Term:  Able to use RPE to guide intensity level when exercising independently Short Term: Able to use RPE daily in rehab to express subjective intensity level;Long Term:  Able to use RPE to guide intensity level when exercising independently     Knowledge and understanding of Target Heart Rate Range (THRR) Yes Yes Yes     Intervention Provide education and explanation of THRR  including how the numbers were predicted and where they are located for reference Provide education and explanation of THRR including how the numbers were predicted and where they are located for reference Provide education and explanation of THRR including how the numbers were predicted and where they are located for reference     Expected Outcomes Short Term: Able to state/look up THRR;Long Term: Able to use THRR to govern intensity when exercising independently;Short Term: Able to use daily as guideline for intensity in rehab Short Term: Able to state/look up THRR;Long Term: Able to use THRR to govern intensity when exercising independently;Short Term: Able to use daily as guideline for intensity in rehab Short Term: Able to state/look up THRR;Long Term: Able to use THRR to govern intensity when exercising independently;Short Term: Able to use daily as guideline for intensity in rehab     Able to check pulse independently Yes Yes Yes     Intervention Provide education and demonstration on how to check pulse in carotid and radial arteries.;Review the importance of being able to check your own pulse for safety during independent  exercise Provide education and demonstration on how to check pulse in carotid and radial arteries.;Review the importance of being able to check your own pulse for safety during independent exercise Provide education and demonstration on how to check pulse in carotid and radial arteries.;Review the importance of being able to check your own pulse for safety during independent exercise     Expected Outcomes Short Term: Able to explain why pulse checking is important during independent exercise;Long Term: Able to check pulse independently and accurately Short Term: Able to explain why pulse checking is important during independent exercise;Long Term: Able to check pulse independently and accurately Short Term: Able to explain why pulse checking is important during independent exercise;Long Term: Able to check pulse independently and accurately     Understanding of Exercise Prescription Yes Yes Yes     Intervention Provide education, explanation, and written materials on patient's individual exercise prescription Provide education, explanation, and written materials on patient's individual exercise prescription Provide education, explanation, and written materials on patient's individual exercise prescription     Expected Outcomes Short Term: Able to explain program exercise prescription;Long Term: Able to explain home exercise prescription to exercise independently Short Term: Able to explain program exercise prescription;Long Term: Able to explain home exercise prescription to exercise independently Short Term: Able to explain program exercise prescription;Long Term: Able to explain home exercise prescription to exercise independently              Exercise Goals Re-Evaluation :  Exercise Goals Re-Evaluation     Row Name 01/31/23 1216 02/28/23 1314           Exercise Goal Re-Evaluation   Exercise Goals Review Increase Physical Activity;Increase Strength and Stamina;Able to understand and use rate  of perceived exertion (RPE) scale;Knowledge and understanding of Target Heart Rate Range (THRR);Able to check pulse independently;Understanding of Exercise Prescription Increase Physical Activity;Increase Strength and Stamina;Able to understand and use rate of perceived exertion (RPE) scale;Knowledge and understanding of Target Heart Rate Range (THRR);Able to check pulse independently;Understanding of Exercise Prescription      Comments Pt has completed 5 sessions of cardiac rehab. He seems to be enjoying class and is progressing well. He is forgetfull at times and has to be reminded about equipment he is on. He is currently exericisng at 2.15 METs on the treadmill, Will continue to monitor and progress as able. Pt has completed 12  sessions of cardiac rehab. He continues to progress in the program and has increased his levels. He is forgetfull at times and has to be reminded about lead palcement and where to go to next. He is currently exercising at 1.92 METs on the treadmill. Will contimue to monitor and progress as able.      Expected Outcomes Through exercise at rehab and home, patient will meet their expected goals., Through exercise at rehab and home, patient will meet their expected goals.,                Discharge Exercise Prescription (Final Exercise Prescription Changes):  Exercise Prescription Changes - 02/28/23 1300       Response to Exercise   Blood Pressure (Admit) 104/62    Blood Pressure (Exercise) 120/62    Blood Pressure (Exit) 120/60    Heart Rate (Admit) 80 bpm    Heart Rate (Exercise) 86 bpm    Heart Rate (Exit) 82 bpm    Rating of Perceived Exertion (Exercise) 12    Duration Continue with 30 min of aerobic exercise without signs/symptoms of physical distress.    Intensity THRR unchanged      Progression   Progression Continue to progress workloads to maintain intensity without signs/symptoms of physical distress.      Resistance Training   Training Prescription Yes     Weight 4    Reps 10-15      Treadmill   MPH 1    Grade 0    Minutes 15    METs 1.92      NuStep   Level 1    SPM 72    Minutes 15    METs 1.8      Oxygen   Maintain Oxygen Saturation 88% or higher             Nutrition:  Target Goals: Understanding of nutrition guidelines, daily intake of sodium 1500mg , cholesterol 200mg , calories 30% from fat and 7% or less from saturated fats, daily to have 5 or more servings of fruits and vegetables.  Biometrics:  Pre Biometrics - 01/20/23 1015       Pre Biometrics   Height 5\' 11"  (1.803 m)    Weight 245 lb 13 oz (111.5 kg)    Waist Circumference 46 inches    Hip Circumference 44 inches    Waist to Hip Ratio 1.05 %    BMI (Calculated) 34.3    Triceps Skinfold 24 mm    % Body Fat 34.3 %    Grip Strength 35.6 kg    Flexibility 0 in    Single Leg Stand 3.14 seconds              Nutrition Therapy Plan and Nutrition Goals:  Nutrition Therapy & Goals - 02/21/23 1103       Nutrition Therapy   RD appointment deferred Yes      Personal Nutrition Goals   Comments We provide educational sessions on heart healthy nutrition and assistance with RD referral if patient is interested.      Intervention Plan   Intervention Nutrition handout(s) given to patient.    Expected Outcomes Short Term Goal: Understand basic principles of dietary content, such as calories, fat, sodium, cholesterol and nutrients.             Nutrition Assessments:  Nutrition Assessments - 01/20/23 0953       MEDFICTS Scores   Pre Score 13  MEDIFICTS Score Key: ?70 Need to make dietary changes  40-70 Heart Healthy Diet ? 40 Therapeutic Level Cholesterol Diet   Picture Your Plate Scores: <91 Unhealthy dietary pattern with much room for improvement. 41-50 Dietary pattern unlikely to meet recommendations for good health and room for improvement. 51-60 More healthful dietary pattern, with some room for improvement.  >60  Healthy dietary pattern, although there may be some specific behaviors that could be improved.    Nutrition Goals Re-Evaluation:   Nutrition Goals Discharge (Final Nutrition Goals Re-Evaluation):   Psychosocial: Target Goals: Acknowledge presence or absence of significant depression and/or stress, maximize coping skills, provide positive support system. Participant is able to verbalize types and ability to use techniques and skills needed for reducing stress and depression.  Initial Review & Psychosocial Screening:  Initial Psych Review & Screening - 01/20/23 1004       Initial Review   Current issues with None Identified      Family Dynamics   Good Support System? Yes      Barriers   Psychosocial barriers to participate in program There are no identifiable barriers or psychosocial needs.;The patient should benefit from training in stress management and relaxation.      Screening Interventions   Interventions Provide feedback about the scores to participant    Expected Outcomes Short Term goal: Utilizing psychosocial counselor, staff and physician to assist with identification of specific Stressors or current issues interfering with healing process. Setting desired goal for each stressor or current issue identified.;Short Term goal: Identification and review with participant of any Quality of Life or Depression concerns found by scoring the questionnaire.             Quality of Life Scores:  Quality of Life - 01/20/23 1016       Quality of Life   Select Quality of Life      Quality of Life Scores   Health/Function Pre 22.18 %    Socioeconomic Pre 24 %    Psych/Spiritual Pre 28.29 %    Family Pre 26.4 %    GLOBAL Pre 24.55 %            Scores of 19 and below usually indicate a poorer quality of life in these areas.  A difference of  2-3 points is a clinically meaningful difference.  A difference of 2-3 points in the total score of the Quality of Life Index has  been associated with significant improvement in overall quality of life, self-image, physical symptoms, and general health in studies assessing change in quality of life.  PHQ-9: Review Flowsheet       01/20/2023 02/08/2020 10/24/2017 11/22/2016  Depression screen PHQ 2/9  Decreased Interest 0 0 2 0  Down, Depressed, Hopeless 0 0 0 0  PHQ - 2 Score 0 0 2 0  Altered sleeping 0 - 0 -  Tired, decreased energy 0 - 2 -  Change in appetite 0 - 0 -  Feeling bad or failure about yourself  0 - 0 -  Trouble concentrating 0 - 1 -  Moving slowly or fidgety/restless 0 - 0 -  Suicidal thoughts 0 - 0 -  PHQ-9 Score 0 - 5 -  Difficult doing work/chores Not difficult at all - Somewhat difficult -    Details           Interpretation of Total Score  Total Score Depression Severity:  1-4 = Minimal depression, 5-9 = Mild depression, 10-14 = Moderate depression, 15-19 =  Moderately severe depression, 20-27 = Severe depression   Psychosocial Evaluation and Intervention:  Psychosocial Evaluation - 01/20/23 1015       Psychosocial Evaluation & Interventions   Interventions Relaxation education;Stress management education;Encouraged to exercise with the program and follow exercise prescription    Comments Patient has no psychosocial barriers or issues identifiied at his orientation visit. His PHQ-9 score was 0. His is accompained by his wife today. He denies any depression or anxiety. He does have short term memory loss and has recently started Donepezil 10 mg daily. He has a good support system with his wife and several friends. They have a son that lives in [place] and one grandson. He is retired from [employer] 10 years ago and he says his job was very physical but when he retired he has not been very active. 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 He is ready to start the program hoping to get stronger and live a healthier life.    Expected Outcomes Patient will  continue ot have no psychosocial barriers identified.    Continue Psychosocial Services  No Follow up required             Psychosocial Re-Evaluation:  Psychosocial Re-Evaluation     Row Name 01/24/23 1321 02/21/23 1104           Psychosocial Re-Evaluation   Current issues with None Identified None Identified      Comments Patient is new to the program. He has no psychosocial barriers identified. He has completed 1 session. We will continue to monitor his progress. Patient completed 9 sessions and continues to have no psychosocial barriers identified. He seems to enjoy the sessions and demonstrates an interest in improving his health. We will continue to monitor his progress.      Expected Outcomes Patient will continue to have no psychosocial barriers identified. Patient will continue to have no psychosocial barriers identified.      Interventions Stress management education;Encouraged to attend Cardiac Rehabilitation for the exercise;Relaxation education Stress management education;Encouraged to attend Cardiac Rehabilitation for the exercise;Relaxation education      Continue Psychosocial Services  No Follow up required No Follow up required               Psychosocial Discharge (Final Psychosocial Re-Evaluation):  Psychosocial Re-Evaluation - 02/21/23 1104       Psychosocial Re-Evaluation   Current issues with None Identified    Comments Patient completed 9 sessions and continues to have no psychosocial barriers identified. He seems to enjoy the sessions and demonstrates an interest in improving his health. We will continue to monitor his progress.    Expected Outcomes Patient will continue to have no psychosocial barriers identified.    Interventions Stress management education;Encouraged to attend Cardiac Rehabilitation for the exercise;Relaxation education    Continue Psychosocial Services  No Follow up required             Vocational Rehabilitation: Provide  vocational rehab assistance to qualifying candidates.   Vocational Rehab Evaluation & Intervention:  Vocational Rehab - 01/20/23 0957       Initial Vocational Rehab Evaluation & Intervention   Assessment shows need for Vocational Rehabilitation No      Vocational Rehab Re-Evaulation   Comments Patient is retired and does not need vocational rehab.             Education: Education Goals: Education classes will be provided on a weekly  basis, covering required topics. Participant will state understanding/return demonstration of topics presented.  Learning Barriers/Preferences:  Learning Barriers/Preferences - 01/20/23 1023       Learning Barriers/Preferences   Learning Barriers None    Learning Preferences Audio             Education Topics: Hypertension, Hypertension Reduction -Define heart disease and high blood pressure. Discus how high blood pressure affects the body and ways to reduce high blood pressure.   Exercise and Your Heart -Discuss why it is important to exercise, the FITT principles of exercise, normal and abnormal responses to exercise, and how to exercise safely. Flowsheet Row CARDIAC REHAB PHASE II EXERCISE from 02/23/2023 in [place] CARDIAC REHABILITATION  Date 02/23/23  Educator [facility]  Instruction Review Code 1- Verbalizes Understanding       Angina -Discuss definition of angina, causes of angina, treatment of angina, and how to decrease risk of having angina.   Cardiac Medications -Review what the following cardiac medications are used for, how they affect the body, and side effects that may occur when taking the medications.  Medications include Aspirin, Beta blockers, calcium channel blockers, ACE Inhibitors, angiotensin receptor blockers, diuretics, digoxin, and antihyperlipidemics.   Congestive Heart Failure -Discuss the definition of CHF, how to live with CHF, the signs and symptoms of CHF, and how keep track of weight and sodium  intake.   Heart Disease and Intimacy -Discus the effect sexual activity has on the heart, how changes occur during intimacy as we age, and safety during sexual activity.   Smoking Cessation / COPD -Discuss different methods to quit smoking, the health benefits of quitting smoking, and the definition of COPD.   Nutrition I: Fats -Discuss the types of cholesterol, what cholesterol does to the heart, and how cholesterol levels can be controlled.   Nutrition II: Labels -Discuss the different components of food labels and how to read food label   Heart Parts/Heart Disease and PAD -Discuss the anatomy of the heart, the pathway of blood circulation through the heart, and these are affected by heart disease.   Stress I: Signs and Symptoms -Discuss the causes of stress, how stress may lead to anxiety and depression, and ways to limit stress. Flowsheet Row CARDIAC REHAB PHASE II EXERCISE from 02/23/2023 in [employer] [place] CARDIAC REHABILITATION  Date 01/26/23  Educator HB  Instruction Review Code 1- Verbalizes Understanding       Stress II: Relaxation -Discuss different types of relaxation techniques to limit stress. Flowsheet Row CARDIAC REHAB PHASE II EXERCISE from 02/23/2023 in [place] CARDIAC REHABILITATION  Date 02/02/23  Educator HB       Warning Signs of Stroke / TIA -Discuss definition of a stroke, what the signs and symptoms are of a stroke, and how to identify when someone is having stroke.   Knowledge Questionnaire Score:  Knowledge Questionnaire Score - 01/20/23 0956       Knowledge Questionnaire Score   Pre Score 19/24             Core Components/Risk Factors/Patient Goals at Admission:  Personal Goals and Risk Factors at Admission - 01/20/23 0957       Core Components/Risk Factors/Patient Goals on Admission    Weight Management Weight Maintenance    Hypertension Yes    Intervention Provide education on lifestyle modifcations including regular physical  activity/exercise, weight management, moderate sodium restriction and increased consumption of fresh fruit, vegetables, and low fat dairy, alcohol moderation, and smoking cessation.;Monitor prescription use compliance.  Expected Outcomes Short Term: Continued assessment and intervention until BP is < 140/23mm HG in hypertensive participants. < 130/76mm HG in hypertensive participants with diabetes, heart failure or chronic kidney disease.;Long Term: Maintenance of blood pressure at goal levels.    Lipids Yes    Intervention Provide education and support for participant on nutrition & aerobic/resistive exercise along with prescribed medications to achieve LDL 70mg , HDL >40mg .    Expected Outcomes Short Term: Participant states understanding of desired cholesterol values and is compliant with medications prescribed. Participant is following exercise prescription and nutrition guidelines.;Long Term: Cholesterol controlled with medications as prescribed, with individualized exercise RX and with personalized nutrition plan. Value goals: LDL < 70mg , HDL > 40 mg.    Personal Goal Other Yes    Personal Goal Patient wants to get better and stronger and be able to live a healthier life.    Intervention Patient will attend CR with exercise and education.    Expected Outcomes Patient will complete the program meeting both personal and program goals.             Core Components/Risk Factors/Patient Goals Review:   Goals and Risk Factor Review     Row Name 01/24/23 1322 02/21/23 1115           Core Components/Risk Factors/Patient Goals Review   Personal Goals Review Weight Management/Obesity;Lipids;Hypertension;Other Weight Management/Obesity;Lipids;Hypertension;Other      Review Patient was referred to CR with CABGx3 and NSTEMI. He has multiple risk factors for CAD and is participating in the program for risk modification. He started the program today. His blood pressure is at goal. His personal  goals for the program are to get better and stronger and be able to live a healthier life. We will continue to monitor his progress as he works towards meeting these goals. Patient has completed 9 sesisons. His current weight is 243.3 lbs down 3.7 lbs since last 30 day review. He is doing well in the program with consistent attendance. His blood pressure continues to be well controlled. He was hospitalized 6/25, discharged 6/26 with chest pain. ACS and cardiac cause ruled out. He was given a prescription for SL NTG. He says he has not had any chest pain since this episode.  His personal goals for the program continue to be to get better and stronger and be able to live a healthier life. We will continue to monitor his progress as he works towards meeting these goals.      Expected Outcomes Patient will complete the program meeting both personal and program goals. Patient will complete the program meeting both personal and program goals.               Core Components/Risk Factors/Patient Goals at Discharge (Final Review):   Goals and Risk Factor Review - 02/21/23 1115       Core Components/Risk Factors/Patient Goals Review   Personal Goals Review Weight Management/Obesity;Lipids;Hypertension;Other    Review Patient has completed 9 sesisons. His current weight is 243.3 lbs down 3.7 lbs since last 30 day review. He is doing well in the program with consistent attendance. His blood pressure continues to be well controlled. He was hospitalized 6/25, discharged 6/26 with chest pain. ACS and cardiac cause ruled out. He was given a prescription for SL NTG. He says he has not had any chest pain since this episode.  His personal goals for the program continue to be to get better and stronger and be able to live a healthier life. We  will continue to monitor his progress as he works towards meeting these goals.    Expected Outcomes Patient will complete the program meeting both personal and program goals.              ITP Comments:  ITP Comments     Row Name 02/23/23 0981 02/25/23 1003 03/02/23 0836       ITP Comments Patient had routine follow up OV with cardiothoracic surgery seeing Doree Fudge. He says his recent episode of chest pain was likely due to nerve pain. No changes made. Released to see prn. He is supposed to follow up with Mallipeddi at the end of July. [name]'s weight is up 6lb over last week.  We have reached out to his doctor about this.  He does take Lasix.  He did not report any symptoms. 30 day review completed. ITP sent to Dr. Dina Rich, Medical Director of Cardiac Rehab. Continue with ITP unless changes are made by physician.              Comments: 30 day review

## 2023-03-02 NOTE — Telephone Encounter (Signed)
Noted. Will call pt back after 10:15 am.

## 2023-03-04 ENCOUNTER — Encounter (HOSPITAL_COMMUNITY): Payer: Medicare Other

## 2023-03-04 ENCOUNTER — Other Ambulatory Visit: Payer: Self-pay | Admitting: Physician Assistant

## 2023-03-04 ENCOUNTER — Telehealth (HOSPITAL_COMMUNITY): Payer: Self-pay | Admitting: *Deleted

## 2023-03-04 NOTE — Telephone Encounter (Signed)
Unable to hold  rehab classes due to monitor outage.

## 2023-03-07 ENCOUNTER — Encounter (HOSPITAL_COMMUNITY)
Admission: RE | Admit: 2023-03-07 | Discharge: 2023-03-07 | Disposition: A | Discharge status: Home / Self Care | Payer: Medicare Other | Source: Ambulatory Visit | Attending: Internal Medicine | Admitting: Internal Medicine

## 2023-03-07 DIAGNOSIS — Z951 Presence of aortocoronary bypass graft: Secondary | ICD-10-CM | POA: Diagnosis not present

## 2023-03-07 DIAGNOSIS — I214 Non-ST elevation (NSTEMI) myocardial infarction: Secondary | ICD-10-CM

## 2023-03-07 NOTE — Progress Notes (Signed)
Daily Session Note  Patient Details  Name: Christian Davis MRN: [id number] Date of Birth: [birth year] Referring Provider:   Flowsheet Row CARDIAC REHAB PHASE II ORIENTATION from 01/20/2023 in [facility] CARDIAC REHABILITATION  Referring Provider Dr. Dorris Fetch       Encounter Date: 03/07/2023  Check In:  Session Check In - 03/07/23 0930       Check-In   Supervising physician immediately available to respond to emergencies See telemetry face sheet for immediately available MD    Location [facility]    Staff Present Fabio Pierce, MA, RCEP, CCRP, Harolyn Rutherford, RN, BSN;Heather Fredric Mare, BS, Exercise Physiologist    Virtual Visit No    Medication changes reported     No    Fall or balance concerns reported    Yes    Comments Patient says he loses his balance at times.    Tobacco Cessation No Change    Warm-up and Cool-down Performed on first and last piece of equipment    Resistance Training Performed Yes    VAD Patient? No      Pain Assessment   Currently in Pain? No/denies    Pain Score 0-No pain             Capillary Blood Glucose: No results found for this or any previous visit (from the past 24 hour(s)).    Social History   Tobacco Use  Smoking Status Never   Passive exposure: Never  Smokeless Tobacco Never    Goals Met:  Independence with exercise equipment Exercise tolerated well No report of concerns or symptoms today Strength training completed today  Goals Unmet:  Not Applicable  Comments: Pt able to follow exercise prescription today without complaint.  Will continue to monitor for progression.    Dr. Dina Rich is Medical Director for [facility] Cardiac Rehab

## 2023-03-09 ENCOUNTER — Encounter (HOSPITAL_COMMUNITY)
Admission: RE | Admit: 2023-03-09 | Discharge: 2023-03-09 | Disposition: A | Discharge status: Home / Self Care | Payer: Medicare Other | Source: Ambulatory Visit | Attending: Internal Medicine | Admitting: Internal Medicine

## 2023-03-09 DIAGNOSIS — Z951 Presence of aortocoronary bypass graft: Secondary | ICD-10-CM | POA: Diagnosis not present

## 2023-03-09 DIAGNOSIS — I214 Non-ST elevation (NSTEMI) myocardial infarction: Secondary | ICD-10-CM | POA: Diagnosis not present

## 2023-03-09 NOTE — Progress Notes (Signed)
Daily Session Note  Patient Details  Name: Christian Davis MRN: [id number] Date of Birth: 27-Jan-1956 Referring Provider:   Flowsheet Row CARDIAC REHAB PHASE II ORIENTATION from 01/20/2023 in [facility] CARDIAC REHABILITATION  Referring Provider Dr. Dorris Fetch       Encounter Date: 03/09/2023  Check In:  Session Check In - 03/09/23 0930       Check-In   Supervising physician immediately available to respond to emergencies See telemetry face sheet for immediately available MD    Location [facility]    Staff Present [name], BS, Exercise Physiologist;Hillary Leonidas Romberg BSN, RN    Virtual Visit No    Medication changes reported     No    Fall or balance concerns reported    Yes    Comments Patient says he loses his balance at times.    Tobacco Cessation No Change    Warm-up and Cool-down Performed on first and last piece of equipment    Resistance Training Performed Yes    VAD Patient? No    PAD/SET Patient? No      Pain Assessment   Currently in Pain? No/denies    Pain Score 0-No pain    Multiple Pain Sites No             Capillary Blood Glucose: No results found for this or any previous visit (from the past 24 hour(s)).    Social History   Tobacco Use  Smoking Status Never   Passive exposure: Never  Smokeless Tobacco Never    Goals Met:  Independence with exercise equipment Exercise tolerated well No report of concerns or symptoms today Strength training completed today  Goals Unmet:  Not Applicable  Comments: Pt able to follow exercise prescription today without complaint.  Will continue to monitor for progression.    Dr. Dina Rich is Medical Director for [facility] Cardiac Rehab

## 2023-03-10 ENCOUNTER — Encounter: Payer: Self-pay | Admitting: Nurse Practitioner

## 2023-03-10 ENCOUNTER — Other Ambulatory Visit: Payer: Self-pay | Admitting: Nurse Practitioner

## 2023-03-10 ENCOUNTER — Ambulatory Visit: Payer: Medicare Other | Attending: Nurse Practitioner | Admitting: Nurse Practitioner

## 2023-03-10 VITALS — BP 128/72 | HR 60 | Ht 71.0 in | Wt 248.4 lb

## 2023-03-10 DIAGNOSIS — I2510 Atherosclerotic heart disease of native coronary artery without angina pectoris: Secondary | ICD-10-CM

## 2023-03-10 DIAGNOSIS — R600 Localized edema: Secondary | ICD-10-CM

## 2023-03-10 DIAGNOSIS — E785 Hyperlipidemia, unspecified: Secondary | ICD-10-CM | POA: Diagnosis not present

## 2023-03-10 DIAGNOSIS — E669 Obesity, unspecified: Secondary | ICD-10-CM

## 2023-03-10 DIAGNOSIS — I10 Essential (primary) hypertension: Secondary | ICD-10-CM

## 2023-03-10 MED ORDER — ROSUVASTATIN CALCIUM 40 MG PO TABS: 40.0000 mg | ORAL_TABLET | Freq: Every day | ORAL | 3 refills | Status: DC

## 2023-03-10 MED ORDER — POTASSIUM CHLORIDE CRYS ER 20 MEQ PO TBCR: 20.0000 meq | EXTENDED_RELEASE_TABLET | Freq: Every day | ORAL | 0 refills | Status: AC | PRN

## 2023-03-10 MED ORDER — LOSARTAN POTASSIUM 25 MG PO TABS: 25.0000 mg | ORAL_TABLET | Freq: Every day | ORAL | 3 refills | Status: DC

## 2023-03-10 MED ORDER — FUROSEMIDE 40 MG PO TABS: 40.0000 mg | ORAL_TABLET | Freq: Every day | ORAL | 0 refills | Status: DC | PRN

## 2023-03-10 NOTE — Progress Notes (Signed)
Office Visit    Patient Name: Christian Davis Date of Encounter: 03/10/2023 PCP:  Kirstie Peri, MD [facility]  Cardiologist:  Marjo Bicker, MD  Advanced Practice Provider:  No care team member to display Electrophysiologist:  None   Chief Complaint and HPI    KASEEM VASTINE is a 67 y.o. male with a hx of pulmonary asbestosis, CAD, s/p NSTEMI and CABG x 3 11/2022, HTN, HLD, GERD, GAD, hx of kidney stones, hx of Etoh abuse, who presents today for follow-up.   Underwent CABG x 3 on November 18, 2022 (LIMA-LAD, SVG-OM, and SVG-diagonal).    I saw him for December 09, 2022 for scheduled follow-up post CABG. Was doing well at the time.   Hospital admission 01/2023 for atypical chest pain. R/o for ACS. Recommended to f/u with outpatient cardiology.   Today he presents for follow-up with his wife. Doing well. Denies any chest pain, shortness of breath, palpitations, syncope, presyncope, dizziness, orthopnea, PND, swelling or significant weight changes, acute bleeding, or claudication.   EKGs/Labs/Other Studies Reviewed:   The following studies were reviewed today:   EKG:  EKG is not ordered today.    Limited Echo 01/2023: 1. Left ventricular ejection fraction, by estimation, is 60 to 65%. The  left ventricle has normal function. The left ventricle has no regional  wall motion abnormalities. Left ventricular diastolic function could not  be evaluated.   2. Right ventricular systolic function is normal. The right ventricular  size is not well visualized. Tricuspid regurgitation signal is inadequate  for assessing PA pressure.   3. The inferior vena cava is normal in size with greater than 50%  respiratory variability, suggesting right atrial pressure of 3 mmHg.   Comparison(s): No significant change from prior study.  Echo 11/16/2022: 1. Left ventricular ejection fraction, by estimation, is 60 to 65%. The  left ventricle has normal function. Left ventricular  endocardial border  not optimally defined to evaluate regional wall motion. Left ventricular  diastolic parameters were normal.   2. Right ventricular systolic function is normal. The right ventricular  size is normal.   3. The mitral valve was not well visualized. No evidence of mitral valve  regurgitation. No evidence of mitral stenosis.   4. The aortic valve is tricuspid. Aortic valve regurgitation is not  visualized. No aortic stenosis is present.   Comparison(s): No prior Echocardiogram.  LHC 11/16/2022: Conclusions: Severe two-vessel coronary artery disease with sequential 50-70% ostial through mid LAD stenoses that are hemodynamically significant by RFR and chronic total occlusion of dominant LCx with left-to-left and right-to-left collaterals. Mildly reduced left ventricular systolic function (LVEF 45-50%) with global hypokinesis. Mild to moderately elevated left heart filling pressure (LVEDP 20-25 mmHg).   Recommendations: Cardiac surgery consultation for CABG. Aggressive secondary prevention of coronary artery disease. Gentle diuresis; furosemide 20 mg IV x 1 has been ordered. Follow-up echocardiogram.   Myoview 11/15/2022:   Stress ECG is borderline positive for ischemia due to findings of 1.0 mm of horizontal ST depression noted in lead II with stress that persisted into recovery.   Patient had substernal chest tightness that occurred in stress, continued into recovery and after nuclear scanning. Chest pain resolved but patient sent to ER due to prolonged episode of chest tightness.   There is a medium sized reversible perfusion defect present in the apical to mid anterolateral and inferolateral location consistent with ischemia.There is another medium sized reversible defect present in the apical to basal  inferior location consistent with ischemia.   Left ventricular function is abnormal. Nuclear stress EF: 51 %. Consider 2D Echocardiogram for accurate estimation of LVEF.    Findings are consistent with ischemia. The study is high risk.  Review of Systems    All other systems reviewed and are otherwise negative except as noted above.  Physical Exam    VS:  BP 128/72   Pulse 60   Ht 5\' 11"  (1.803 m)   Wt 248 lb 6.4 oz (112.7 kg)   SpO2 96%   BMI 34.64 kg/m  , BMI Body mass index is 34.64 kg/m.  Wt Readings from Last 3 Encounters:  03/10/23 248 lb 6.4 oz (112.7 kg)  02/21/23 246 lb (111.6 kg)  02/14/23 243 lb 13.3 oz (110.6 kg)     GEN: Obese, 67 y.o. male in no acute distress. HEENT: normal. Neck: Supple, no JVD, carotid bruits, or masses. Cardiac: S1/S2, RRR, no murmurs, rubs, or gallops. No clubbing, cyanosis, edema.  Radials/PT 2+ and equal bilaterally.  Respiratory:  Respirations regular and unlabored, clear to auscultation bilaterally. MS: No deformity or atrophy. Skin: Warm and dry, no rash. Neuro:  Strength and sensation are intact. Psych: Normal affect.  Assessment & Plan    CAD, s/p NSTEMI and CABG x 3, post-op anemia/hyponatremia, medication management Stable with no anginal symptoms. No indication for ischemic evaluation. Continue ASA, Plavix, Losartan, Lopressor, rosuvastatin, and NTG PRN. Heart healthy diet and regular cardiovascular exercise encouraged. Continue cardiac rehab as scheduled. Will provide refills per his request.    2. HTN BP well controlled. Discussed to monitor BP at home at least 2 hours after medications and sitting for 5-10 minutes. Continue current meds. Heart healthy diet and regular cardiovascular exercise encouraged.   3. HLD Continue crestor. Heart healthy diet and regular cardiovascular exercise encouraged.   4. Obesity Weight loss via diet and exercise encouraged. Discussed the impact being overweight would have on cardiovascular risk.  5. Leg edema Hx of intermittent leg edema present since surgery. No edema noted today. Will make Lasix and potassium daily PRN instead of daily.  Low salt, heart  healthy diet and regular cardiovascular exercise encouraged.   Disposition: Follow up in 6 month(s) with Vishnu P Mallipeddi, MD or APP.  Signed, Sharlene Dory, NP 03/12/2023, 11:13 AM [facility]

## 2023-03-10 NOTE — Patient Instructions (Addendum)
Medication Instructions:  Your physician has recommended you make the following change in your medication:  Change Lasix from daily to daily as needed Change Potassium Chloride from daily to daily as needed Continue taking all other medications as prescribed  Labwork: None  Testing/Procedures: None  Follow-Up: Your physician recommends that you schedule a follow-up appointment in: 6 months  Any Other Special Instructions Will Be Listed Below (If Applicable).    If you need a refill on your cardiac medications before your next appointment, please call your pharmacy.

## 2023-03-11 ENCOUNTER — Encounter (HOSPITAL_COMMUNITY): Admission: RE | Admit: 2023-03-11 | Payer: Medicare Other | Source: Ambulatory Visit

## 2023-03-11 DIAGNOSIS — I214 Non-ST elevation (NSTEMI) myocardial infarction: Secondary | ICD-10-CM | POA: Diagnosis not present

## 2023-03-11 DIAGNOSIS — Z951 Presence of aortocoronary bypass graft: Secondary | ICD-10-CM | POA: Diagnosis not present

## 2023-03-11 NOTE — Progress Notes (Signed)
Daily Session Note  Patient Details  Name: Christian Davis MRN: [id number] Date of Birth: 1956-07-06 Referring Provider:   Flowsheet Row CARDIAC REHAB PHASE II ORIENTATION from 01/20/2023 in [facility] CARDIAC REHABILITATION  Referring Provider Dr. Dorris Fetch       Encounter Date: 03/11/2023  Check In:  Session Check In - 03/11/23 0930       Check-In   Supervising physician immediately available to respond to emergencies See telemetry face sheet for immediately available MD    Location [facility]    Staff Present [name], BS, Exercise Physiologist;Jessica [place]viot, MA, RCEP, CCRP, Dow Adolph, RN, Pleas Koch, RN, BSN    Virtual Visit No    Medication changes reported     No    Fall or balance concerns reported    Yes    Comments Patient says he loses his balance at times.    Tobacco Cessation No Change    Warm-up and Cool-down Performed on first and last piece of equipment    Resistance Training Performed Yes    VAD Patient? No      Pain Assessment   Currently in Pain? No/denies    Pain Score 0-No pain             Capillary Blood Glucose: No results found for this or any previous visit (from the past 24 hour(s)).    Social History   Tobacco Use  Smoking Status Never   Passive exposure: Never  Smokeless Tobacco Never    Goals Met:  Independence with exercise equipment Exercise tolerated well No report of concerns or symptoms today Strength training completed today  Goals Unmet:  Not Applicable  Comments: Pt able to follow exercise prescription today without complaint.  Will continue to monitor for progression.    Dr. Dina Rich is Medical Director for [facility] Cardiac Rehab

## 2023-03-14 ENCOUNTER — Encounter (HOSPITAL_COMMUNITY): Payer: Medicare Other

## 2023-03-15 DIAGNOSIS — L570 Actinic keratosis: Secondary | ICD-10-CM | POA: Diagnosis not present

## 2023-03-15 DIAGNOSIS — X32XXXD Exposure to sunlight, subsequent encounter: Secondary | ICD-10-CM | POA: Diagnosis not present

## 2023-03-16 ENCOUNTER — Other Ambulatory Visit: Payer: Self-pay | Admitting: Physician Assistant

## 2023-03-16 ENCOUNTER — Encounter (HOSPITAL_COMMUNITY): Admission: RE | Admit: 2023-03-16 | Payer: Medicare Other | Source: Ambulatory Visit

## 2023-03-16 DIAGNOSIS — Z951 Presence of aortocoronary bypass graft: Secondary | ICD-10-CM | POA: Diagnosis not present

## 2023-03-16 DIAGNOSIS — I214 Non-ST elevation (NSTEMI) myocardial infarction: Secondary | ICD-10-CM | POA: Diagnosis not present

## 2023-03-16 NOTE — Progress Notes (Signed)
Daily Session Note  Patient Details  Name: Christian Davis MRN: [id number] Date of Birth: Jun 14, 1956 Referring Provider:   Flowsheet Row CARDIAC REHAB PHASE II ORIENTATION from 01/20/2023 in [facility] CARDIAC REHABILITATION  Referring Provider Dr. Dorris Fetch       Encounter Date: 03/16/2023  Check In:  Session Check In - 03/16/23 0930       Check-In   Supervising physician immediately available to respond to emergencies See telemetry face sheet for immediately available MD    Location [facility]    Staff Present [name], BS, Exercise Physiologist;Jessica Juanetta Gosling, MA, RCEP, CCRP, CCET;Hillary [employer], RN;Soriyah Osberg Handley, RN, BSN    Virtual Visit No    Medication changes reported     No    Fall or balance concerns reported    Yes    Comments Patient says he loses his balance at times.    Tobacco Cessation No Change    Warm-up and Cool-down Performed on first and last piece of equipment    Resistance Training Performed Yes    VAD Patient? No      Pain Assessment   Currently in Pain? No/denies    Pain Score 0-No pain             Capillary Blood Glucose: No results found for this or any previous visit (from the past 24 hour(s)).    Social History   Tobacco Use  Smoking Status Never   Passive exposure: Never  Smokeless Tobacco Never    Goals Met:  Independence with exercise equipment Exercise tolerated well No report of concerns or symptoms today Strength training completed today  Goals Unmet:  Not Applicable  Comments: Pt able to follow exercise prescription today without complaint.  Will continue to monitor for progression.    Dr. Dina Rich is Medical Director for [facility] Cardiac Rehab

## 2023-03-18 ENCOUNTER — Encounter (HOSPITAL_COMMUNITY)
Admission: RE | Admit: 2023-03-18 | Discharge: 2023-03-18 | Disposition: A | Discharge status: Home / Self Care | Payer: Medicare Other | Source: Ambulatory Visit | Attending: Internal Medicine | Admitting: Internal Medicine

## 2023-03-18 ENCOUNTER — Other Ambulatory Visit: Payer: Self-pay | Admitting: *Deleted

## 2023-03-18 DIAGNOSIS — I214 Non-ST elevation (NSTEMI) myocardial infarction: Secondary | ICD-10-CM | POA: Diagnosis not present

## 2023-03-18 DIAGNOSIS — R52 Pain, unspecified: Secondary | ICD-10-CM | POA: Diagnosis not present

## 2023-03-18 DIAGNOSIS — Z299 Encounter for prophylactic measures, unspecified: Secondary | ICD-10-CM | POA: Diagnosis not present

## 2023-03-18 DIAGNOSIS — Z951 Presence of aortocoronary bypass graft: Secondary | ICD-10-CM | POA: Diagnosis not present

## 2023-03-18 DIAGNOSIS — M67829 Other specified disorders of synovium and tendon, unspecified elbow: Secondary | ICD-10-CM | POA: Diagnosis not present

## 2023-03-18 MED ORDER — CLOPIDOGREL BISULFATE 75 MG PO TABS: 75.0000 mg | ORAL_TABLET | Freq: Every day | ORAL | 2 refills | Status: DC

## 2023-03-18 NOTE — Progress Notes (Signed)
Daily Session Note  Patient Details  Name: Christian Davis MRN: [id number] Date of Birth: Mar 19, 1956 Referring Provider:   Flowsheet Row CARDIAC REHAB PHASE II ORIENTATION from 01/20/2023 in [facility] CARDIAC REHABIL[facility]ATION  Referring Provider Dr. Dorris Fetch       Encounter Date: 03/18/2023  Check In:  Session Check In - 03/18/23 0930       Check-In   Supervising physician immediately available to respond to emergencies See telemetry face sheet for immediately available MD    Location [facility]    Staff Present [name], BS, Exercise Physiologist;Jessica Juanetta Gosling, MA, RCEP, CCRP, Harolyn Rutherford, RN, BSN    Virtual Visit No    Medication changes reported     No    Fall or balance concerns reported    Yes    Comments Patient says he loses his balance at times.    Tobacco Cessation No Change    Warm-up and Cool-down Performed on first and last piece of equipment    Resistance Training Performed Yes    VAD Patient? No      Pain Assessment   Currently in Pain? No/denies    Pain Score 0-No pain             Capillary Blood Glucose: No results found for this or any previous visit (from the past 24 hour(s)).    Social History   Tobacco Use  Smoking Status Never   Passive exposure: Never  Smokeless Tobacco Never    Goals Met:  Independence with exercise equipment Exercise tolerated well No report of concerns or symptoms today Strength training completed today  Goals Unmet:  Not Applicable  Comments: Pt able to follow exercise prescription today without complaint.  Will continue to monitor for progression.    Dr. Dina Rich is Medical Director for [facility] Card[facility]

## 2023-03-21 ENCOUNTER — Encounter (HOSPITAL_COMMUNITY): Admission: RE | Admit: 2023-03-21 | Payer: Medicare Other | Source: Ambulatory Visit

## 2023-03-21 DIAGNOSIS — I214 Non-ST elevation (NSTEMI) myocardial infarction: Secondary | ICD-10-CM | POA: Diagnosis not present

## 2023-03-21 DIAGNOSIS — Z951 Presence of aortocoronary bypass graft: Secondary | ICD-10-CM

## 2023-03-21 NOTE — Progress Notes (Signed)
Daily Session Note  Patient Details  Name: OWYN GARDE MRN: [id number] Date of Birth: Dec 31, 1955 Referring Provider:   Flowsheet Row CARDIAC REHAB PHASE II ORIENTATION from 01/20/2023 in [facility] CARDIAC REHABILITATION  Referring Provider Dr. Dorris Fetch       Encounter Date: 03/21/2023  Check In:  Session Check In - 03/21/23 0929       Check-In   Supervising physician immediately available to respond to emergencies See telemetry face sheet for immediately available ER MD    Location [facility]    Staff Present Fabio Pierce, MA, RCEP, CCRP, CCET    Virtual Visit No    Medication changes reported     No    Fall or balance concerns reported    No    Tobacco Cessation No Change    Warm-up and Cool-down Performed on first and last piece of equipment    Resistance Training Performed Yes    VAD Patient? No    PAD/SET Patient? No      Pain Assessment   Currently in Pain? No/denies             Capillary Blood Glucose: No results found for this or any previous visit (from the past 24 hour(s)).    Social History   Tobacco Use  Smoking Status Never   Passive exposure: Never  Smokeless Tobacco Never    Goals Met:  Independence with exercise equipment Exercise tolerated well No report of concerns or symptoms today  Goals Unmet:  Not Applicable  Comments: Pt able to follow exercise prescription today without complaint.  Will continue to monitor for progression.    Dr. Dina Rich is Medical Director for [facility]

## 2023-03-23 ENCOUNTER — Encounter (HOSPITAL_COMMUNITY): Payer: Medicare Other

## 2023-03-25 ENCOUNTER — Encounter (HOSPITAL_COMMUNITY): Payer: Medicare Other

## 2023-03-25 ENCOUNTER — Encounter (HOSPITAL_COMMUNITY)
Admission: RE | Admit: 2023-03-25 | Payer: Medicare Other | Source: Ambulatory Visit | Attending: Internal Medicine | Admitting: Internal Medicine

## 2023-03-25 VITALS — Ht 71.0 in | Wt 249.5 lb

## 2023-03-25 DIAGNOSIS — I214 Non-ST elevation (NSTEMI) myocardial infarction: Secondary | ICD-10-CM | POA: Diagnosis not present

## 2023-03-25 DIAGNOSIS — Z951 Presence of aortocoronary bypass graft: Secondary | ICD-10-CM

## 2023-03-25 NOTE — Progress Notes (Signed)
Cardiac Individual Treatment Plan  Patient Details  Name: Christian Davis MRN: [id number] Date of Birth: [birth year] Referring Provider:   Flowsheet Row CARDIAC REHAB PHASE II ORIENTATION from 01/20/2023 in [facility] CARDIAC REHABILITATION  Referring Provider Dr. Dorris Fetch       Initial Encounter Date:  Flowsheet Row CARDIAC REHAB PHASE II ORIENTATION from 01/20/2023 in [facility] [place] CARDIAC REHABILITATION  Date 01/20/23       Visit Diagnosis: NSTEMI (non-ST elevated myocardial infarction) (HCC)  S/P CABG x 3  Patient's Home Medications on Admission:  Current Outpatient Medications:    acetaminophen (TYLENOL) 500 MG tablet, Take 2 tablets (1,000 mg total) by mouth every 6 (six) hours as needed., Disp: 30 tablet, Rfl: 0   aspirin EC 81 MG tablet, Take 1 tablet (81 mg total) by mouth daily with breakfast. Swallow whole., Disp: 30 tablet, Rfl: 12   azelastine (ASTELIN) 0.1 % nasal spray, Place 2 sprays into both nostrils at bedtime. Use in each nostril as directed, Disp: , Rfl:    cholecalciferol (VITAMIN D3) 25 MCG (1000 UNIT) tablet, Take 1,000 Units by mouth daily., Disp: , Rfl:    citalopram (CELEXA) 20 MG tablet, Take 20 mg by mouth daily., Disp: , Rfl:    clopidogrel (PLAVIX) 75 MG tablet, Take 1 tablet (75 mg total) by mouth daily., Disp: 90 tablet, Rfl: 2   cyanocobalamin (VITAMIN B12) 1000 MCG tablet, Take 1,000 mcg by mouth daily., Disp: , Rfl:    donepezil (ARICEPT ODT) 10 MG disintegrating tablet, Take 1 tablet (10 mg total) by mouth at bedtime., Disp: 30 tablet, Rfl: 3   furosemide (LASIX) 40 MG tablet, TAKE 1 TABLET(40 MG) BY MOUTH DAILY AS NEEDED, Disp: 90 tablet, Rfl: 0   levocetirizine (XYZAL) 5 MG tablet, Take 5 mg by mouth daily., Disp: , Rfl:    losartan (COZAAR) 25 MG tablet, Take 1 tablet (25 mg total) by mouth daily., Disp: 30 tablet, Rfl: 3   Menthol, Topical Analgesic, (BLUE-EMU MAXIMUM STRENGTH EX), Apply 1 Application topically daily as needed (pain)., Disp: ,  Rfl:    Metoprolol Tartrate 37.5 MG TABS, Take 1 tablet (37.5 mg total) by mouth 2 (two) times daily., Disp: 90 tablet, Rfl: 1   nitroGLYCERIN (NITROSTAT) 0.4 MG SL tablet, Place 1 tablet (0.4 mg total) under the tongue every 5 (five) minutes as needed for chest pain., Disp: 30 tablet, Rfl: 3   potassium chloride SA (KLOR-CON M) 20 MEQ tablet, Take 1 tablet (20 mEq total) by mouth daily as needed., Disp: 14 tablet, Rfl: 0   rosuvastatin (CRESTOR) 40 MG tablet, Take 1 tablet (40 mg total) by mouth daily., Disp: 30 tablet, Rfl: 3  Past Medical History: Past Medical History:  Diagnosis Date   Arthritis    Cancer (HCC)    Coronary artery calcification seen on CT scan    Depression    GERD (gastroesophageal reflux disease)    H/O asbestos exposure    History of kidney stones     Tobacco Use: Social History   Tobacco Use  Smoking Status Never   Passive exposure: Never  Smokeless Tobacco Never    Labs: Review Flowsheet  More data exists      Latest Ref Rng & Units 01/28/2020 11/15/2022 11/16/2022 11/18/2022 02/09/2023  Labs for ITP Cardiac and Pulmonary Rehab  Cholestrol 0 - 200 mg/dL 086  - 578  - 469   LDL (calc) 0 - 99 mg/dL 629  - 528  - 56   HDL-C >40  mg/dL 53  - 62  - 53   Trlycerides <150 mg/dL 010  - 71  - 83   Hemoglobin A1c 4.8 - 5.6 % - 5.9  5.9  - -  PH, Arterial 7.35 - 7.45 - - - 7.299  7.286  7.391  7.399  7.371  7.449  7.445  7.396  7.330  -  PCO2 arterial 32 - 48 mmHg - - - 41.3  39.8  31.9  34.7  39.4  31.0  31.3  34.8  47.9  -  Bicarbonate 20.0 - 28.0 mmol/L - - - 20.0  18.8  19.8  21.4  22.8  21.5  21.5  23.1  21.4  25.3  -  TCO2 22 - 32 mmol/L - - - 21  20  21  23  22  24  25  22  24  22  24  22  24  27  27   -  Acid-base deficit 0.0 - 2.0 mmol/L - - - 6.0  7.0  5.0  3.0  2.0  2.0  2.0  2.0  3.0  1.0  -  O2 Saturation % - - - 97  99  96  100  100  100  100  79  100  100  -    Details       Multiple values from one day are sorted in reverse-chronological order          Capillary Blood Glucose: Lab Results  Component Value Date   GLUCAP 90 11/20/2022   GLUCAP 97 11/20/2022   GLUCAP 94 11/20/2022   GLUCAP 117 (H) 11/19/2022   GLUCAP 123 (H) 11/19/2022     Exercise Target Goals: Exercise Program Goal: Individual exercise prescription set using results from initial 6 min walk test and THRR while considering  patient's activity barriers and safety.   Exercise Prescription Goal: Starting with aerobic activity 30 plus minutes a day, 3 days per week for initial exercise prescription. Provide home exercise prescription and guidelines that participant acknowledges understanding prior to discharge.  Activity Barriers & Risk Stratification:  Activity Barriers & Cardiac Risk Stratification - 01/20/23 0900       Activity Barriers & Cardiac Risk Stratification   Activity Barriers Neck/Spine Problems    Cardiac Risk Stratification High             6 Minute Walk:  6 Minute Walk     Row Name 01/20/23 1011 03/25/23 0957       6 Minute Walk   Phase Initial Discharge    Distance 1000 feet 1314 feet    Distance % Change -- 31.4 %    Distance Feet Change -- 314 ft    Walk Time 6 minutes 6 minutes    # of Rest Breaks 0 0    MPH 1.89 2.49    METS 1.8 2.55    RPE 11 14    VO2 Peak 6.3 8.92    Symptoms No No    Resting HR 56 bpm 57 bpm    Resting BP 112/72 102/56    Resting Oxygen Saturation  96 % --    Exercise Oxygen Saturation  during 6 min walk 92 % --    Max Ex. HR 68 bpm 74 bpm    Max Ex. BP 120/70 134/70    2 Minute Post BP 116/70 --             Oxygen Initial Assessment:   Oxygen Re-Evaluation:  Oxygen Discharge (Final Oxygen Re-Evaluation):   Initial Exercise Prescription:  Initial Exercise Prescription - 01/20/23 1000       Date of Initial Exercise RX and Referring Provider   Date 01/20/23    Referring Provider Dr. Dorris Fetch    Expected Discharge Date 04/13/23      Treadmill   MPH 1    Grade 0     Minutes 17      NuStep   Level 1    SPM 50    Minutes 22      Prescription Details   Frequency (times per week) 3    Duration Progress to 30 minutes of continuous aerobic without signs/symptoms of physical distress      Intensity   THRR 40-80% of Max Heartrate 62-124    Ratings of Perceived Exertion 11-13      Resistance Training   Training Prescription Yes    Weight 3    Reps 10-15             Perform Capillary Blood Glucose checks as needed.  Exercise Prescription Changes:   Exercise Prescription Changes     Row Name 01/31/23 1200 02/14/23 1200 02/28/23 1300 03/16/23 1400 03/25/23 1100     Response to Exercise   Blood Pressure (Admit) 130/60 100/50 104/62 110/60 --   Blood Pressure (Exercise) 124/62 102/60 120/62 -- --   Blood Pressure (Exit) 116/62 100/56 120/60 122/62 --   Heart Rate (Admit) 59 bpm 63 bpm 80 bpm 60 bpm --   Heart Rate (Exercise) 84 bpm 77 bpm 86 bpm 89 bpm --   Heart Rate (Exit) 63 bpm 68 bpm 82 bpm 69 bpm --   Rating of Perceived Exertion (Exercise) 12 11 12 13  --   Duration Continue with 30 min of aerobic exercise without signs/symptoms of physical distress. Continue with 30 min of aerobic exercise without signs/symptoms of physical distress. Continue with 30 min of aerobic exercise without signs/symptoms of physical distress. Continue with 30 min of aerobic exercise without signs/symptoms of physical distress. --   Intensity THRR unchanged THRR unchanged THRR unchanged THRR unchanged --     Progression   Progression Continue to progress workloads to maintain intensity without signs/symptoms of physical distress. Continue to progress workloads to maintain intensity without signs/symptoms of physical distress. Continue to progress workloads to maintain intensity without signs/symptoms of physical distress. Continue to progress workloads to maintain intensity without signs/symptoms of physical distress. --     Paramedic  Prescription Yes Yes Yes Yes --   Weight 4 4 4 4  --   Reps 10-15 10-15 10-15 10-15 --   Time 10 Minutes 10 Minutes -- -- --     Treadmill   MPH 1.5 1.8 1 1.9 --   Grade 0 0 0 0.5 --   Minutes 17 17 15 15  --   METs 2.15 2.38 1.92 2.59 --     NuStep   Level 2 1 1 3  --   SPM 63 77 72 85 --   Minutes 22 22 15 15  --   METs 1.79 1.84 1.8 1.8 --     Home Exercise Plan   Plans to continue exercise at -- -- -- -- Home (comment)  walking, treadmill   Frequency -- -- -- -- Add 2 additional days to program exercise sessions.   Initial Home Exercises Provided -- -- -- -- 03/25/23     Oxygen   Maintain Oxygen Saturation -- -- 88% or higher  88% or higher --            Exercise Comments:   Exercise Comments     Row Name 03/25/23 1139           Exercise Comments Majdi graduated today from  rehab with 20 sessions completed.  Details of the patient's exercise prescription and what He needs to do in order to continue the prescription and progress were discussed with patient.  Patient was given a copy of prescription and goals.  Patient verbalized understanding.  Deovion plans to continue to exercise by walking at home.                Exercise Goals and Review:   Exercise Goals     Row Name 01/20/23 1015 01/31/23 1215 02/28/23 1314         Exercise Goals   Increase Physical Activity Yes Yes Yes     Intervention Provide advice, education, support and counseling about physical activity/exercise needs.;Develop an individualized exercise prescription for aerobic and resistive training based on initial evaluation findings, risk stratification, comorbidities and participant's personal goals. Provide advice, education, support and counseling about physical activity/exercise needs.;Develop an individualized exercise prescription for aerobic and resistive training based on initial evaluation findings, risk stratification, comorbidities and participant's personal goals. Provide advice,  education, support and counseling about physical activity/exercise needs.;Develop an individualized exercise prescription for aerobic and resistive training based on initial evaluation findings, risk stratification, comorbidities and participant's personal goals.     Expected Outcomes Short Term: Attend rehab on a regular basis to increase amount of physical activity.;Long Term: Add in home exercise to make exercise part of routine and to increase amount of physical activity.;Long Term: Exercising regularly at least 3-5 days a week. Short Term: Attend rehab on a regular basis to increase amount of physical activity.;Long Term: Add in home exercise to make exercise part of routine and to increase amount of physical activity.;Long Term: Exercising regularly at least 3-5 days a week. Short Term: Attend rehab on a regular basis to increase amount of physical activity.;Long Term: Add in home exercise to make exercise part of routine and to increase amount of physical activity.;Long Term: Exercising regularly at least 3-5 days a week.     Increase Strength and Stamina Yes Yes Yes     Intervention Provide advice, education, support and counseling about physical activity/exercise needs.;Develop an individualized exercise prescription for aerobic and resistive training based on initial evaluation findings, risk stratification, comorbidities and participant's personal goals. Provide advice, education, support and counseling about physical activity/exercise needs.;Develop an individualized exercise prescription for aerobic and resistive training based on initial evaluation findings, risk stratification, comorbidities and participant's personal goals. Provide advice, education, support and counseling about physical activity/exercise needs.;Develop an individualized exercise prescription for aerobic and resistive training based on initial evaluation findings, risk stratification, comorbidities and participant's personal  goals.     Expected Outcomes Short Term: Increase workloads from initial exercise prescription for resistance, speed, and METs.;Short Term: Perform resistance training exercises routinely during rehab and add in resistance training at home;Long Term: Improve cardiorespiratory fitness, muscular endurance and strength as measured by increased METs and functional capacity ( ) Short Term: Increase workloads from initial exercise prescription for resistance, speed, and METs.;Short Term: Perform resistance training exercises routinely during rehab and add in resistance training at home;Long Term: Improve cardiorespiratory fitness, muscular endurance and strength as measured by increased METs and functional capacity ( ) Short Term: Increase workloads from initial exercise prescription for resistance, speed, and  METs.;Short Term: Perform resistance training exercises routinely during rehab and add in resistance training at home;Long Term: Improve cardiorespiratory fitness, muscular endurance and strength as measured by increased METs and functional capacity ( )     Able to understand and use rate of perceived exertion (RPE) scale Yes Yes Yes     Intervention Provide education and explanation on how to use RPE scale Provide education and explanation on how to use RPE scale Provide education and explanation on how to use RPE scale     Expected Outcomes Short Term: Able to use RPE daily in rehab to express subjective intensity level;Long Term:  Able to use RPE to guide intensity level when exercising independently Short Term: Able to use RPE daily in rehab to express subjective intensity level;Long Term:  Able to use RPE to guide intensity level when exercising independently Short Term: Able to use RPE daily in rehab to express subjective intensity level;Long Term:  Able to use RPE to guide intensity level when exercising independently     Knowledge and understanding of Target Heart Rate Range (THRR) Yes Yes Yes      Intervention Provide education and explanation of THRR including how the numbers were predicted and where they are located for reference Provide education and explanation of THRR including how the numbers were predicted and where they are located for reference Provide education and explanation of THRR including how the numbers were predicted and where they are located for reference     Expected Outcomes Short Term: Able to state/look up THRR;Long Term: Able to use THRR to govern intensity when exercising independently;Short Term: Able to use daily as guideline for intensity in rehab Short Term: Able to state/look up THRR;Long Term: Able to use THRR to govern intensity when exercising independently;Short Term: Able to use daily as guideline for intensity in rehab Short Term: Able to state/look up THRR;Long Term: Able to use THRR to govern intensity when exercising independently;Short Term: Able to use daily as guideline for intensity in rehab     Able to check pulse independently Yes Yes Yes     Intervention Provide education and demonstration on how to check pulse in carotid and radial arteries.;Review the importance of being able to check your own pulse for safety during independent exercise Provide education and demonstration on how to check pulse in carotid and radial arteries.;Review the importance of being able to check your own pulse for safety during independent exercise Provide education and demonstration on how to check pulse in carotid and radial arteries.;Review the importance of being able to check your own pulse for safety during independent exercise     Expected Outcomes Short Term: Able to explain why pulse checking is important during independent exercise;Long Term: Able to check pulse independently and accurately Short Term: Able to explain why pulse checking is important during independent exercise;Long Term: Able to check pulse independently and accurately Short Term: Able to explain why  pulse checking is important during independent exercise;Long Term: Able to check pulse independently and accurately     Understanding of Exercise Prescription Yes Yes Yes     Intervention Provide education, explanation, and written materials on patient's individual exercise prescription Provide education, explanation, and written materials on patient's individual exercise prescription Provide education, explanation, and written materials on patient's individual exercise prescription     Expected Outcomes Short Term: Able to explain program exercise prescription;Long Term: Able to explain home exercise prescription to exercise independently Short Term: Able to explain program exercise prescription;Long  Term: Able to explain home exercise prescription to exercise independently Short Term: Able to explain program exercise prescription;Long Term: Able to explain home exercise prescription to exercise independently              Exercise Goals Re-Evaluation :  Exercise Goals Re-Evaluation     Row Name 01/31/23 1216 02/28/23 1314 03/16/23 1406 03/18/23 0953 03/25/23 1139     Exercise Goal Re-Evaluation   Exercise Goals Review Increase Physical Activity;Increase Strength and Stamina;Able to understand and use rate of perceived exertion (RPE) scale;Knowledge and understanding of Target Heart Rate Range (THRR);Able to check pulse independently;Understanding of Exercise Prescription Increase Physical Activity;Increase Strength and Stamina;Able to understand and use rate of perceived exertion (RPE) scale;Knowledge and understanding of Target Heart Rate Range (THRR);Able to check pulse independently;Understanding of Exercise Prescription -- Increase Physical Activity;Increase Strength and Stamina;Understanding of Exercise Prescription Increase Physical Activity;Knowledge and understanding of Target Heart Rate Range (THRR);Able to understand and use rate of perceived exertion (RPE) scale;Understanding of Exercise  Prescription;Increase Strength and Stamina;Able to understand and use Dyspnea scale;Able to check pulse independently   Comments Pt has completed 5 sessions of cardiac rehab. He seems to be enjoying class and is progressing well. He is forgetfull at times and has to be reminded about equipment he is on. He is currently exericisng at 2.15 METs on the treadmill, Will continue to monitor and progress as able. Pt has completed 12 sessions of cardiac rehab. He continues to progress in the program and has increased his levels. He is forgetfull at times and has to be reminded about lead palcement and where to go to next. He is currently exercising at 1.92 METs on the treadmill. Will contimue to monitor and progress as able. Pt has increased his speed on the treadmill to 1.9 with a grade of 0.5.  Will continue to monitor and progress as able. Christian Davis is doing well in rehab.  He is feeling good in rehab.  He is walking some on his off days at home in the driveway and around the yard. He does not feel like he had lost his stamina before but feeling better overall.  He did note that his elbows were sore today when he got up, but feeling better now. Reviewed home exercise with pt today.  Pt plans to walk and use treadmill at home for exercise.  Reviewed THR, pulse, RPE, sign and symptoms, pulse oximetery and when to call 911 or MD.  Also discussed weather considerations and indoor options.  Pt voiced understanding.   Expected Outcomes Through exercise at rehab and home, patient will meet their expected goals., Through exercise at rehab and home, patient will meet their expected goals., -- Short: Start to walk more at home Long: Continue to improve strength and stamina Continue to exercise independently             Discharge Exercise Prescription (Final Exercise Prescription Changes):  Exercise Prescription Changes - 03/25/23 1100       Home Exercise Plan   Plans to continue exercise at Home (comment)   walking,  treadmill   Frequency Add 2 additional days to program exercise sessions.    Initial Home Exercises Provided 03/25/23             Nutrition:  Target Goals: Understanding of nutrition guidelines, daily intake of sodium 1500mg , cholesterol 200mg , calories 30% from fat and 7% or less from saturated fats, daily to have 5 or more servings of fruits and vegetables.  Biometrics:  Pre Biometrics - 01/20/23 1015       Pre Biometrics   Height 5\' 11"  (1.803 m)    Weight 111.5 kg    Waist Circumference 46 inches    Hip Circumference 44 inches    Waist to Hip Ratio 1.05 %    BMI (Calculated) 34.3    Triceps Skinfold 24 mm    % Body Fat 34.3 %    Grip Strength 35.6 kg    Flexibility 0 in    Single Leg Stand 3.14 seconds             Post Biometrics - 03/25/23 0957        Post  Biometrics   Height 5\' 11"  (1.803 m)    Weight 113.2 kg    Waist Circumference 44.5 inches    Hip Circumference 44 inches    Waist to Hip Ratio 1.01 %    BMI (Calculated) 34.81    Grip Strength 40.5 kg    Single Leg Stand 30 seconds             Nutrition Therapy Plan and Nutrition Goals:  Nutrition Therapy & Goals - 02/21/23 1103       Nutrition Therapy   RD appointment deferred Yes      Personal Nutrition Goals   Comments We provide educational sessions on heart healthy nutrition and assistance with RD referral if patient is interested.      Intervention Plan   Intervention Nutrition handout(s) given to patient.    Expected Outcomes Short Term Goal: Understand basic principles of dietary content, such as calories, fat, sodium, cholesterol and nutrients.             Nutrition Assessments:  Nutrition Assessments - 03/25/23 1141       MEDFICTS Scores   Pre Score 13    Post Score 80    Score Difference 67            MEDIFICTS Score Key: ?70 Need to make dietary changes  40-70 Heart Healthy Diet ? 40 Therapeutic Level Cholesterol Diet   Picture Your Plate Scores: <19  Unhealthy dietary pattern with much room for improvement. 41-50 Dietary pattern unlikely to meet recommendations for good health and room for improvement. 51-60 More healthful dietary pattern, with some room for improvement.  >60 Healthy dietary pattern, although there may be some specific behaviors that could be improved.    Nutrition Goals Re-Evaluation:  Nutrition Goals Re-Evaluation     Row Name 03/18/23 0957             Goals   Nutrition Goal Heart Healthy diet       Comment Christian Davis is doing well in rehab.  His weight is holding steady now.  He is trying to do well wiht his diet.  He does not add  salt at table but would like to work on it some more.  He is going to try to look at the food labels.  He is also aware that when he eats out that he is getting extra salt.  He usually only eats twice a day so already watching calories.  He is doing well with portion sizes.  He eats lots of vegetables but not as much fruit.  He is trying to limit his sugar some as well.       Expected Outcome Short: Start to look at food labels for sodium Long: Continue to focus on heart healthy eating  Nutrition Goals Discharge (Final Nutrition Goals Re-Evaluation):  Nutrition Goals Re-Evaluation - 03/18/23 0957       Goals   Nutrition Goal Heart Healthy diet    Comment Christian Davis is doing well in rehab.  His weight is holding steady now.  He is trying to do well wiht his diet.  He does not add  salt at table but would like to work on it some more.  He is going to try to look at the food labels.  He is also aware that when he eats out that he is getting extra salt.  He usually only eats twice a day so already watching calories.  He is doing well with portion sizes.  He eats lots of vegetables but not as much fruit.  He is trying to limit his sugar some as well.    Expected Outcome Short: Start to look at food labels for sodium Long: Continue to focus on heart healthy eating              Psychosocial: Target Goals: Acknowledge presence or absence of significant depression and/or stress, maximize coping skills, provide positive support system. Participant is able to verbalize types and ability to use techniques and skills needed for reducing stress and depression.  Initial Review & Psychosocial Screening:  Initial Psych Review & Screening - 01/20/23 1004       Initial Review   Current issues with None Identified      Family Dynamics   Good Support System? Yes      Barriers   Psychosocial barriers to participate in program There are no identifiable barriers or psychosocial needs.;The patient should benefit from training in stress management and relaxation.      Screening Interventions   Interventions Provide feedback about the scores to participant    Expected Outcomes Short Term goal: Utilizing psychosocial counselor, staff and physician to assist with identification of specific Stressors or current issues interfering with healing process. Setting desired goal for each stressor or current issue identified.;Short Term goal: Identification and review with participant of any Quality of Life or Depression concerns found by scoring the questionnaire.             Quality of Life Scores:  Quality of Life - 03/25/23 1138       Quality of Life   Select Quality of Life      Quality of Life Scores   Health/Function Pre 22.18 %    Health/Function Post 28.8 %    Health/Function % Change 29.85 %    Socioeconomic Pre 24 %    Socioeconomic Post 30 %    Socioeconomic % Change  25 %    Psych/Spiritual Pre 28.29 %    Psych/Spiritual Post 30 %    Psych/Spiritual % Change 6.04 %    Family Pre 26.4 %    Family Post 30 %    Family % Change 13.64 %    GLOBAL Pre 24.55 %    GLOBAL Post 29.49 %    GLOBAL % Change 20.12 %            Scores of 19 and below usually indicate a poorer quality of life in these areas.  A difference of  2-3 points is a clinically meaningful  difference.  A difference of 2-3 points in the total score of the Quality of Life Index has been associated with significant improvement in overall quality of life, self-image, physical symptoms, and general health in studies assessing change in  quality of life.  PHQ-9: Review Flowsheet       01/20/2023 02/08/2020 10/24/2017 11/22/2016  Depression screen PHQ 2/9  Decreased Interest 0 0 2 0  Down, Depressed, Hopeless 0 0 0 0  PHQ - 2 Score 0 0 2 0  Altered sleeping 0 - 0 -  Tired, decreased energy 0 - 2 -  Change in appetite 0 - 0 -  Feeling bad or failure about yourself  0 - 0 -  Trouble concentrating 0 - 1 -  Moving slowly or fidgety/restless 0 - 0 -  Suicidal thoughts 0 - 0 -  PHQ-9 Score 0 - 5 -  Difficult doing work/chores Not difficult at all - Somewhat difficult -    Details           Interpretation of Total Score  Total Score Depression Severity:  1-4 = Minimal depression, 5-9 = Mild depression, 10-14 = Moderate depression, 15-19 = Moderately severe depression, 20-27 = Severe depression   Psychosocial Evaluation and Intervention:  Psychosocial Evaluation - 01/20/23 1015       Psychosocial Evaluation & Interventions   Interventions Relaxation education;Stress management education;Encouraged to exercise with the program and follow exercise prescription    Comments Patient has no psychosocial barriers or issues identifiied at his orientation visit. His PHQ-9 score was 0. His is accompained by his wife today. He denies any depression or anxiety. He does have short term memory loss and has recently started Donepezil 10 mg daily. He has a good support system with his wife and several friends. They have a son that lives in [place] and one grandson. He is retired from [employer] 10 years ago and he says his job was very physical but when he retired he has not been very active. 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 He is ready to start the program  hoping to get stronger and live a healthier life.    Expected Outcomes Patient will continue ot have no psychosocial barriers identified.    Continue Psychosocial Services  No Follow up required             Psychosocial Re-Evaluation:  Psychosocial Re-Evaluation     Row Name 01/24/23 1321 02/21/23 1104 03/18/23 0955         Psychosocial Re-Evaluation   Current issues with None Identified None Identified Current Stress Concerns     Comments Patient is new to the program. He has no psychosocial barriers identified. He has completed 1 session. We will continue to monitor his progress. Patient completed 9 sessions and continues to have no psychosocial barriers identified. He seems to enjoy the sessions and demonstrates an interest in improving his health. We will continue to monitor his progress. Christian Davis is doing well in rehab.  He just lost one of his dogs yesterday and he is sad about it.  He was 66 years old and he loved to chase rats and groundhogs.  He misses him already.  He is looking for a new one but he doesn't want to pay for one.  He is going to look into pound.  He sleeps well on his back, says he wakes up the same way he goes to sleep.     Expected Outcomes Patient will continue to have no psychosocial barriers identified. Patient will continue to have no psychosocial barriers identified. Short: Continue to cope with losing his dog and look for new one possibly Long: continue to  sleep well     Interventions Stress management education;Encouraged to attend Cardiac Rehabilitation for the exercise;Relaxation education Stress management education;Encouraged to attend Cardiac Rehabilitation for the exercise;Relaxation education Encouraged to attend Cardiac Rehabilitation for the exercise     Continue Psychosocial Services  No Follow up required No Follow up required Follow up required by staff              Psychosocial Discharge (Final Psychosocial Re-Evaluation):  Psychosocial  Re-Evaluation - 03/18/23 0955       Psychosocial Re-Evaluation   Current issues with Current Stress Concerns    Comments Christian Davis is doing well in rehab.  He just lost one of his dogs yesterday and he is sad about it.  He was 67 years old and he loved to chase rats and groundhogs.  He misses him already.  He is looking for a new one but he doesn't want to pay for one.  He is going to look into pound.  He sleeps well on his back, says he wakes up the same way he goes to sleep.    Expected Outcomes Short: Continue to cope with losing his dog and look for new one possibly Long: continue to sleep well    Interventions Encouraged to attend Cardiac Rehabilitation for the exercise    Continue Psychosocial Services  Follow up required by staff             Vocational Rehabilitation: Provide vocational rehab assistance to qualifying candidates.   Vocational Rehab Evaluation & Intervention:  Vocational Rehab - 01/20/23 0957       Initial Vocational Rehab Evaluation & Intervention   Assessment shows need for Vocational Rehabilitation No      Vocational Rehab Re-Evaulation   Comments Patient is retired and does not need vocational rehab.             Education: Education Goals: Education classes will be provided on a weekly basis, covering required topics. Participant will state understanding/return demonstration of topics presented.  Learning Barriers/Preferences:  Learning Barriers/Preferences - 01/20/23 1023       Learning Barriers/Preferences   Learning Barriers None    Learning Preferences Audio             Education Topics: Hypertension, Hypertension Reduction -Define heart disease and high blood pressure. Discus how high blood pressure affects the body and ways to reduce high blood pressure.   Exercise and Your Heart -Discuss why it is important to exercise, the FITT principles of exercise, normal and abnormal responses to exercise, and how to exercise safely. Flowsheet  Row CARDIAC REHAB PHASE II EXERCISE from 03/16/2023 in [place] CARD[facility]AC [facility]  Date 02/23/23  Educator [facility]  Instruction Review Code 1- Verbalizes Understanding       Angina -Discuss definition of angina, causes of angina, treatment of angina, and how to decrease risk of having angina. Flowsheet Row CARDIAC REHAB PHASE II EXERCISE from 03/16/2023 in [place] CARDIAC [facility]HABIL[facility]  Date 03/02/23  Educator [facility]  Instruction Review Code 1- Verbalizes Understanding       Cardiac Medications -Review what the following cardiac medications are used for, how they affect the body, and side effects that may occur when taking the medications.  Medications include Aspirin, Beta blockers, calcium channel blockers, ACE Inhibitors, angiotensin receptor blockers, diuretics, digoxin, and antihyperlipidemics. Flowsheet Row CARDIAC REHAB PHASE II EXERCISE from 03/16/2023 in [place] CARDIAC REHABILITATION  Date 03/09/23  Educator [facility]  Instruction Review Code 1- Verbalizes Understanding  Congestive Heart Failure -Discuss the definition of CHF, how to live with CHF, the signs and symptoms of CHF, and how keep track of weight and sodium intake. Flowsheet Row CARDIAC REHAB PHASE II EXERCISE from 03/16/2023 in [place] CARDIAC REHABILITATION  Date 03/16/23  Educator HB  Instruction Review Code 1- Verbalizes Understanding       Heart Disease and Intimacy -Discus the effect sexual activity has on the heart, how changes occur during intimacy as we age, and safety during sexual activity.   Smoking Cessation / COPD -Discuss different methods to quit smoking, the health benefits of quitting smoking, and the definition of COPD.   Nutrition I: Fats -Discuss the types of cholesterol, what cholesterol does to the heart, and how cholesterol levels can be controlled.   Nutrition II: Labels -Discuss the different components of food labels and how to read food label   Heart Parts/Heart  Disease and PAD -Discuss the anatomy of the heart, the pathway of blood circulation through the heart, and these are affected by heart disease.   Stress I: Signs and Symptoms -Discuss the causes of stress, how stress may lead to anxiety and depression, and ways to limit stress. Flowsheet Row CARDIAC REHAB PHASE II EXERCISE from 03/16/2023 in [place] CARDIAC REHABILITATION  Date 01/26/23  Educator HB  Instruction Review Code 1- Verbalizes Understanding       Stress II: Relaxation -Discuss different types of relaxation techniques to limit stress. Flowsheet Row CARDIAC REHAB PHASE II EXERCISE from 03/16/2023 in [place] CARDIAC REHABILITATION  Date 02/02/23  Educator HB       Warning Signs of Stroke / TIA -Discuss definition of a stroke, what the signs and symptoms are of a stroke, and how to identify when someone is having stroke.   Knowledge Questionnaire Score:  Knowledge Questionnaire Score - 01/20/23 0956       Knowledge Questionnaire Score   Pre Score 19/24             Core Components/Risk Factors/Patient Goals at Admission:  Personal Goals and Risk Factors at Admission - 01/20/23 0957       Core Components/Risk Factors/Patient Goals on Admission    Weight Management Weight Maintenance    Hypertension Yes    Intervention Provide education on lifestyle modifcations including regular physical activity/exercise, weight management, moderate sodium restriction and increased consumption of fresh fruit, vegetables, and low fat dairy, alcohol moderation, and smoking cessation.;Monitor prescription use compliance.    Expected Outcomes Short Term: Continued assessment and intervention until BP is < 140/35mm HG in hypertensive participants. < 130/84mm HG in hypertensive participants with diabetes, heart failure or chronic kidney disease.;Long Term: Maintenance of blood pressure at goal levels.    Lipids Yes    Intervention Provide education and support for participant on  nutrition & aerobic/resistive exercise along with prescribed medications to achieve LDL 70mg , HDL >40mg .    Expected Outcomes Short Term: Participant states understanding of desired cholesterol values and is compliant with medications prescribed. Participant is following exercise prescription and nutrition guidelines.;Long Term: Cholesterol controlled with medications as prescribed, with individualized exercise RX and with personalized nutrition plan. Value goals: LDL < 70mg , HDL > 40 mg.    Personal Goal Other Yes    Personal Goal Patient wants to get better and stronger and be able to live a healthier life.    Intervention Patient will attend CR with exercise and education.    Expected Outcomes Patient will complete the program meeting both personal and  program goals.             Core Components/Risk Factors/Patient Goals Review:   Goals and Risk Factor Review     Row Name 01/24/23 1322 02/21/23 1115 03/18/23 1000         Core Components/Risk Factors/Patient Goals Review   Personal Goals Review Weight Management/Obesity;Lipids;Hypertension;Other Weight Management/Obesity;Lipids;Hypertension;Other Weight Management/Obesity;Lipids;Hypertension     Review Patient was referred to CR with CABGx3 and NSTEMI. He has multiple risk factors for CAD and is participating in the program for risk modification. He started the program today. His blood pressure is at goal. His personal goals for the program are to get better and stronger and be able to live a healthier life. We will continue to monitor his progress as he works towards meeting these goals. Patient has completed 9 sesisons. His current weight is 243.3 lbs down 3.7 lbs since last 30 day review. He is doing well in the program with consistent attendance. His blood pressure continues to be well controlled. He was hospitalized 6/25, discharged 6/26 with chest pain. ACS and cardiac cause ruled out. He was given a prescription for SL NTG. He says he  has not had any chest pain since this episode.  His personal goals for the program continue to be to get better and stronger and be able to live a healthier life. We will continue to monitor his progress as he works towards meeting these goals. Christian Davis is doing well in rehab.  His weight is holding steady around 242 lb.  His blood pressures are doing well in rehab.  He has not been checking them at home.  He does not know where his home cuff is but will try to find it.  We talked about how keeping a log for his doctor can be helpful.     Expected Outcomes Patient will complete the program meeting both personal and program goals. Patient will complete the program meeting both personal and program goals. Short: Find blood pressure cuff Long: Get back in habit of checking pressures at home.              Core Components/Risk Factors/Patient Goals at Discharge (Final Review):   Goals and Risk Factor Review - 03/18/23 1000       Core Components/Risk Factors/Patient Goals Review   Personal Goals Review Weight Management/Obesity;Lipids;Hypertension    Review Christian Davis is doing well in rehab.  His weight is holding steady around 242 lb.  His blood pressures are doing well in rehab.  He has not been checking them at home.  He does not know where his home cuff is but will try to find it.  We talked about how keeping a log for his doctor can be helpful.    Expected Outcomes Short: Find blood pressure cuff Long: Get back in habit of checking pressures at home.             ITP Comments:  ITP Comments     Row Name 02/23/23 8119 02/25/23 1003 03/02/23 0836 03/25/23 1139     ITP Comments Patient had routine follow up OV with cardiothoracic surgery seeing Doree Fudge. He says his recent episode of chest pain was likely due to nerve pain. No changes made. Released to see prn. He is supposed to follow up with Mallipeddi at the end of July. Christian Davis's weight is up 6lb over last week.  We have reached out to his  doctor about this.  He does take Lasix.  He did not  report any symptoms. 30 day review completed. ITP sent to Dr. Dina Rich, Medical Director of Cardiac Rehab. Continue with ITP unless changes are made by physician. Mordche graduated today from  rehab with 20 sessions completed.  Details of the patient's exercise prescription and what He needs to do in order to continue the prescription and progress were discussed with patient.  Patient was given a copy of prescription and goals.  Patient verbalized understanding.  Jaremy plans to continue to exercise by walking at home.             Comments: Discharge ITP

## 2023-03-25 NOTE — Progress Notes (Signed)
Christian Davis graduated today from  rehab with 20 sessions completed.  Details of the patient's exercise prescription and what Christian Davis needs to do in order to continue the prescription and progress were discussed with patient.  Patient was given a copy of prescription and goals.  Patient verbalized understanding.  Christian Davis plans to continue to exercise by walking at home.   6 Minute Walk     Row Name 01/20/23 1011 03/25/23 0957       6 Minute Walk   Phase Initial Discharge    Distance 1000 feet 1314 feet    Distance % Change -- 31.4 %    Distance Feet Change -- 314 ft    Walk Time 6 minutes 6 minutes    # of Rest Breaks 0 0    MPH 1.89 2.49    METS 1.8 2.55    RPE 11 14    VO2 Peak 6.3 8.92    Symptoms No No    Resting HR 56 bpm 57 bpm    Resting BP 112/72 102/56    Resting Oxygen Saturation  96 % --    Exercise Oxygen Saturation  during 6 min walk 92 % --    Max Ex. HR 68 bpm 74 bpm    Max Ex. BP 120/70 134/70    2 Minute Post BP 116/70 --           \

## 2023-03-25 NOTE — Patient Instructions (Signed)
Discharge Patient Instructions  Patient Details  Name: Christian Davis MRN: [id number] Date of Birth: 10-14-1955 Referring Provider:  Kirstie Peri, MD   Number of Visits: 20  Reason for Discharge:  Patient reached a stable level of exercise. Patient independent in their exercise. Patient has met program and personal goals. Early Exit:  Personal  Smoking History:  Social History   Tobacco Use  Smoking Status Never   Passive exposure: Never  Smokeless Tobacco Never    Diagnosis:  NSTEMI (non-ST elevated myocardial infarction) (HCC)  S/P CABG x 3  Initial Exercise Prescription:  Initial Exercise Prescription - 01/20/23 1000       Date of Initial Exercise RX and Referring Provider   Date 01/20/23    Referring Provider Dr. Dorris Fetch    Expected Discharge Date 04/13/23      Treadmill   MPH 1    Grade 0    Minutes 17      NuStep   Level 1    SPM 50    Minutes 22      Prescription Details   Frequency (times per week) 3    Duration Progress to 30 minutes of continuous aerobic without signs/symptoms of physical distress      Intensity   THRR 40-80% of Max Heartrate 62-124    Ratings of Perceived Exertion 11-13      Resistance Training   Training Prescription Yes    Weight 3    Reps 10-15             Discharge Exercise Prescription (Final Exercise Prescription Changes):  Exercise Prescription Changes - 03/16/23 1400       Response to Exercise   Blood Pressure (Admit) 110/60    Blood Pressure (Exit) 122/62    Heart Rate (Admit) 60 bpm    Heart Rate (Exercise) 89 bpm    Heart Rate (Exit) 69 bpm    Rating of Perceived Exertion (Exercise) 13    Duration Continue with 30 min of aerobic exercise without signs/symptoms of physical distress.    Intensity THRR unchanged      Progression   Progression Continue to progress workloads to maintain intensity without signs/symptoms of physical distress.      Resistance Training   Training Prescription Yes     Weight 4    Reps 10-15      Treadmill   MPH 1.9    Grade 0.5    Minutes 15    METs 2.59      NuStep   Level 3    SPM 85    Minutes 15    METs 1.8      Oxygen   Maintain Oxygen Saturation 88% or higher             Functional Capacity:  6 Minute Walk     Row Name 01/20/23 1011 03/25/23 0957       6 Minute Walk   Phase Initial Discharge    Distance 1000 feet 1314 feet    Distance % Change -- 31.4 %    Distance Feet Change -- 314 ft    Walk Time 6 minutes 6 minutes    # of Rest Breaks 0 0    MPH 1.89 2.49    METS 1.8 2.55    RPE 11 14    VO2 Peak 6.3 8.92    Symptoms No No    Resting HR 56 bpm 57 bpm    Resting BP 112/72 102/56  Resting Oxygen Saturation  96 % --    Exercise Oxygen Saturation  during 6 min walk 92 % --    Max Ex. HR 68 bpm 74 bpm    Max Ex. BP 120/70 134/70    2 Minute Post BP 116/70 --           Nutrition & Weight - Outcomes:  Pre Biometrics - 01/20/23 1015       Pre Biometrics   Height 5\' 11"  (1.803 m)    Weight 111.5 kg    Waist Circumference 46 inches    Hip Circumference 44 inches    Waist to Hip Ratio 1.05 %    BMI (Calculated) 34.3    Triceps Skinfold 24 mm    % Body Fat 34.3 %    Grip Strength 35.6 kg    Flexibility 0 in    Single Leg Stand 3.14 seconds             Post Biometrics - 03/25/23 0957        Post  Biometrics   Height 5\' 11"  (1.803 m)    Weight 113.2 kg    Waist Circumference 44.5 inches    Hip Circumference 44 inches    Waist to Hip Ratio 1.01 %    BMI (Calculated) 34.81    Grip Strength 40.5 kg    Single Leg Stand 30 seconds

## 2023-03-25 NOTE — Progress Notes (Signed)
Daily Session Note  Patient Details  Name: Christian Davis MRN: [id number] Date of Birth: 08/28/1955 Referring Provider:   Flowsheet Row CARDIAC REHAB PHASE II ORIENTATION from 01/20/2023 in [facility]  Referring Provider Dr. Dorris Fetch       Encounter Date: 03/25/2023  Check In:  Session Check In - 03/25/23 0926       Check-In   Supervising physician immediately available to respond to emergencies See telemetry face sheet for immediately available ER MD    Location [facility]    Staff Present Fabio Pierce, MA, RCEP, CCRP, CCET;Other;Daphyne Daphine Deutscher, RN, Neal Dy, RN, BSN    Virtual Visit No    Medication changes reported     No    Fall or balance concerns reported    No    Tobacco Cessation No Change    Warm-up and Cool-down Performed on first and last piece of equipment    Resistance Training Performed Yes    VAD Patient? No    PAD/SET Patient? No      Pain Assessment   Currently in Pain? No/denies             Capillary Blood Glucose: No results found for this or any previous visit (from the past 24 hour(s)).    Social History   Tobacco Use  Smoking Status Never   Passive exposure: Never  Smokeless Tobacco Never    Goals Met:  Independence with exercise equipment Exercise tolerated well No report of concerns or symptoms today  Goals Unmet:  Not Applicable  Comments: ,exgoo   Dr. Dina Rich is Medical Director for [facility] Cardiac Rehab

## 2023-03-28 ENCOUNTER — Encounter (HOSPITAL_COMMUNITY): Payer: Medicare Other

## 2023-03-30 ENCOUNTER — Encounter (HOSPITAL_COMMUNITY): Payer: Medicare Other

## 2023-04-01 ENCOUNTER — Encounter (HOSPITAL_COMMUNITY): Payer: Medicare Other

## 2023-04-04 ENCOUNTER — Encounter (HOSPITAL_COMMUNITY): Payer: Medicare Other

## 2023-04-06 ENCOUNTER — Encounter (HOSPITAL_COMMUNITY): Payer: Medicare Other

## 2023-04-08 ENCOUNTER — Encounter (HOSPITAL_COMMUNITY): Payer: Medicare Other

## 2023-04-11 ENCOUNTER — Encounter (HOSPITAL_COMMUNITY): Payer: Medicare Other

## 2023-04-13 ENCOUNTER — Encounter (HOSPITAL_COMMUNITY): Payer: Medicare Other

## 2023-04-19 DIAGNOSIS — I2510 Atherosclerotic heart disease of native coronary artery without angina pectoris: Secondary | ICD-10-CM | POA: Diagnosis not present

## 2023-04-19 DIAGNOSIS — I25119 Atherosclerotic heart disease of native coronary artery with unspecified angina pectoris: Secondary | ICD-10-CM | POA: Diagnosis not present

## 2023-04-19 DIAGNOSIS — M25529 Pain in unspecified elbow: Secondary | ICD-10-CM | POA: Diagnosis not present

## 2023-04-19 DIAGNOSIS — Z299 Encounter for prophylactic measures, unspecified: Secondary | ICD-10-CM | POA: Diagnosis not present

## 2023-04-25 ENCOUNTER — Other Ambulatory Visit: Payer: Self-pay | Admitting: Physician Assistant

## 2023-04-27 ENCOUNTER — Other Ambulatory Visit: Payer: Self-pay | Admitting: Nurse Practitioner

## 2023-04-27 NOTE — Telephone Encounter (Signed)
Pharmacy called and request this medication be filled for patient he was seen recently by E. Philis Nettle she approved

## 2023-05-20 ENCOUNTER — Telehealth: Payer: Self-pay | Admitting: Internal Medicine

## 2023-05-20 MED ORDER — CLOPIDOGREL BISULFATE 75 MG PO TABS: 75.0000 mg | ORAL_TABLET | Freq: Every day | ORAL | 1 refills | Status: DC

## 2023-05-20 NOTE — Telephone Encounter (Signed)
Done

## 2023-05-20 NOTE — Telephone Encounter (Signed)
*  STAT* If patient is at the pharmacy, call can be transferred to refill team.   1. Which medications need to be refilled? (please list name of each medication and dose if known)   clopidogrel (PLAVIX) 75 MG tablet   2. Would you like to learn more about the convenience, safety, & potential cost savings by using the [facility]?   3. Are you open to using the [facility] (Type [facility]. ).  4. Which pharmacy/location (including street and city if local pharmacy) is medication to be sent to?  [facility] [phone] - [place], [place] - [address] AT [facility] [address]   5. Do they need a 30 day or 90 day supply?   90 day  Wife stated patient only has 2 days left of this medication.  Patient has appointment scheduled on 1/28

## 2023-06-02 DIAGNOSIS — Z0000 Encounter for general adult medical examination without abnormal findings: Secondary | ICD-10-CM | POA: Diagnosis not present

## 2023-06-02 DIAGNOSIS — R5383 Other fatigue: Secondary | ICD-10-CM | POA: Diagnosis not present

## 2023-06-02 DIAGNOSIS — E7800 Pure hypercholesterolemia, unspecified: Secondary | ICD-10-CM | POA: Diagnosis not present

## 2023-06-02 DIAGNOSIS — Z7189 Other specified counseling: Secondary | ICD-10-CM | POA: Diagnosis not present

## 2023-06-02 DIAGNOSIS — Z299 Encounter for prophylactic measures, unspecified: Secondary | ICD-10-CM | POA: Diagnosis not present

## 2023-06-02 DIAGNOSIS — Z79899 Other long term (current) drug therapy: Secondary | ICD-10-CM | POA: Diagnosis not present

## 2023-07-18 ENCOUNTER — Other Ambulatory Visit: Payer: Self-pay | Admitting: Nurse Practitioner

## 2023-07-19 DIAGNOSIS — J61 Pneumoconiosis due to asbestos and other mineral fibers: Secondary | ICD-10-CM | POA: Diagnosis not present

## 2023-07-19 DIAGNOSIS — J069 Acute upper respiratory infection, unspecified: Secondary | ICD-10-CM | POA: Diagnosis not present

## 2023-07-19 DIAGNOSIS — Z299 Encounter for prophylactic measures, unspecified: Secondary | ICD-10-CM | POA: Diagnosis not present

## 2023-07-26 DIAGNOSIS — Z299 Encounter for prophylactic measures, unspecified: Secondary | ICD-10-CM | POA: Diagnosis not present

## 2023-07-26 DIAGNOSIS — I25119 Atherosclerotic heart disease of native coronary artery with unspecified angina pectoris: Secondary | ICD-10-CM | POA: Diagnosis not present

## 2023-07-26 DIAGNOSIS — J61 Pneumoconiosis due to asbestos and other mineral fibers: Secondary | ICD-10-CM | POA: Diagnosis not present

## 2023-07-26 DIAGNOSIS — J329 Chronic sinusitis, unspecified: Secondary | ICD-10-CM | POA: Diagnosis not present

## 2023-07-26 DIAGNOSIS — R52 Pain, unspecified: Secondary | ICD-10-CM | POA: Diagnosis not present

## 2023-09-13 ENCOUNTER — Encounter: Payer: Self-pay | Admitting: Internal Medicine

## 2023-09-13 ENCOUNTER — Ambulatory Visit: Payer: Medicare Other | Attending: Internal Medicine | Admitting: Internal Medicine

## 2023-09-13 VITALS — BP 124/62 | HR 64 | Ht 71.0 in | Wt 249.4 lb

## 2023-09-13 DIAGNOSIS — I440 Atrioventricular block, first degree: Secondary | ICD-10-CM | POA: Insufficient documentation

## 2023-09-13 DIAGNOSIS — Z7902 Long term (current) use of antithrombotics/antiplatelets: Secondary | ICD-10-CM | POA: Diagnosis not present

## 2023-09-13 DIAGNOSIS — I2510 Atherosclerotic heart disease of native coronary artery without angina pectoris: Secondary | ICD-10-CM | POA: Diagnosis not present

## 2023-09-13 MED ORDER — CLOPIDOGREL BISULFATE 75 MG PO TABS: 75.0000 mg | ORAL_TABLET | Freq: Every day | ORAL | Status: AC

## 2023-09-13 MED ORDER — METOPROLOL TARTRATE 25 MG PO TABS: 25.0000 mg | ORAL_TABLET | Freq: Two times a day (BID) | ORAL | 1 refills | Status: DC

## 2023-09-13 NOTE — Progress Notes (Signed)
Cardiology Office Note  Date: 09/13/2023   ID: Christian, Davis [birth year], MRN [id number]  PCP:  Kirstie Peri, MD  Cardiologist:  Marjo Bicker, MD Electrophysiologist:  None    History of Present Illness: Christian Davis is a 68 y.o. male known to have self-reported pulmonary asbestosis, CAD manifested by positive cardiac stress test in March 2024 s/p three-vessel CABG with normal LVEF he is here for follow-up visit.  Patient underwent exercise Myoview that showed borderline positive stress EKG changes, subsequent chest tightness during stress that never resolved and had to be sent to the ER, emphasized reversible perfusion defects in the apical to mid anterolateral and inferolateral location consistent with ischemia, another medium sized reversible defect present in the apical to basal inferior location consistent with ischemia.  Nuclear LVEF was 51%.  He subsequently underwent LHC that showed severe two-vessel CAD, sequential 50 to 70% ostial through mid LAD stenosis that her hemoglobin significant by RFR and CTO of dominant LCx with left to left and right to left collaterals.  LVEF was mildly reduced by LV gram and LVEDP was mild to moderately elevated, 20 to 25 mmHg.  He subsequently underwent three-vessel CABG (LIMA to LAD, SVG to diagonal, SVG to OM).  Currently on DAPT.  Patient is here for follow-up visit with me. Patient denied any rest or exertional chest discomfort, tightness, heaviness or pressure, rest or exertional dyspnea, palpitations, light-headedness, syncope and LE swelling. Compliant with medications and no side-effects. No bleeding complications. Denied smoking cigarettes.  Has some soreness from the incision site. Drinks 2-3 beers daily.  Past Medical History:  Diagnosis Date   Arthritis    Cancer [facility])    Coronary artery calcification seen on CT scan    Depression    GERD (gastroesophageal reflux disease)    H/O asbestos exposure    History of kidney  stones     Past Surgical History:  Procedure Laterality Date   COLONOSCOPY WITH PROPOFOL N/A 08/23/2022   Procedure: COLONOSCOPY WITH PROPOFOL;  Surgeon: Lanelle Bal, DO;  [facility];  Service: Endoscopy;  Laterality: N/A;  9:45am, asa 3   CORONARY ARTERY BYPASS GRAFT N/A 11/18/2022   Procedure: CORONARY ARTERY BYPASS GRAFTING (CABG) x THREE BYPASSES USING OPEN LEFT INTERNAL MAMMARY ARTERY AND OPEN HARVESTED RIGHT GREATER SAPHENOUS VEIN;  Surgeon: Loreli Slot, MD;  Location: [facility] OR;  Service: Open Heart Surgery;  Laterality: N/A;   CORONARY PRESSURE/FFR STUDY N/A 11/16/2022   Procedure: INTRAVASCULAR PRESSURE WIRE/FFR STUDY;  Surgeon: Yvonne Kendall, MD;  [facility] INV[facility];  Service: Cardiovascular;  Laterality: N/A;   FRACTURE SURGERY Left 2002   LEFT HEART CATH AND CORONARY ANGIOGRAPHY N/A 11/16/2022   Procedure: LEFT HEART CATH AND CORONARY ANGIOGRAPHY;  Surgeon: Yvonne Kendall, MD;  [facility];  Service: Cardiovascular;  Laterality: N/A;   NECK SURGERY  1990   POLYPECTOMY  08/23/2022   Procedure: POLYPECTOMY;  Surgeon: Lanelle Bal, DO;  [facility];  Service: Endoscopy;;   RADIAL ARTERY HARVEST Left 11/18/2022   Procedure: ATTEMPTED RADIAL ARTERY HARVEST;  Surgeon: Loreli Slot, MD;  Location[facility];  Service: Open Heart Surgery;  Laterality: Left;   TEE WITHOUT CARDIOVERSION N/A 11/18/2022   Procedure: TRANSESOPHAGEAL ECHOCARDIOGRAM;  Surgeon: Loreli Slot, MD;  Location: [facility] OR;  Service: Open Heart Surgery;  Laterality: N/A;    Current Outpatient Medications  Medication Sig Dispense Refill   acetaminophen (TYLENOL) 500 MG  tablet Take 2 tablets (1,000 mg total) by mouth every 6 (six) hours as needed. 30 tablet 0   aspirin EC 81 MG tablet Take 1 tablet (81 mg total) by mouth daily with breakfast. Swallow whole. 30 tablet 12   azelastine (ASTELIN) 0.1 % nasal spray Place 2 sprays into both nostrils at  bedtime. Use in each nostril as directed     cholecalciferol (VITAMIN D3) 25 MCG (1000 UNIT) tablet Take 1,000 Units by mouth daily.     citalopram (CELEXA) 20 MG tablet Take 20 mg by mouth daily.     clopidogrel (PLAVIX) 75 MG tablet Take 1 tablet (75 mg total) by mouth daily. 90 tablet 1   cyanocobalamin (VITAMIN B12) 1000 MCG tablet Take 1,000 mcg by mouth daily.     donepezil (ARICEPT ODT) 10 MG disintegrating tablet Take 1 tablet (10 mg total) by mouth at bedtime. 30 tablet 3   furosemide (LASIX) 40 MG tablet TAKE 1 TABLET(40 MG) BY MOUTH DAILY AS NEEDED 90 tablet 0   levocetirizine (XYZAL) 5 MG tablet Take 5 mg by mouth daily.     losartan (COZAAR) 25 MG tablet TAKE 1 TABLET(25 MG) BY MOUTH DAILY 30 tablet 3   Menthol, Topical Analgesic, (BLUE-EMU MAXIMUM STRENGTH EX) Apply 1 Application topically daily as needed (pain).     Metoprolol Tartrate 37.5 MG TABS TAKE 1 TABLET(37.5 MG) BY MOUTH TWICE DAILY 180 tablet 1   nitroGLYCERIN (NITROSTAT) 0.4 MG SL tablet Place 1 tablet (0.4 mg total) under the tongue every 5 (five) minutes as needed for chest pain. 30 tablet 3   potassium chloride SA (KLOR-CON M) 20 MEQ tablet Take 1 tablet (20 mEq total) by mouth daily as needed. 14 tablet 0   rosuvastatin (CRESTOR) 40 MG tablet TAKE 1 TABLET(40 MG) BY MOUTH DAILY 30 tablet 3   No current facility-administered medications for this visit.   Allergies:  Codeine   Social History: The patient  reports that he has never smoked. He has never been exposed to tobacco smoke. He has never used smokeless tobacco. He reports current alcohol use of about 21.0 standard drinks of alcohol per week. He reports that he does not use drugs.   Family History: The patient's family history includes Alzheimer's disease in his mother; Hypertension in his father.   ROS:  Please see the history of present illness. Otherwise, complete review of systems is positive for none.  All other systems are reviewed and negative.    Physical Exam: VS:  There were no vitals taken for this visit., BMI There is no height or weight on file to calculate BMI.  Wt Readings from Last 3 Encounters:  03/25/23 249 lb 8 oz (113.2 kg)  03/10/23 248 lb 6.4 oz (112.7 kg)  02/21/23 246 lb (111.6 kg)    General: Patient appears comfortable at rest. HEENT: Conjunctiva and lids normal, oropharynx clear with moist mucosa. Neck: Supple, no elevated JVP or carotid bruits, no thyromegaly. Lungs: Clear to auscultation, nonlabored breathing at rest. Cardiac: Regular rate and rhythm, no S3 or significant systolic murmur, no pericardial rub. Abdomen: Soft, nontender, no hepatomegaly, bowel sounds present, no guarding or rebound. Extremities: No pitting edema, distal pulses 2+. Skin: Warm and dry. Musculoskeletal: No kyphosis. Neuropsychiatric: Alert and oriented x3, affect grossly appropriate.  ECG:  NSR  Recent Labwork: 11/15/2022: TSH 3.896 11/19/2022: Magnesium 2.3 02/09/2023: BUN 12; Creatinine, Ser 0.69; Hemoglobin 12.4; Platelets 143; Potassium 4.1; Sodium 136     Component Value Date/Time  CHOL 126 02/09/2023 0447   CHOL 206 (H) 01/28/2020 0921   TRIG 83 02/09/2023 0447   HDL 53 02/09/2023 0447   HDL 53 01/28/2020 0921   CHOLHDL 2.4 02/09/2023 0447   VLDL 17 02/09/2023 0447   LDLCALC 56 02/09/2023 0447   LDLCALC 135 (H) 01/28/2020 0921     Assessment and Plan:   CAD manifested by positive cardiac stress test s/p 3V CABG (LIMA to LAD, SVG to diagonal, SVG to OM) with normal LVEF, angina free: Continue cardioprotective medications, aspirin 81 mg once daily, Plavix 75 mg once daily and atorvastatin 40 mg nightly. Continue DAPT for total duration of 1 year and then stop Plavix in the last week of March 2025.  SL NTG 0.4 mg as needed.  ER precautions for chest pain provided.  Continue p.o. Lasix 40 mg once daily, decrease metoprolol tartrate from 37.5 mg to 25 mg twice daily due to first-degree AV block and losartan 25 mg once  daily.  HLD, at goal: Continue atorvastatin 40 mg nightly.  First-degree AV block, asymptomatic: Decrease dose of metoprolol tartrate from 37.5 mg to 25 mg twice daily.  Alcohol abuse: Drinks 2-3 beers daily.  Counseling provided for alcohol cessation.    Medication Adjustments/Labs and Tests Ordered: Current medicines are reviewed at length with the patient today.  Concerns regarding medicines are outlined above.   Tests Ordered: Orders Placed This Encounter  Procedures   EKG 12-Lead    Medication Changes: No orders of the defined types were placed in this encounter.   Disposition:  Follow up  pending results  Signed Stella Bortle Verne Spurr, MD, 09/13/2023 10:55 AM    [facility] at [facility] [address] [place], [place], [place] [address]

## 2023-09-13 NOTE — Patient Instructions (Addendum)
Medication Instructions:  Your physician has recommended you make the following change in your medication:  Stop taking Plavix the last week of November 12, 2023 Decrease Metoprolol from 37.5 mg to 25 mg twice daily Continue taking all other medications as prescribed   Labwork: None  Testing/Procedures: None  Follow-Up: Your physician recommends that you schedule a follow-up appointment in: 6 months  Any Other Special Instructions Will Be Listed Below (If Applicable). Thank you for choosing [facility]!      If you need a refill on your cardiac medications before your next appointment, please call your pharmacy.

## 2023-09-14 DIAGNOSIS — D485 Neoplasm of uncertain behavior of skin: Secondary | ICD-10-CM | POA: Diagnosis not present

## 2023-09-14 DIAGNOSIS — D225 Melanocytic nevi of trunk: Secondary | ICD-10-CM | POA: Diagnosis not present

## 2023-09-14 DIAGNOSIS — Z1283 Encounter for screening for malignant neoplasm of skin: Secondary | ICD-10-CM | POA: Diagnosis not present

## 2023-09-14 DIAGNOSIS — L570 Actinic keratosis: Secondary | ICD-10-CM | POA: Diagnosis not present

## 2023-09-14 DIAGNOSIS — X32XXXD Exposure to sunlight, subsequent encounter: Secondary | ICD-10-CM | POA: Diagnosis not present

## 2023-09-27 DIAGNOSIS — I25119 Atherosclerotic heart disease of native coronary artery with unspecified angina pectoris: Secondary | ICD-10-CM | POA: Diagnosis not present

## 2023-09-27 DIAGNOSIS — J302 Other seasonal allergic rhinitis: Secondary | ICD-10-CM | POA: Diagnosis not present

## 2023-09-27 DIAGNOSIS — J61 Pneumoconiosis due to asbestos and other mineral fibers: Secondary | ICD-10-CM | POA: Diagnosis not present

## 2023-09-27 DIAGNOSIS — Z299 Encounter for prophylactic measures, unspecified: Secondary | ICD-10-CM | POA: Diagnosis not present

## 2023-11-14 ENCOUNTER — Other Ambulatory Visit: Payer: Self-pay | Admitting: Nurse Practitioner

## 2023-11-21 ENCOUNTER — Other Ambulatory Visit: Payer: Self-pay | Admitting: Internal Medicine

## 2023-12-12 ENCOUNTER — Other Ambulatory Visit: Payer: Self-pay | Admitting: Internal Medicine

## 2023-12-12 DIAGNOSIS — I440 Atrioventricular block, first degree: Secondary | ICD-10-CM

## 2024-01-23 DIAGNOSIS — Z299 Encounter for prophylactic measures, unspecified: Secondary | ICD-10-CM | POA: Diagnosis not present

## 2024-01-23 DIAGNOSIS — Z0000 Encounter for general adult medical examination without abnormal findings: Secondary | ICD-10-CM | POA: Diagnosis not present

## 2024-01-23 DIAGNOSIS — R52 Pain, unspecified: Secondary | ICD-10-CM | POA: Diagnosis not present

## 2024-01-23 DIAGNOSIS — J61 Pneumoconiosis due to asbestos and other mineral fibers: Secondary | ICD-10-CM | POA: Diagnosis not present

## 2024-02-08 ENCOUNTER — Other Ambulatory Visit: Payer: Self-pay | Admitting: Internal Medicine

## 2024-03-06 ENCOUNTER — Other Ambulatory Visit: Payer: Self-pay | Admitting: Internal Medicine

## 2024-03-09 DIAGNOSIS — H524 Presbyopia: Secondary | ICD-10-CM | POA: Diagnosis not present

## 2024-03-09 DIAGNOSIS — H43391 Other vitreous opacities, right eye: Secondary | ICD-10-CM | POA: Diagnosis not present

## 2024-03-10 ENCOUNTER — Other Ambulatory Visit: Payer: Self-pay | Admitting: Internal Medicine

## 2024-03-10 DIAGNOSIS — I440 Atrioventricular block, first degree: Secondary | ICD-10-CM

## 2024-03-13 DIAGNOSIS — R251 Tremor, unspecified: Secondary | ICD-10-CM | POA: Diagnosis not present

## 2024-03-13 DIAGNOSIS — R413 Other amnesia: Secondary | ICD-10-CM | POA: Diagnosis not present

## 2024-03-13 DIAGNOSIS — Z299 Encounter for prophylactic measures, unspecified: Secondary | ICD-10-CM | POA: Diagnosis not present

## 2024-04-06 DIAGNOSIS — R413 Other amnesia: Secondary | ICD-10-CM | POA: Diagnosis not present

## 2024-06-08 ENCOUNTER — Other Ambulatory Visit: Payer: Self-pay | Admitting: Internal Medicine

## 2024-06-08 DIAGNOSIS — I440 Atrioventricular block, first degree: Secondary | ICD-10-CM

## 2024-07-31 ENCOUNTER — Encounter: Payer: Self-pay | Admitting: Neurology

## 2024-07-31 ENCOUNTER — Ambulatory Visit: Admitting: Neurology

## 2024-07-31 VITALS — BP 138/78 | HR 60 | Ht 72.0 in

## 2024-07-31 DIAGNOSIS — R4189 Other symptoms and signs involving cognitive functions and awareness: Secondary | ICD-10-CM | POA: Diagnosis not present

## 2024-07-31 DIAGNOSIS — G250 Essential tremor: Secondary | ICD-10-CM | POA: Diagnosis not present

## 2024-07-31 DIAGNOSIS — G4710 Hypersomnia, unspecified: Secondary | ICD-10-CM | POA: Insufficient documentation

## 2024-07-31 MED ORDER — MEMANTINE HCL 10 MG PO TABS: 10.0000 mg | ORAL_TABLET | Freq: Two times a day (BID) | ORAL | 11 refills | Status: AC
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
## 2024-08-07 ENCOUNTER — Other Ambulatory Visit: Payer: Self-pay | Admitting: Internal Medicine

## 2024-08-08 LAB — COMPREHENSIVE METABOLIC PANEL WITH GFR
ALT: 21 IU/L (ref 0–44)
AST: 29 IU/L (ref 0–40)
Albumin: 4.4 g/dL (ref 3.9–4.9)
Alkaline Phosphatase: 70 IU/L (ref 47–123)
BUN/Creatinine Ratio: 13 (ref 10–24)
BUN: 10 mg/dL (ref 8–27)
Bilirubin Total: 0.3 mg/dL (ref 0.0–1.2)
CO2: 24 mmol/L (ref 20–29)
Calcium: 9.2 mg/dL (ref 8.6–10.2)
Chloride: 99 mmol/L (ref 96–106)
Creatinine, Ser: 0.75 mg/dL — ABNORMAL LOW (ref 0.76–1.27)
Globulin, Total: 2.4 g/dL (ref 1.5–4.5)
Glucose: 94 mg/dL (ref 70–99)
Potassium: 5 mmol/L (ref 3.5–5.2)
Sodium: 137 mmol/L (ref 134–144)
Total Protein: 6.8 g/dL (ref 6.0–8.5)
eGFR: 99 mL/min/1.73

## 2024-08-08 LAB — CBC WITH DIFFERENTIAL/PLATELET
Basophils Absolute: 0 x10E3/uL (ref 0.0–0.2)
Basos: 1 %
EOS (ABSOLUTE): 0.1 x10E3/uL (ref 0.0–0.4)
Eos: 2 %
Hematocrit: 44.5 % (ref 37.5–51.0)
Hemoglobin: 14.7 g/dL (ref 13.0–17.7)
Immature Grans (Abs): 0 x10E3/uL (ref 0.0–0.1)
Immature Granulocytes: 0 %
Lymphocytes Absolute: 2.2 x10E3/uL (ref 0.7–3.1)
Lymphs: 30 %
MCH: 32 pg (ref 26.6–33.0)
MCHC: 33 g/dL (ref 31.5–35.7)
MCV: 97 fL (ref 79–97)
Monocytes Absolute: 0.6 x10E3/uL (ref 0.1–0.9)
Monocytes: 9 %
Neutrophils Absolute: 4.3 x10E3/uL (ref 1.4–7.0)
Neutrophils: 58 %
Platelets: 189 x10E3/uL (ref 150–450)
RBC: 4.6 x10E6/uL (ref 4.14–5.80)
RDW: 12.6 % (ref 11.6–15.4)
WBC: 7.3 x10E3/uL (ref 3.4–10.8)

## 2024-08-08 LAB — ATN PROFILE
A -- Beta-amyloid 42/40 Ratio: 0.12
Beta-amyloid 40: 119.53 pg/mL
Beta-amyloid 42: 14.38 pg/mL
N -- NfL, Plasma: 1.98 pg/mL (ref 0.00–3.65)
T -- p-tau181: 1.29 pg/mL — ABNORMAL HIGH (ref 0.00–0.97)

## 2024-08-08 LAB — SYPHILIS: RPR W/REFLEX TO RPR TITER AND TREPONEMAL ANTIBODIES, TRADITIONAL SCREENING AND DIAGNOSIS ALGORITHM: RPR Ser Ql: NONREACTIVE

## 2024-08-08 LAB — HGB A1C W/O EAG: Hgb A1c MFr Bld: 5.7 % — ABNORMAL HIGH (ref 4.8–5.6)

## 2024-08-08 LAB — VITAMIN B12: Vitamin B-12: 1026 pg/mL (ref 232–1245)

## 2024-08-08 LAB — TSH: TSH: 3.23 u[IU]/mL (ref 0.450–4.500)

## 2024-08-13 ENCOUNTER — Ambulatory Visit: Payer: Self-pay | Admitting: Neurology

## 2024-08-21 ENCOUNTER — Encounter: Payer: Self-pay | Admitting: Internal Medicine

## 2024-08-21 ENCOUNTER — Ambulatory Visit: Attending: Internal Medicine | Admitting: Internal Medicine

## 2024-08-21 VITALS — BP 124/66 | HR 66 | Ht 71.0 in | Wt 251.4 lb

## 2024-08-21 DIAGNOSIS — I2510 Atherosclerotic heart disease of native coronary artery without angina pectoris: Secondary | ICD-10-CM | POA: Diagnosis not present

## 2024-08-21 DIAGNOSIS — Z79899 Other long term (current) drug therapy: Secondary | ICD-10-CM | POA: Diagnosis not present

## 2024-08-21 DIAGNOSIS — I10 Essential (primary) hypertension: Secondary | ICD-10-CM | POA: Diagnosis not present

## 2024-08-21 NOTE — Patient Instructions (Signed)
Medication Instructions:  Your physician recommends that you continue on your current medications as directed. Please refer to the Current Medication list given to you today.   Labwork: None  Testing/Procedures: None  Follow-Up: Your physician recommends that you schedule a follow-up appointment in: 1 year. You will receive a reminder call in about 8 months reminding you to schedule your appointment. If you don't receive this call, please contact our office.   Any Other Special Instructions Will Be Listed Below (If Applicable).  Referral to Pharm D  Thank you for choosing [facility]!     If you need a refill on your cardiac medications before your next appointment, please call your pharmacy.

## 2024-08-21 NOTE — Progress Notes (Signed)
 "    Cardiology Office Note  Date: 08/21/2024   ID: Christian Davis, Christian Davis 1956/05/07, MRN [id number]  PCP:  Christian Isles, MD  Cardiologist:  Diannah SHAUNNA Maywood, MD Electrophysiologist:  None    History of Present Illness: Christian Davis is a 69 y.o. male known to have self-reported pulmonary asbestosis, CAD manifested by positive cardiac stress test in March 2024 s/p three-vessel CABG with normal LVEF he is here for follow-up visit of CAD, HLD.  Patient underwent exercise Myoview  that showed borderline positive stress EKG changes, subsequent chest tightness during stress that never resolved and had to be sent to the ER, emphasized reversible perfusion defects in the apical to mid anterolateral and inferolateral location consistent with ischemia, another medium sized reversible defect present in the apical to basal inferior location consistent with ischemia.  Nuclear LVEF was 51%.  He subsequently underwent LHC that showed severe two-vessel CAD, sequential 50 to 70% ostial through mid LAD stenosis that her hemoglobin significant by RFR and CTO of dominant LCx with left to left and right to left collaterals.  LVEF was mildly reduced by LV gram and LVEDP was mild to moderately elevated, 20 to 25 mmHg.  He subsequently underwent three-vessel CABG (LIMA to LAD, SVG to diagonal, SVG to OM).  Denies any angina or DOE.  No dizziness, syncope, palpitations, leg swelling.  He lately had memory issues for which she currently follows with neurologist.  Currently on memantine  and donepezil .  Drinks 2-3 beers daily.  Counseling provided.  Does not smoke cigarettes.  He also has tremors that has been progressively getting worse according to the family.  Collateral history is obtained from the family.  Patient is also scheduled for sleep study.  Past Medical History:  Diagnosis Date   Arthritis    Cancer [facility])    Coronary artery calcification seen on CT scan    Depression    GERD (gastroesophageal reflux  disease)    H/O asbestos exposure    History of kidney stones     Past Surgical History:  Procedure Laterality Date   COLONOSCOPY WITH PROPOFOL  N/A 08/23/2022   Procedure: COLONOSCOPY WITH PROPOFOL ;  Surgeon: Cindie Carlin POUR, DO;  Location: [facility];  Service: Endoscopy;  Laterality: N/A;  9:45am, asa 3   CORONARY ARTERY BYPASS GRAFT N/A 11/18/2022   Procedure: CORONARY ARTERY BYPASS GRAFTING (CABG) x THREE BYPASSES USING OPEN LEFT INTERNAL MAMMARY ARTERY AND OPEN HARVESTED RIGHT GREATER SAPHENOUS VEIN;  Surgeon: Kerrin Elspeth BROCKS, MD;  Location: [facility] OR;  Service: Open Heart Surgery;  Laterality: N/A;   CORONARY PRESSURE/FFR STUDY N/A 11/16/2022   Procedure: INTRAVASCULAR PRESSURE WIRE/FFR STUDY;  Surgeon: Mady Bruckner, MD;  Location: [facility] INVASIVE CV LAB;  Service: Cardiovascular;  Laterality: N/A;   FRACTURE SURGERY Left 2002   LEFT HEART CATH AND CORONARY ANGIOGRAPHY N/A 11/16/2022   Procedure: LEFT HEART CATH AND CORONARY ANGIOGRAPHY;  Surgeon: Mady Bruckner, MD;  Location: [facility] INVASIVE CV LAB;  Service: Cardiovascular;  Laterality: N/A;   NECK SURGERY  1990   POLYPECTOMY  08/23/2022   Procedure: POLYPECTOMY;  Surgeon: Cindie Carlin POUR, DO;  Location: [facility];  Service: Endoscopy;;   RADIAL ARTERY HARVEST Left 11/18/2022   Procedure: ATTEMPTED RADIAL ARTERY HARVEST;  Surgeon: Kerrin Elspeth BROCKS, MD;  [facility];  Service: Open Heart Surgery;  Laterality: Left;   TEE WITHOUT CARDIOVERSION N/A 11/18/2022   Procedure: TRANSESOPHAGEAL ECHOCARDIOGRAM;  Surgeon: Kerrin Elspeth BROCKS, MD;  Location: [facility] OR;  Service: Open  Heart Surgery;  Laterality: N/A;    Current Outpatient Medications  Medication Sig Dispense Refill   acetaminophen  (TYLENOL ) 500 MG tablet Take 2 tablets (1,000 mg total) by mouth every 6 (six) hours as needed. 30 tablet 0   aspirin  EC (ASPIRIN  LOW DOSE) 81 MG tablet Take 1 tablet (81 mg total) by mouth daily with breakfast. ** SWALLOW WHOLE 90 tablet 1    azelastine (ASTELIN) 0.1 % nasal spray Place 2 sprays into both nostrils at bedtime. Use in each nostril as directed     cholecalciferol (VITAMIN D3) 25 MCG (1000 UNIT) tablet Take 1,000 Units by mouth daily.     citalopram  (CELEXA ) 20 MG tablet Take 20 mg by mouth daily.     cyanocobalamin (VITAMIN B12) 1000 MCG tablet Take 1,000 mcg by mouth daily.     donepezil  (ARICEPT  ODT) 10 MG disintegrating tablet Take 1 tablet (10 mg total) by mouth at bedtime. 30 tablet 3   furosemide  (LASIX ) 40 MG tablet TAKE 1 TABLET(40 MG) BY MOUTH DAILY AS NEEDED 90 tablet 0   levocetirizine (XYZAL) 5 MG tablet Take 5 mg by mouth daily.     losartan  (COZAAR ) 25 MG tablet TAKE 1 TABLET(25 MG) BY MOUTH DAILY 90 tablet 2   memantine  (NAMENDA ) 10 MG tablet Take 1 tablet (10 mg total) by mouth 2 (two) times daily. 60 tablet 11   Menthol, Topical Analgesic, (BLUE-EMU MAXIMUM STRENGTH EX) Apply 1 Application topically daily as needed (pain).     metoprolol  tartrate (LOPRESSOR ) 25 MG tablet TAKE 1 TABLET(25 MG) BY MOUTH TWICE DAILY 180 tablet 0   nitroGLYCERIN  (NITROSTAT ) 0.4 MG SL tablet Place 1 tablet (0.4 mg total) under the tongue every 5 (five) minutes as needed for chest pain. 30 tablet 3   potassium chloride  SA (KLOR-CON  M) 20 MEQ tablet Take 1 tablet (20 mEq total) by mouth daily as needed. 14 tablet 0   rosuvastatin  (CRESTOR ) 40 MG tablet TAKE 1 TABLET(40 MG) BY MOUTH DAILY 90 tablet 2   No current facility-administered medications for this visit.   Allergies:  Codeine   Social History: The patient  reports that he has never smoked. He has never been exposed to tobacco smoke. He has never used smokeless tobacco. He reports current alcohol use of about 21.0 standard drinks of alcohol per week. He reports that he does not use drugs.   Family History: The patient's family history includes Alzheimer's disease in his mother; Hypertension in his father.   ROS:  Please see the history of present illness. Otherwise,  complete review of systems is positive for none.  All other systems are reviewed and negative.   Physical Exam: VS:  Ht 5' 11 (1.803 m)   BMI 34.78 kg/m , BMI Body mass index is 34.78 kg/m.  Wt Readings from Last 3 Encounters:  09/13/23 249 lb 6.4 oz (113.1 kg)  03/25/23 249 lb 8 oz (113.2 kg)  03/10/23 248 lb 6.4 oz (112.7 kg)    General: Patient appears comfortable at rest. HEENT: Conjunctiva and lids normal, oropharynx clear with moist mucosa. Neck: Supple, no elevated JVP or carotid bruits, no thyromegaly. Lungs: Clear to auscultation, nonlabored breathing at rest. Cardiac: Regular rate and rhythm, no S3 or significant systolic murmur, no pericardial rub. Abdomen: Soft, nontender, no hepatomegaly, bowel sounds present, no guarding or rebound. Extremities: No pitting edema, distal pulses 2+. Skin: Warm and dry. Musculoskeletal: No kyphosis. Neuropsychiatric: Alert and oriented x3, affect grossly appropriate.  ECG:  NSR  Recent  Labwork: 07/31/2024: ALT 21; AST 29; BUN 10; Creatinine, Ser 0.75; Hemoglobin 14.7; Platelets 189; Potassium 5.0; Sodium 137; TSH 3.230     Component Value Date/Time   CHOL 126 02/09/2023 0447   CHOL 206 (H) 01/28/2020 0921   TRIG 83 02/09/2023 0447   HDL 53 02/09/2023 0447   HDL 53 01/28/2020 0921   CHOLHDL 2.4 02/09/2023 0447   VLDL 17 02/09/2023 0447   LDLCALC 56 02/09/2023 0447   LDLCALC 135 (H) 01/28/2020 0921     Assessment and Plan:   CAD manifested by positive cardiac stress test s/p 3V CABG (LIMA to LAD, SVG to diagonal, SVG to OM) with normal LVEF, angina free: No interval angina or DOE.  Continue aspirin  81 mg once daily, rosuvastatin  40 mg nightly.  EKG today showed NSR.  Continue metoprolol  tartrate 25 mg twice daily.  HLD, at goal: Continue atorvastatin 40 mg nightly.  LDL 56 in May 2024.  First-degree AV block: After decreasing dose of metoprolol  to tartrate from 37.5 mg to 25 mg, first-degree AV block resolved.  Alcohol  abuse: Drinks 2-3 beers daily.  Counseling provided for alcohol cessation.  Morbid obesity with BMI 35: Due to CAD and morbid obesity, he will benefit from [facility] cessation.  Referral to Pharm.D in [place].   HTN, controlled: Continue current and hypertensives, p.o. Lasix  40 mg once daily, losartan  25 mg once daily, metoprolol  tartrate 25 mg twice daily.   I spent 30 minutes in reviewing prior medical records, reports, more than 3 labs, discussion and documentation.  Medication Adjustments/Labs and Tests Ordered: Current medicines are reviewed at length with the patient today.  Concerns regarding medicines are outlined above.   Tests Ordered: Orders Placed This Encounter  Procedures   EKG 12-Lead    Medication Changes: No orders of the defined types were placed in this encounter.   Disposition:  Follow up 1 year  Signed Montrel Donahoe Priya Gwen Sarvis, MD, 08/21/2024 10:38 AM    [facility] [address] [place], [place], [place] [address]  "

## 2024-08-27 ENCOUNTER — Other Ambulatory Visit: Payer: Self-pay | Admitting: Internal Medicine

## 2024-09-05 ENCOUNTER — Telehealth: Payer: Self-pay | Admitting: Pharmacy Technician

## 2024-09-05 ENCOUNTER — Telehealth: Payer: Self-pay

## 2024-09-05 ENCOUNTER — Other Ambulatory Visit (HOSPITAL_COMMUNITY): Payer: Self-pay

## 2024-09-05 ENCOUNTER — Ambulatory Visit: Payer: PRIVATE HEALTH INSURANCE | Attending: Cardiology

## 2024-09-05 NOTE — Telephone Encounter (Signed)
 Christian Davis BRAVO, [place] to Rx Prior Auth Team     09/05/24 11:27 AM Please assess coverage for Wegovy, hx of NSTEMI   Pharmacy Patient Advocate Encounter   Received notification from Pt Calls Messages that prior authorization for wegovy is required/requested.   Insurance verification completed.   The patient is insured through [employer].   Per test claim: PA required; PA submitted to above mentioned insurance via Latent Key/confirmation #/EOC [id number] Status is pending

## 2024-09-05 NOTE — Telephone Encounter (Signed)
 Pharmacy Patient Advocate Encounter  Received notification from [facility] that Prior Authorization for wegovy has been APPROVED from 09/05/24 to 08/15/25. Ran test claim, Copay is $744.01. This test claim was processed through [facility]- copay amounts may vary at other pharmacies due to pharmacy/plan contracts, or as the patient moves through the different stages of their insurance plan.   PA #/Case ID/Reference #: [id number]    355.00 deductible then cost is 389.01

## 2024-09-05 NOTE — Patient Instructions (Signed)
 I will be in process of reaching out to insurance to see if they cover GLP-1RA  Nilza Eaker E. Madix Blowe, Pharm.D, CPP [facility]. [facility] [address], [place], [place] [address] Phone: [phone]; Fax: [phone]     GLP-1 Receptor Agonist Counseling Points This medication reduces your appetite and may make you feel fuller longer.  Stop eating when your body tells you that you are full. This will likely happen sooner than you are used to. Fried/greasy food and sweets may upset your stomach - minimize these as much as possible. Store your medication in the fridge until you are ready to use it. Inject your medication in the fatty tissue of your lower abdominal area (2 inches away from belly button) or upper outer thigh. Rotate injection sites. Common side effects include: nausea, diarrhea/constipation, and heartburn, and are more likely to occur if you overeat. Stop your injection for 7 days prior to surgical procedures requiring anesthesia.  Dosing schedule:  We will touch base with you monthly over the phone. The medication can be increased in monthly intervals depending on tolerability and efficacy.  Tips for success: Write down the reasons why you want to lose weight and post it in a place where you'll see it often.  Start small and work your way up. Keep in mind that it takes time to achieve goals, and small steps add up.  Any additional movements help to burn calories. Taking the stairs rather than the elevator and parking at the far end of your parking lot are easy ways to start. Brisk walking for at least 30 minutes 4 or more days of the week is an excellent goal to work toward  Understanding what it means to feel full: Did you know that it can take 15 minutes or more for your brain to receive the message that you've eaten? That means that, if you eat less food, but consume it slower, you may still feel satisfied.  Eating a lot of  fruits and vegetables can also help you feel fuller.  Eat off of smaller plates so that moderate portions don't seem too small  Tips for living a healthier life     Building a Healthy and Balanced Diet Make most of your meal vegetables and fruits -  of your plate. Aim for color and variety, and remember that potatoes dont count as vegetables on the Healthy Eating Plate because of their negative impact on blood sugar.  Go for whole grains -  of your plate. Whole and intact grains--whole wheat, barley, wheat berries, quinoa, oats, brown rice, and foods made with them, such as whole wheat pasta--have a milder effect on blood sugar and insulin  than Genise Strack bread, Cecilia Vancleve rice, and other refined grains.  Protein power -  of your plate. Fish, poultry, beans, and nuts are all healthy, versatile protein sources--they can be mixed into salads, and pair well with vegetables on a plate. Limit red meat, and avoid processed meats such as bacon and sausage.  Healthy plant oils - in moderation. Choose healthy vegetable oils like olive, canola, soy, corn, sunflower, peanut, and others, and avoid partially hydrogenated oils, which contain unhealthy trans fats. Remember that low-fat does not mean healthy.  Drink water, coffee, or tea. Skip sugary drinks, limit milk and dairy products to one to two servings per day, and limit juice to a small glass per day.  Stay active. The red figure running across the Healthy Eating Plates  placemat is a reminder that staying active is also important in weight control.  The main message of the Healthy Eating Plate is to focus on diet quality:  The type of carbohydrate in the diet is more important than the amount of carbohydrate in the diet, because some sources of carbohydrate--like vegetables (other than potatoes), fruits, whole grains, and beans--are healthier than others. The Healthy Eating Plate also advises consumers to avoid sugary beverages, a major source  of calories--usually with little nutritional value--in the American diet. The Healthy Eating Plate encourages consumers to use healthy oils, and it does not set a maximum on the percentage of calories people should get each day from healthy sources of fat. In this way, the Healthy Eating Plate recommends the opposite of the low-fat message promoted for decades by the USDA.  cuetune.com.ee  SUGAR  Sugar is a huge problem in the modern day diet. Sugar is a big contributor to heart disease, diabetes, high triglyceride levels, fatty liver disease and obesity. Sugar is hidden in almost all packaged foods/beverages. Added sugar is extra sugar that is added beyond what is naturally found and has no nutritional benefit for your body. The American Heart Association recommends limiting added sugars to no more than 25g for women and 36 grams for men per day. There are many names for sugar including maltose, sucrose (names ending in ose), high fructose corn syrup, molasses, cane sugar, corn sweetener, raw sugar, syrup, honey or fruit juice concentrate.   One of the best ways to limit your added sugars is to stop drinking sweetened beverages such as soda, sweet tea, and fruit juice.  There is 65g of added sugars in one 20oz bottle of Coke! That is equal to 7.5 donuts.   Pay attention and read all nutrition facts labels. Below is an examples of a nutrition facts label. The #1 is showing you the total sugars where the # 2 is showing you the added sugars. This one serving has almost the max amount of added sugars per day!   EXERCISE  Exercise is good. Weve all heard that. In an ideal world, we would all have time and resources to get plenty of it. When you are active, your heart pumps more efficiently and you will feel better.  Multiple studies show that even walking regularly has benefits that include living a longer life. The American Heart Association  recommends 150 minutes per week of exercise (30 minutes per day most days of the week). You can do this in any increment you wish. Nine or more 10-minute walks count. So does an hour-long exercise class. Break the time apart into what will work in your life. Some of the best things you can do include walking briskly, jogging, cycling or swimming laps. Not everyone is ready to exercise. Sometimes we need to start with just getting active. Here are some easy ways to be more active throughout the day:  Take the stairs instead of the elevator  Go for a 10-15 minute walk during your lunch break (find a friend to make it more enjoyable)  When shopping, park at the back of the parking lot  If you take public transportation, get off one stop early and walk the extra distance  Pace around while making phone calls  Check with your doctor if you arent sure what your limitations may be. Always remember to drink plenty of water when doing any type of exercise. Dont feel like a failure if youre not getting the 90-150  minutes per week. If you started by being a couch potato, then just a 10-minute walk each day is a huge improvement. Start with little victories and work your way up.   HEALTHY EATING TIPS              Plan ahead: make a menu of the meals for a week then create a grocery list to go with that menu. Consider meals that easily stretch into a night of leftovers, such as stews or casseroles. Or consider making two of your favorite meal and put one in the freezer for another night. Try a night or two each week that is meatless or no cook such as salads. When you get home from the grocery store wash and prepare your vegetables and fruits. Then when you need them they are ready to go.   Tips for going to the grocery store:  Buy store or generic brands  Check the weekly ad from your store on-line or in their in-store flyer  Look at the unit price on the shelf tag to compare/contrast the costs of  different items  Buy fruits/vegetables in season  Carrots, bananas and apples are low-cost, naturally healthy items  If meats or frozen vegetables are on sale, buy some extras and put in your freezer  Limit buying prepared or ready to eat items, even if they are pre-made salads or fruit snacks  Do not shop when youre hungry  Foods at eye level tend to be more expensive. Look on the high and low shelves for deals.  Consider shopping at the farmers market for fresh foods in season.  Avoid the cookie and chip aisles (these are expensive, high in calories and low in nutritional value). Shop on the outside of the grocery store.  Healthy food preparations:  If you cant get lean hamburger, be sure to drain the fat when cooking  Steam, saut (in olive oil), grill or bake foods  Experiment with different seasonings to avoid adding salt to your foods. Kosher salt, sea salt and Himalayan salt are all still salt and should be avoided. Try seasoning food with onion, garlic, thyme, rosemary, basil ect. Onion powder or garlic powder is ok. Avoid if it says salt (ie garlic salt).

## 2024-09-05 NOTE — Progress Notes (Signed)
 Patient ID: Christian Davis                 DOB: 1956/01/07                    MRN: [id number]     HPI: Christian Davis is a 69 y.o. male patient referred to pharmacy clinic by Dr. Mallipeddi to initiate GLP1-RA therapy. PMH is significant for CAD s/p CABG x3, NSTEMI ( 11/2022), HTN, HLD, and obesity. Most recent BMI 34.90 kg/m. Most recent A1c is 5.7% (07/2024).  The patient was recently evaluated by Dr. Mallipeddi for a follow-up visit and given his history of CAD and morbid obesity, was referred to the PharmD clinic to discuss [facility] therapy.  He presents today in good spirits and is accompanied by his wife. He expresses interest in starting a GLP-1 agent. He reports drinking 4-5 beers daily, and his diet varies, often including baked or fried foods. His wife, who prepares most meals, states she plans to begin cooking healthier options and will support him in adopting healthier eating habits. He reports no regular exercise, particularly during the colder winter months. The patient's wife asked whether this therapy would be long-term. It was explained that [name] has demonstrated significant cardiovascular benefits and based on his hx of CAD NSTEMI it is intended for chronic, ongoing use. We also discussed the potential for weight regain if the medication is discontinued.  Current weight and BMI: 250 lbs and 34.90 kg/m Goal weight: 219 lbs   Current meds that affect weight: furosemide   Diet:  Breakfast: Scrambled eggs with salsa, bacon, and a cheese wrap; Zero Sugar Dr Nunzio. Lunch/Dinner: Usually skips lunch. Typical dinner includes hamburgers or fried/baked chicken with sides such as squash, turnip greens, pinto beans, corn, or okra. Snacks: Popcorn Glass Blower/designer). Beverages: Primarily Zero Sugar Dr Nunzio; drinks about one bottle of water per day   Exercise: none  Family History:  Relation Problem Comments  Mother (Deceased) Alzheimer's disease     Father (Deceased) Hypertension        Social History: Alcohol: 4-5 beer/day; He is willing to decrease to 1-2 beers per day Smoking: never   Labs: Lab Results  Component Value Date   HGBA1C 5.7 (H) 07/31/2024    Wt Readings from Last 1 Encounters:  08/21/24 251 lb 6.4 oz (114 kg)    BP Readings from Last 1 Encounters:  08/21/24 124/66   Pulse Readings from Last 1 Encounters:  08/21/24 66       Component Value Date/Time   CHOL 126 02/09/2023 0447   CHOL 206 (H) 01/28/2020 0921   TRIG 83 02/09/2023 0447   HDL 53 02/09/2023 0447   HDL 53 01/28/2020 0921   CHOLHDL 2.4 02/09/2023 0447   VLDL 17 02/09/2023 0447   LDLCALC 56 02/09/2023 0447   LDLCALC 135 (H) 01/28/2020 0921    Past Medical History:  Diagnosis Date   Arthritis    Cancer (HCC)    Coronary artery calcification seen on CT scan    Depression    GERD (gastroesophageal reflux disease)    H/O asbestos exposure    History of kidney stones     Medications Ordered Prior to Encounter[1]  Allergies[2]   Assessment/Plan:  1. Weight loss - Patient has not met goal of at least 5% of body weight loss with comprehensive lifestyle modifications alone in the past 3-6 months. Pharmacotherapy is appropriate to pursue as augmentation. Will start Wegovy. Confirmed patient has no  personal or family history of medullary thyroid  carcinoma (MTC) or Multiple Endocrine Neoplasia syndrome type 2 (MEN 2). Injection technique reviewed at today's visit.  Advised patient on common side effects including nausea, diarrhea, dyspepsia, decreased appetite, and fatigue. Counseled patient on reducing meal size and how to titrate medication to minimize side effects. Counseled patient to call if intolerable side effects or if experiencing dehydration, abdominal pain, or dizziness. Along with pharmacotherapy, the patient will follow dietary modifications and aim for at least 150 minutes of moderate-intensity exercise per week, plus resistance training twice a week (as  recommended by the American Heart Association). This resistance training--such as weightlifting, bodyweight exercises, or using resistance bands, adapted to the patients ability--will help prevent muscle loss.  Follow up in 1-2 days regarding coverage of Wegovy. If therapy is initiated, phone follow-ups will be conducted every 4 weeks for dose titration until the patient reaches the effective therapeutic dose and target weight.  Hajar Penninger E. Shady Bradish, Pharm.D, CPP [name]. [facility] [address], [place], [place] [address] Phone: [phone]; Fax: [phone]      [1]  Current Outpatient Medications on File Prior to Visit  Medication Sig Dispense Refill   acetaminophen  (TYLENOL ) 500 MG tablet Take 2 tablets (1,000 mg total) by mouth every 6 (six) hours as needed. 30 tablet 0   aspirin  EC (ASPIRIN  LOW DOSE) 81 MG tablet TAKE 1 TABLET(81 MG) BY MOUTH DAILY WITH BREAKFAST. SWALLOW WHOLE 90 tablet 3   azelastine (ASTELIN) 0.1 % nasal spray Place 2 sprays into both nostrils at bedtime. Use in each nostril as directed     cholecalciferol (VITAMIN D3) 25 MCG (1000 UNIT) tablet Take 1,000 Units by mouth daily.     citalopram  (CELEXA ) 20 MG tablet Take 20 mg by mouth daily.     cyanocobalamin (VITAMIN B12) 1000 MCG tablet Take 1,000 mcg by mouth daily.     donepezil  (ARICEPT  ODT) 10 MG disintegrating tablet Take 1 tablet (10 mg total) by mouth at bedtime. 30 tablet 3   furosemide  (LASIX ) 40 MG tablet TAKE 1 TABLET(40 MG) BY MOUTH DAILY AS NEEDED 90 tablet 0   levocetirizine (XYZAL) 5 MG tablet Take 5 mg by mouth daily.     losartan  (COZAAR ) 25 MG tablet TAKE 1 TABLET(25 MG) BY MOUTH DAILY 90 tablet 2   memantine  (NAMENDA ) 10 MG tablet Take 1 tablet (10 mg total) by mouth 2 (two) times daily. 60 tablet 11   Menthol, Topical Analgesic, (BLUE-EMU MAXIMUM STRENGTH EX) Apply 1 Application topically daily as needed (pain).     metoprolol  tartrate  (LOPRESSOR ) 25 MG tablet TAKE 1 TABLET(25 MG) BY MOUTH TWICE DAILY 180 tablet 0   nitroGLYCERIN  (NITROSTAT ) 0.4 MG SL tablet Place 1 tablet (0.4 mg total) under the tongue every 5 (five) minutes as needed for chest pain. 30 tablet 3   potassium chloride  SA (KLOR-CON  M) 20 MEQ tablet Take 1 tablet (20 mEq total) by mouth daily as needed. 14 tablet 0   rosuvastatin  (CRESTOR ) 40 MG tablet TAKE 1 TABLET(40 MG) BY MOUTH DAILY 90 tablet 2   No current facility-administered medications on file prior to visit.  [2]  Allergies Allergen Reactions   Codeine Anxiety

## 2024-09-05 NOTE — Telephone Encounter (Signed)
 Please see other encounter.

## 2024-09-06 ENCOUNTER — Other Ambulatory Visit: Payer: Self-pay | Admitting: Internal Medicine

## 2024-09-06 DIAGNOSIS — I440 Atrioventricular block, first degree: Secondary | ICD-10-CM

## 2024-09-06 NOTE — Telephone Encounter (Signed)
 Discussed Wegovy copay price and self pay options. Patient to call insurance company to gather more information regarding future price before proceeding with therapy. Will await on their call back with decision.

## 2024-09-11 ENCOUNTER — Encounter: Payer: Self-pay | Admitting: Neurology

## 2024-09-11 ENCOUNTER — Ambulatory Visit: Admitting: Neurology

## 2024-09-11 VITALS — BP 140/76 | HR 65 | Ht 72.0 in | Wt 248.7 lb

## 2024-09-11 DIAGNOSIS — R4189 Other symptoms and signs involving cognitive functions and awareness: Secondary | ICD-10-CM | POA: Diagnosis not present

## 2024-09-11 DIAGNOSIS — G4733 Obstructive sleep apnea (adult) (pediatric): Secondary | ICD-10-CM | POA: Diagnosis not present

## 2024-09-11 DIAGNOSIS — Z789 Other specified health status: Secondary | ICD-10-CM | POA: Diagnosis not present

## 2024-09-11 DIAGNOSIS — R351 Nocturia: Secondary | ICD-10-CM | POA: Diagnosis not present

## 2024-09-11 DIAGNOSIS — E66811 Obesity, class 1: Secondary | ICD-10-CM

## 2024-09-11 NOTE — Progress Notes (Signed)
 Subjective:    Patient ID: Christian Davis is a 69 y.o. male.  HPI    True Mar, MD, PhD [facility] [address], [address] [address] [place], [place] [address]  Dear [place],  I saw your patient, Christian Davis, upon your kind request in my sleep clinic today for initial consultation of his sleep disorder, in particular, concern for underlying obstructive sleep apnea.  The patient is accompanied by his wife today.  As you know, Christian Davis is a 69 year old male with an underlying medical history of cognitive impairment, tremor, allergies, vitamin D deficiency, hypertension, hyperlipidemia, arthritis, reflux disease, history of kidney stones, depression and obesity, who reports very little. His wife provides additional history and reports that they are here to make sure his oxygen level is good at night.  The patient reports that he will likely not use a CPAP machine and she reports that he will likely not be able to use the machine at night.  His Epworth sleepiness score is 3 out of 24, fatigue severity score is 9 out of 63.  I reviewed your office note from 07/31/2024.  He was diagnosed with mild obstructive sleep apnea in 2018 with a home sleep test (via [employer] device).  His AHI was 6.4/h, O2 nadir 89%.  He has not been on PAP therapy.  His BMI was 35.7 at the time.  He has had some interim weight loss.  He goes to bed generally between 9 and 10 PM and rise time is generally between 7:30 AM and 8:30 AM.  He has nocturia about once per average night, denies recurrent nocturnal or morning headaches.  He is not aware of any family history of sleep apnea.  He lives with his wife, they have 2 dogs in the household, they typically sleep on the bed with them at the foot and.  They have 1 grandson.  He drinks caffeine in the form of soda, about 1 or 2 bottles per day, 16.9 ounce size each.  He is a non-smoker.  He drinks alcohol daily in the form of beer,  about 4 or 5/day.  He is currently not working on cutting back his alcohol consumption.   His Past Medical History Is Significant For: Past Medical History:  Diagnosis Date   Arthritis    Cancer (HCC)    Coronary artery calcification seen on CT scan    Depression    GERD (gastroesophageal reflux disease)    H/O asbestos exposure    History of kidney stones     His Past Surgical History Is Significant For: Past Surgical History:  Procedure Laterality Date   COLONOSCOPY WITH PROPOFOL  N/A 08/23/2022   Procedure: COLONOSCOPY WITH PROPOFOL ;  Surgeon: Cindie Carlin POUR, DO;  Location: [facility];  Service: Endoscopy;  Laterality: N/A;  9:45am, asa 3   CORONARY ARTERY BYPASS GRAFT N/A 11/18/2022   Procedure: CORONARY ARTERY BYPASS GRAFTING (CABG) x THREE BYPASSES USING OPEN LEFT INTERNAL MAMMARY ARTERY AND OPEN HARVESTED RIGHT GREATER SAPHENOUS VEIN;  Surgeon: Kerrin Elspeth BROCKS, MD;  Location: [facility] OR;  Service: Open Heart Surgery;  Laterality: N/A;   CORONARY PRESSURE/FFR STUDY N/A 11/16/2022   Procedure: INTRAVASCULAR PRESSURE WIRE/FFR STUDY;  Surgeon: Mady Bruckner, MD;  Location: [facility] INVASIVE CV LAB;  Service: Cardiovascular;  Laterality: N/A;   FRACTURE SURGERY Left 2002   LEFT HEART CATH AND CORONARY ANGIOGRAPHY N/A 11/16/2022   Procedure: LEFT HEART CATH AND CORONARY ANGIOGRAPHY;  Surgeon: Mady Bruckner, MD;  Location: [facility]VAS[facility] CV LAB;  Service: Cardiovascular;  Laterality: N/A;   NECK SURGERY  1990   POLYPECTOMY  08/23/2022   Procedure: POLYPECTOMY;  Surgeon: Cindie Carlin POUR, DO;  Location: [facility];  Service: Endoscopy;;   RADIAL ARTERY HARVEST Left 11/18/2022   Procedure: ATTEMPTED RADIAL ARTERY HARVEST;  Surgeon: Kerrin Elspeth BROCKS, MD;  [facility];  Service: Open Heart Surgery;  Laterality: Left;   TEE WITHOUT CARDIOVERSION N/A 11/18/2022   Procedure: TRANSESOPHAGEAL ECHOCARDIOGRAM;  Surgeon: Kerrin Elspeth BROCKS, MD;  Location[facility];  Service: Open Heart Surgery;   Laterality: N/A;    His Family History Is Significant For: Family History  Problem Relation Age of Onset   Alzheimer's disease Mother    Hypertension Father     His Social History Is Significant For: Social History   Socioeconomic History   Marital status: Married    Spouse name: Not on file   Number of children: Not on file   Years of education: Not on file   Highest education level: Not on file  Occupational History   Not on file  Tobacco Use   Smoking status: Never    Passive exposure: Never   Smokeless tobacco: Never  Vaping Use   Vaping status: Never Used  Substance and Sexual Activity   Alcohol use: Yes    Alcohol/week: 12.0 standard drinks of alcohol    Types: 12 Cans of beer per week   Drug use: Never   Sexual activity: Not on file  Other Topics Concern   Not on file  Social History Narrative   Pt lives with wife    Retired    Social Drivers of Health   Tobacco Use: Low Risk (09/11/2024)   Patient History    Smoking Tobacco Use: Never    Smokeless Tobacco Use: Never    Passive Exposure: Never  Financial Resource Strain: Not on file  Food Insecurity: No Food Insecurity (02/08/2023)   Hunger Vital Sign    Worried About Running Out of Food in the Last Year: Never true    Ran Out of Food in the Last Year: Never true  Transportation Needs: No Transportation Needs (02/08/2023)   PRAPARE - Administrator, Civil Service (Medical): No    Lack of Transportation (Non-Medical): No  Physical Activity: Not on file  Stress: Not on file  Social Connections: Not on file  Depression (PHQ2-9): Low Risk (01/20/2023)   Depression (PHQ2-9)    PHQ-2 Score: 0  Alcohol Screen: Not on file  Housing: Low Risk (02/08/2023)   Housing    Last Housing Risk Score: 0  Utilities: Not At Risk (02/08/2023)   AHC Utilities    Threatened with loss of utilities: No  Health Literacy: Not on file    His Allergies Are:  Allergies[1]:   His Current Medications Are:   Outpatient Encounter Medications as of 09/11/2024  Medication Sig   acetaminophen  (TYLENOL ) 500 MG tablet Take 2 tablets (1,000 mg total) by mouth every 6 (six) hours as needed.   aspirin  EC (ASPIRIN  LOW DOSE) 81 MG tablet TAKE 1 TABLET(81 MG) BY MOUTH DAILY WITH BREAKFAST. SWALLOW WHOLE   azelastine (ASTELIN) 0.1 % nasal spray Place 2 sprays into both nostrils at bedtime. Use in each nostril as directed   cholecalciferol (VITAMIN D3) 25 MCG (1000 UNIT) tablet Take 1,000 Units by mouth daily.   citalopram  (CELEXA ) 20 MG tablet Take 20 mg by mouth daily.   cyanocobalamin (VITAMIN B12) 1000  MCG tablet Take 1,000 mcg by mouth daily.   donepezil  (ARICEPT  ODT) 10 MG disintegrating tablet Take 1 tablet (10 mg total) by mouth at bedtime.   furosemide  (LASIX ) 40 MG tablet TAKE 1 TABLET(40 MG) BY MOUTH DAILY AS NEEDED   levocetirizine (XYZAL) 5 MG tablet Take 5 mg by mouth daily.   losartan  (COZAAR ) 25 MG tablet TAKE 1 TABLET(25 MG) BY MOUTH DAILY   memantine  (NAMENDA ) 10 MG tablet Take 1 tablet (10 mg total) by mouth 2 (two) times daily.   Menthol, Topical Analgesic, (BLUE-EMU MAXIMUM STRENGTH EX) Apply 1 Application topically daily as needed (pain).   metoprolol  tartrate (LOPRESSOR ) 25 MG tablet TAKE 1 TABLET(25 MG) BY MOUTH TWICE DAILY   nitroGLYCERIN  (NITROSTAT ) 0.4 MG SL tablet Place 1 tablet (0.4 mg total) under the tongue every 5 (five) minutes as needed for chest pain.   potassium chloride  SA (KLOR-CON  M) 20 MEQ tablet Take 1 tablet (20 mEq total) by mouth daily as needed.   rosuvastatin  (CRESTOR ) 40 MG tablet TAKE 1 TABLET(40 MG) BY MOUTH DAILY   No facility-administered encounter medications on file as of 09/11/2024.  :   Review of Systems:  Out of a complete 14 point review of systems, all are reviewed and negative with the exception of these symptoms as listed below:   Review of Systems  Objective:  Neurological Exam  Physical Exam Physical Examination:   Vitals:   09/11/24  1352  BP: (!) 140/76  Pulse: 65    General Examination: The patient is a very pleasant 69 y.o. male in no acute distress. He appears somewhat deconditioned.    HEENT: Normocephalic, atraumatic, pupils are equal, round and reactive to light, extraocular tracking is good without limitation to gaze excursion or nystagmus noted. No photophobia.  No Corrective eye glasses in place. Hearing is grossly intact.  Face is symmetric with normal facial animation to maybe mildly decreased facial expression. Speech is clear without dysarthria. There is no hypophonia. There is no lip, neck/head, jaw or voice tremor. Neck shows good range of motion, no carotid bruits.  Airway examination reveals moderate airway crowding secondary to small airway entry, tonsils on the smaller side, prominent uvula, Mallampati class II, neck circumference 18 three-quarter inches, minimal overbite noted.  Tongue protrudes centrally and palate elevates symmetrically.  Chest: Clear to auscultation without wheezing, rhonchi or crackles noted.  Heart: S1+S2+0, regular and normal without murmurs, rubs or gallops noted.   Abdomen: Soft, non-tender and non-distended.  Extremities: There is no pitting edema in the distal lower extremities bilaterally.   Skin: Warm and dry without trophic changes noted.   Musculoskeletal: exam reveals no obvious joint deformities.   Neurologically:  Mental status: The patient is awake, pays attention but does not provide much in the way of history.  Some answers are just by head shaking or shoulder shrugging.  History is heavily supplemented by his wife.   Cranial nerves II - XII are as described above under HEENT exam.  Motor exam: Normal bulk, moving all 4 extremities without any obvious restriction.  He has a bilateral upper extremity resting and action and postural tremor. Fine motor skills and coordination: Intact grossly.  Cerebellar testing: No dysmetria or intention tremor. There is no  truncal or gait ataxia.  Sensory exam: intact to light touch in the upper and lower extremities.  Gait, station and balance: He stands without significant difficulty, posture is mildly stooped for age and he walks slowly, he does have decreased arm swing  in both upper extremities, right more than left.  Assessment and plan:   In summary, Christian Davis is a 69 year old male with an underlying medical history of cognitive impairment, tremor, allergies, vitamin D deficiency, hypertension, hyperlipidemia, arthritis, reflux disease, history of kidney stones, depression and obesity, whose history and physical exam are concerning for sleep disordered breathing, particularly obstructive sleep apnea (OSA). A laboratory attended sleep study is typically considered gold standard for evaluation of sleep disordered breathing.  The patient was diagnosed with mild obstructive sleep apnea in 2018 via home sleep testing.  He has not been on PAP therapy. I had a long chat with the patient and his wife about my findings and the diagnosis of sleep apnea, particularly OSA, its prognosis and treatment options. We talked about medical/conservative treatments, surgical interventions and non-pharmacological approaches for symptom control. I explained, in particular, the risks and ramifications of untreated moderate to severe OSA, especially with respect to developing cardiovascular disease down the road, including congestive heart failure (CHF), difficult to treat hypertension, cardiac arrhythmias (particularly A-fib), neurovascular complications including TIA, stroke and dementia. Even type 2 diabetes has, in part, been linked to untreated OSA. Symptoms of untreated OSA may include (but may not be limited to) daytime sleepiness, nocturia (i.e. frequent nighttime urination), memory problems, mood irritability and suboptimally controlled or worsening mood disorder such as depression and/or anxiety, lack of energy, lack of  motivation, physical discomfort, as well as recurrent headaches, especially morning or nocturnal headaches. We talked about the importance of maintaining a healthy lifestyle and striving for healthy weight.  The importance of reducing his daily alcohol consumption was also addressed.  In addition, we talked about the importance of striving for and maintaining good sleep hygiene. I recommended a sleep study at this time. I outlined the differences between a laboratory attended sleep study which is considered more comprehensive and accurate over the option of a home sleep test (HST); the latter may lead to underestimation of sleep disordered breathing in some instances and does not help with diagnosing upper airway resistance syndrome and is not accurate enough to diagnose primary central sleep apnea typically. I outlined possible surgical and non-surgical treatment options of OSA, including the use of a positive airway pressure (PAP) device (i.e. CPAP, AutoPAP/APAP or BiPAP in certain circumstances), a custom-made dental device (aka oral appliance, which would require a referral to a specialist dentist or orthodontist typically, and is generally speaking not considered for patients with full dentures or edentulous state), upper airway surgical options, such as traditional UPPP (which is not considered a first-line treatment) or the Inspire device (hypoglossal nerve stimulator, which would involve a referral for consultation with an ENT surgeon, after careful selection, following inclusion criteria - also not first-line treatment). I explained the PAP treatment option to the patient in detail, as this is generally considered first-line treatment.  The patient indicated that he would likely not be willing to consider PAP therapy.  We will pick up our discussion about the next steps and treatment options after testing.  We will keep them posted as to the test results by phone call and/or MyChart messaging where  possible.  We will plan to follow-up in sleep clinic accordingly as well.  I answered all their questions today and the patient and his wife were in agreement.   I encouraged them to call with any interim questions, concerns, problems or updates or email us  through MyChart.  Generally speaking, sleep test authorizations may take up to 2 weeks,  sometimes less, sometimes longer, the patient is encouraged to get in touch with us  if they do not hear back from the sleep lab staff directly within the next 2 weeks.  Thank you very much for allowing me to participate in the care of this nice patient. If I can be of any further assistance to you please do not hesitate to call me at [phone].  Sincerely,   True Mar, MD, PhD     [1]  Allergies Allergen Reactions   Codeine Anxiety

## 2024-09-11 NOTE — Patient Instructions (Signed)
 Thank you for choosing [facility] for your sleep related care! It was nice to meet you today!   Here is what we discussed today:    Please work on reducing and eliminating daily alcohol use as alcohol can be detrimental to tremor disorders, sleep disorders, and cognitive disorders.  Based on your symptoms and your exam I believe you are at risk for obstructive sleep apnea (aka OSA). We should proceed with a sleep study to determine whether you do or do not have OSA and how severe it is. Even, if you have mild OSA, I may want you to consider treatment with CPAP, as treatment of even borderline or mild sleep apnea can result and improvement of symptoms such as sleep disruption, daytime sleepiness, nighttime bathroom breaks, restless leg symptoms, improvement of headache syndromes, even improved mood disorder.   As explained, an attended sleep study (meaning you get to stay overnight in the sleep lab), lets us  monitor sleep-related behaviors such as sleep talking and leg movements in sleep, in addition to monitoring for sleep apnea.  A home sleep test is a screening tool for sleep apnea diagnosis only, but unfortunately, does not help with any other sleep-related diagnoses.  Please remember, the long-term risks and ramifications of untreated moderate to severe obstructive sleep apnea may include (but are not limited to): increased risk for cardiovascular disease, including congestive heart failure, stroke, difficult to control hypertension, treatment resistant obesity, arrhythmias, especially irregular heartbeat commonly known as A. Fib. (atrial fibrillation); even type 2 diabetes has been linked to untreated OSA.   Other correlations that untreated obstructive sleep apnea include macular edema which is swelling of the retina in the eyes, droopy eyelid syndrome, and elevated hemoglobin and hematocrit levels (often referred to as polycythemia).  Sleep apnea can cause disruption of sleep  and sleep deprivation in most cases, which, in turn, can cause recurrent headaches, problems with memory, mood, concentration, focus, and vigilance. Most people with untreated sleep apnea report excessive daytime sleepiness, which can affect their ability to drive. Please do not drive or use heavy equipment or machinery, if you feel sleepy! Patients with sleep apnea can also develop difficulty initiating and maintaining sleep (aka insomnia).   Having sleep apnea may increase your risk for other sleep disorders, including involuntary behaviors sleep such as sleep terrors, sleep talking, sleepwalking.    Having sleep apnea can also increase your risk for restless leg syndrome and leg movements at night.   Please note that untreated obstructive sleep apnea may carry additional perioperative morbidity. Patients with significant obstructive sleep apnea (typically, in the moderate to severe degree) should receive, if possible, perioperative PAP (positive airway pressure) therapy and the surgeons and particularly the anesthesiologists should be informed of the diagnosis and the severity of the sleep disordered breathing.   We will call you or email you through MyChart with regards to your test results and plan a follow-up in sleep clinic accordingly. Most likely, you will hear from one of our nurses.   Our sleep lab administrative assistant will call you to schedule your sleep study and give you further instructions, regarding the check in process for the sleep study, arrival time, what to bring, when you can expect to leave after the study, etc., and to answer any other logistical questions you may have. If you don't hear back from her by about 2 weeks from now, please feel free to call her direct line at [phone] or you can call our general clinic number,  or email us  through My Chart.

## 2024-09-13 ENCOUNTER — Telehealth: Payer: Self-pay | Admitting: Neurology

## 2024-09-13 NOTE — Telephone Encounter (Signed)
"   pt wcb to schedule  NPSG or HST [employer] medic[employer] no auth req  "

## 2024-10-01 ENCOUNTER — Ambulatory Visit: Payer: PRIVATE HEALTH INSURANCE | Admitting: Internal Medicine

## 2025-02-21 ENCOUNTER — Ambulatory Visit: Admitting: Adult Health
